# Patient Record
Sex: Male | Born: 1975 | Race: Black or African American | Hispanic: No | Smoking: Current every day smoker
Health system: Southern US, Community
[De-identification: ages and names within clinical notes are randomized; demographics above are authoritative.]

## PROBLEM LIST (undated history)

## (undated) DIAGNOSIS — I161 Hypertensive emergency: Secondary | ICD-10-CM

## (undated) DIAGNOSIS — R569 Unspecified convulsions: Secondary | ICD-10-CM

## (undated) DIAGNOSIS — I10 Essential (primary) hypertension: Secondary | ICD-10-CM

## (undated) DIAGNOSIS — I509 Heart failure, unspecified: Secondary | ICD-10-CM

## (undated) HISTORY — DX: Hypertensive emergency: I16.1

## (undated) HISTORY — DX: Heart failure, unspecified: I50.9

## (undated) NOTE — Telephone Encounter (Signed)
 Summary: Reschedule  Formatting of this note might be different from the original. LVM Req pt call back. Reschedule appt fromt 09/22/23 w/Dr. Gaylon to next avail Electronically signed by Loetta, Kodi at 08/18/2023  9:54 AM CDT

## (undated) NOTE — Result Encounter Note (Signed)
 Formatting of this note might be different from the original. Your TSH is low but T4 is normal we will keep monitoring on it during follow up visit.  Vitamin D is low- Take vitamin D 50,000 units one capsule by mouth every 7 days for 8 weeks.  Hemoglobin and red counts are mild low- keep visit for colonoscopy to rule out GI bleeding.  Blood counts, electrolytes, kidney function, blood sugar and cholesterol levels are within normal range. Continue current regimen of treatment. Electronically signed by Saintclair [place], NP at 04/28/2023  2:02 PM CDT

## (undated) NOTE — Progress Notes (Signed)
 Formatting of this note is different from the original. Images from the original note were not included. Patient ID: Joshua Mayo is a 16 y.o. male.  Assessment and Plan:   Joshua Mayo was seen today for establish care.  Diagnoses and all orders for this visit:  Encounter to establish care  Encounter for preventive health examination -     T4, Free; Future -     Thyroid  Stimulating Hormone; Future -     Vitamin D 25-Hydroxy; Future -     Hemoglobin A1c; Future -     Lipid Panel; Future -     Comprehensive Metabolic Panel; Future -     Complete Blood Count with Differential; Future -     Hepatitis C Antibody -     HIV 1/2 4th Generation; Future  Essential hypertension -     T4, Free; Future -     Thyroid  Stimulating Hormone; Future -     Hemoglobin A1c; Future -     Lipid Panel; Future -     Comprehensive Metabolic Panel; Future -     Complete Blood Count with Differential; Future -     Microalbumin w/Creat - Random; Future  Chronic congestive heart failure, unspecified heart failure type (HC Category) -     T4, Free; Future -     Thyroid  Stimulating Hormone; Future -     Hemoglobin A1c; Future -     Lipid Panel; Future -     Comprehensive Metabolic Panel; Future -     Complete Blood Count with Differential; Future -     Cardiology Referral  Depression with anxiety  Seizure disorder (HC Category)  Alcohol  use disorder  Screen for colon cancer -     Colonoscopy Referral  Wellness Visit New patient requiring medication refills and cardiology referral. Lab work needed for medication management. - Order lab work. - Provide referral to cardiology. - Schedule follow-up visit in 3 months.  Congestive Heart Failure Managed with carvedilol  and losartan . Requires cardiology follow-up for further management. - Provide referral to cardiology.  Hypertension Well-controlled with losartan , carvedilol , and potassium chloride .  Depression and Anxiety Managed with  escitalopram . No current symptoms.  Alcohol  Use Disorder Managed with Campral . Reduced consumption. Agreed to reduce medication dosage. - Reduce medication dosage to once a day. - Schedule follow-up visit in 3 months to reassess alcohol  use and medication needs.  Seizure Disorder Managed with levetiracetam . No recent seizures.  General Health Maintenance Due for colonoscopy. - Provide referral for colonoscopy.   PMSFSH reviewed  -  Handout(s) provided with written plan of care and discussed with emphasis on treatment plan, supportive care and s/sx to seek further medical/emergent care. Understanding verbalized  -  Medication use/effects reviewed. Understanding verbalized   - Call for worsening of symptoms or failure to improve  - Follow up pending labs/imaging.  Subjective:    History of Present Illness    Mr. Joshua Mayo, a new patient with a history of hypertension, congestive heart failure, depression, anxiety, alcohol  use disorder, and seizures, presents for establishing care, medication refills, and a referral to a cardiologist. He has recently moved to the area and is seeking a new primary care provider. He is currently on multiple medications including losartan  50mg  for hypertension, escitalopram  20mg  for depression and anxiety, carvedilol  25mg  twice daily for heart failure, potassium 10mEq daily, Cialis 5mg  daily, Campral  333mg  three times daily for alcohol  use disorder, and levetiracetam  500mg  twice daily for seizures. He has been on these medications for  approximately two years. He reports continued, albeit reduced, alcohol  consumption, with a few drinks on weekends. He has been diagnosed with heart failure for about four to five years and has been seeing a cardiologist for this condition. He has also been diagnosed with a seizure disorder for about one to two years.     No data to display      History reviewed. History reviewed.  No pertinent past medical history.  History  reviewed. No pertinent surgical history.  Social History   Tobacco Use   Smoking status: Every Day    Types: Cigarettes    Start date: 2000   Smokeless tobacco: Never  Substance Use Topics   Alcohol  use: Yes   Drug use: Never   History reviewed. No pertinent family history.  Current Outpatient Medications  Medication Sig   acamprosate  (Campral ) 333 MG enteric-coated tablet Take 666 mg by mouth 3 (three) times daily   carvedilol  (Coreg ) 25 MG tablet Take 25 mg by mouth 2 (two) times daily   escitalopram  (Lexapro ) 20 MG tablet Take 20 mg by mouth daily   levETIRAcetam  (Keppra ) 500 MG tablet Take 500 mg by mouth 2 (two) times daily   losartan  (Cozaar ) 50 MG tablet Take 50 mg by mouth daily   potassium chloride  (K-Dur) 10 MEQ controlled-release tablet Take 10 mEq by mouth daily   rosuvastatin  (Crestor ) 40 MG tablet Take 40 mg by mouth nightly   tadalafil (Cialis) 5 MG tablet Take 5 mg by mouth daily as needed for Erectile Dysfunction   Patient Active Problem List   Diagnosis Date Noted   Essential hypertension 04/27/2023   Depression with anxiety 04/27/2023   Seizure disorder (HC Category) 04/27/2023   Alcohol  use disorder 04/27/2023   Chronic congestive heart failure (HC Category) 04/27/2023   Objective:   BP 124/80 (BP Location: Right arm, Patient Position: Sitting)   Pulse 86   Resp 16   Ht 1.702 m (5' 7)   Wt 78 kg (172 lb)   SpO2 97%   BMI 26.94 kg/m   Physical Exam  Constitutional: He  is alert and is oriented to person, place, and time. He is well-developed and well-nourished.  HENT:  Head: Normocephalic and atraumatic.  Right Ear: External ear normal.  Left Ear: External ear normal.  Nose: Nose normal.  Mouth/Throat: Oropharynx is clear and moist.  Eyes: Pupils are equal, round, and reactive to light. Conjunctivae and EOM are normal. Right eye exhibits no discharge. Left eye exhibits no discharge.    Extraocular Movements: EOM normal.   Neck: Neck supple.   Cardiovascular: Normal rate, regular rhythm, normal heart sounds and intact distal pulses.  No murmur heard. Pulmonary/Chest: Effort normal and breath sounds normal. No respiratory distress.  Abdominal: Soft. Bowel sounds are normal. He exhibits no distension. There is no abdominal tenderness.  Musculoskeletal:        General: No tenderness or edema. Normal range of motion.     Cervical back: Normal range of motion.  Skin: Skin is warm. No rash noted. No erythema.  Psychiatric: He has a normal mood and affect. His behavior is normal. Judgment and thought content normal.  Nursing note and vitals reviewed.   Electronically signed by: Ashby Manas, NP Electronically signed by Manas Ashby, NP at 04/27/2023  8:59 AM CDT

---

## 2006-08-08 ENCOUNTER — Emergency Department: Payer: Self-pay | Admitting: Internal Medicine

## 2007-11-02 ENCOUNTER — Emergency Department: Payer: Self-pay | Admitting: Emergency Medicine

## 2008-07-18 ENCOUNTER — Ambulatory Visit: Payer: Self-pay | Admitting: Family Medicine

## 2015-12-30 ENCOUNTER — Telehealth: Payer: Self-pay

## 2015-12-30 NOTE — Telephone Encounter (Signed)
Patient's wife who is a patient here called and wanted to get patient re establish, his last visit with you was in 2013. Please call wife back with your decision. she knows you will be out of the office this week-aa [phone]-melissa Reder-wife

## 2016-01-02 NOTE — Telephone Encounter (Signed)
Appointment scheduled for 01/12/15@2 /MW

## 2016-01-02 NOTE — Telephone Encounter (Signed)
Please schedule.-aa 

## 2016-01-08 DIAGNOSIS — R0683 Snoring: Secondary | ICD-10-CM | POA: Insufficient documentation

## 2016-01-08 DIAGNOSIS — K219 Gastro-esophageal reflux disease without esophagitis: Secondary | ICD-10-CM | POA: Insufficient documentation

## 2016-01-08 DIAGNOSIS — K648 Other hemorrhoids: Secondary | ICD-10-CM | POA: Insufficient documentation

## 2016-01-12 ENCOUNTER — Ambulatory Visit (INDEPENDENT_AMBULATORY_CARE_PROVIDER_SITE_OTHER): Payer: BLUE CROSS/BLUE SHIELD | Admitting: Family Medicine

## 2016-01-12 ENCOUNTER — Encounter: Payer: Self-pay | Admitting: Family Medicine

## 2016-01-12 VITALS — BP 128/90 | HR 97 | Temp 98.5°F | Resp 16 | Ht 67.0 in | Wt 180.8 lb

## 2016-01-12 DIAGNOSIS — R0683 Snoring: Secondary | ICD-10-CM

## 2016-01-12 DIAGNOSIS — R945 Abnormal results of liver function studies: Secondary | ICD-10-CM | POA: Diagnosis not present

## 2016-01-12 DIAGNOSIS — R5383 Other fatigue: Secondary | ICD-10-CM

## 2016-01-12 DIAGNOSIS — Z0000 Encounter for general adult medical examination without abnormal findings: Secondary | ICD-10-CM | POA: Diagnosis not present

## 2016-01-12 DIAGNOSIS — K219 Gastro-esophageal reflux disease without esophagitis: Secondary | ICD-10-CM | POA: Diagnosis not present

## 2016-01-12 DIAGNOSIS — R5381 Other malaise: Secondary | ICD-10-CM | POA: Diagnosis not present

## 2016-01-12 NOTE — Progress Notes (Signed)
Patient: Joshua Mayo Male    DOB: 1975/10/04   41 y.o.   MRN: [id number] Visit Date: 01/12/2016  Today's Provider: Dortha Kernennis Ayjah Show, PA   Chief Complaint  Patient presents with  . Establish Care   Subjective:    HPI Joshua Mayo is a 41 year old male who presents today to Re-Establish Care as a new patient. Patient's only concerns is difficulty sleeping, night sweats, chills, and body aches. Onset 2 months of intermittent symptoms until last week.   No past medical history on file. Patient Active Problem List   Diagnosis Date Noted  . Esophageal reflux 01/08/2016  . Internal hemorrhoids without complication 01/08/2016  . Snoring 01/08/2016   No past surgical history on file. Family History  Problem Relation Age of Onset  . Heart disease Father   . Heart disease Paternal Grandfather    No Known Allergies   Previous Medications   CHLORHEXIDINE (PERIDEX) 0.12 % SOLUTION    USE TWICE A DAY AS DIRECTED   NAPROXEN SODIUM (ANAPROX) 550 MG TABLET    Take 550 mg by mouth 2 (two) times daily as needed.   TRIAZOLAM (HALCION) 0.25 MG TABLET    TAKE 1 TABLET BY MOUTH 1 HOUR PRIOR TO PROCEDURE-SAVE 2ND TAB FOR NEXT PROCEDURE    Review of Systems  Constitutional: Positive for chills and diaphoresis.  Eyes: Negative.   Respiratory: Negative.   Musculoskeletal: Positive for myalgias.    Social History  Substance Use Topics  . Smoking status: Current Every Day Smoker    Packs/day: 1.00    Years: 20.00    Types: Cigarettes  . Smokeless tobacco: Never Used  . Alcohol use Yes     Comment: weekends   Objective:   BP 128/90 (BP Location: Right Arm, Patient Position: Sitting, Cuff Size: Normal)   Pulse 97   Temp 98.5 F (36.9 C) (Oral)   Resp 16   Ht 5\' 7"  (1.702 m)   Wt 180 lb 12.8 oz (82 kg)   SpO2 99%   BMI 28.32 kg/m   Physical Exam  Constitutional: He is oriented to person, place, and time. He appears well-developed and well-nourished.  HENT:  Head: Normocephalic  and atraumatic.  Right Ear: External ear normal.  Left Ear: External ear normal.  Nose: Nose normal.  Mouth/Throat: Oropharynx is clear and moist.  Recent gum surgery has left him with swelling of the right lower jaw.  Eyes: Conjunctivae and EOM are normal. Pupils are equal, round, and reactive to light. Right eye exhibits no discharge.  Neck: Normal range of motion. Neck supple. No tracheal deviation present. No thyromegaly present.  Cardiovascular: Normal rate, regular rhythm, normal heart sounds and intact distal pulses.   No murmur heard. Pulmonary/Chest: Effort normal and breath sounds normal. No respiratory distress. He has no wheezes. He has no rales. He exhibits no tenderness.  Abdominal: Soft. He exhibits no distension and no mass. There is no tenderness. There is no rebound and no guarding.  Musculoskeletal: Normal range of motion. He exhibits no edema or tenderness.  Lymphadenopathy:    He has no cervical adenopathy.  Neurological: He is alert and oriented to person, place, and time. He has normal reflexes. No cranial nerve deficit. He exhibits normal muscle tone. Coordination normal.  Skin: Skin is warm and dry. No rash noted. No erythema.  Psychiatric: He has a normal mood and affect. His behavior is normal. Judgment and thought content normal.      Assessment & Plan:  1. Annual physical exam Other than recent malaise and fatigue, general health stable. Exam to re-establish care. Had recent oral surgery for gum disease. Declines tetanus booster. Check routine labs. - CBC with Differential/Platelet - Comprehensive metabolic panel - Lipid panel - TSH  2. Malaise and fatigue Onset over the past month with sweats, chills, body aches and fatigue. No fever today. Will get routine labs. Recheck pending reports. - CBC with Differential/Platelet - Comprehensive metabolic panel - Lipid panel - TSH  3. Gastroesophageal reflux disease, esophagitis presence not specified No  hematemesis or hematochezia. Continue to have dyspepsia that is relieved by daily use of Prilosec. May need further evaluation or upper endoscopy. Will check labs and follow up pending reports. Given counseling regarding reflux precautions. - CBC with Differential/Platelet - Comprehensive metabolic panel  4. Snoring States he has had snoring issue for a few years. Will give Epworth Sleepiness Scale questionnaire to take home and fill out with his wife. May need sleep study.

## 2016-01-13 ENCOUNTER — Telehealth: Payer: Self-pay

## 2016-01-13 LAB — LIPID PANEL
Chol/HDL Ratio: 3 ratio units (ref 0.0–5.0)
Cholesterol, Total: 232 mg/dL — ABNORMAL HIGH (ref 100–199)
HDL: 77 mg/dL (ref >39)
LDL Calculated: 144 mg/dL — ABNORMAL HIGH (ref 0–99)
Triglycerides: 56 mg/dL (ref 0–149)
VLDL CHOLESTEROL CAL: 11 mg/dL (ref 5–40)

## 2016-01-13 LAB — COMPREHENSIVE METABOLIC PANEL
A/G RATIO: 1.8 (ref 1.2–2.2)
ALT: 201 IU/L — ABNORMAL HIGH (ref 0–44)
AST: 85 IU/L — ABNORMAL HIGH (ref 0–40)
Albumin: 4.5 g/dL (ref 3.5–5.5)
Alkaline Phosphatase: 65 IU/L (ref 39–117)
BILIRUBIN TOTAL: 0.5 mg/dL (ref 0.0–1.2)
BUN / CREAT RATIO: 16 (ref 9–20)
BUN: 13 mg/dL (ref 6–24)
CALCIUM: 9.3 mg/dL (ref 8.7–10.2)
CO2: 21 mmol/L (ref 18–29)
Chloride: 98 mmol/L (ref 96–106)
Creatinine, Ser: 0.8 mg/dL (ref 0.76–1.27)
GFR calc non Af Amer: 110 mL/min/{1.73_m2} (ref >59)
GFR, EST AFRICAN AMERICAN: 127 mL/min/{1.73_m2} (ref >59)
Globulin, Total: 2.5 g/dL (ref 1.5–4.5)
Glucose: 69 mg/dL (ref 65–99)
POTASSIUM: 4.5 mmol/L (ref 3.5–5.2)
Sodium: 140 mmol/L (ref 134–144)
TOTAL PROTEIN: 7 g/dL (ref 6.0–8.5)

## 2016-01-13 LAB — CBC WITH DIFFERENTIAL/PLATELET
BASOS: 1 %
Basophils Absolute: 0.1 10*3/uL (ref 0.0–0.2)
EOS (ABSOLUTE): 0.1 10*3/uL (ref 0.0–0.4)
Eos: 1 %
HEMOGLOBIN: 13.4 g/dL (ref 13.0–17.7)
Hematocrit: 41.7 % (ref 37.5–51.0)
IMMATURE GRANS (ABS): 0 10*3/uL (ref 0.0–0.1)
IMMATURE GRANULOCYTES: 0 %
LYMPHS: 40 %
Lymphocytes Absolute: 2.3 10*3/uL (ref 0.7–3.1)
MCH: 28.2 pg (ref 26.6–33.0)
MCHC: 32.1 g/dL (ref 31.5–35.7)
MCV: 88 fL (ref 79–97)
MONOCYTES: 11 %
Monocytes Absolute: 0.6 10*3/uL (ref 0.1–0.9)
NEUTROS ABS: 2.7 10*3/uL (ref 1.4–7.0)
NEUTROS PCT: 47 %
Platelets: 266 10*3/uL (ref 150–379)
RBC: 4.75 x10E6/uL (ref 4.14–5.80)
RDW: 13.6 % (ref 12.3–15.4)
WBC: 5.8 10*3/uL (ref 3.4–10.8)

## 2016-01-13 LAB — TSH: TSH: 0.8 u[IU]/mL (ref 0.450–4.500)

## 2016-01-13 NOTE — Telephone Encounter (Signed)
Added on hepatitis panel. LM for Mr. Iona BeardHaith to call back. Allene DillonEmily Drozdowski, CMA

## 2016-01-13 NOTE — Telephone Encounter (Signed)
-----   Message from Tamsen Roersennis E Chrismon, [place] sent at 01/13/2016  8:51 AM EST ----- Cholesterol and liver enzymes elevated. Recommend low fat diet and ask lab to run hepatitis panel.

## 2016-01-13 NOTE — Telephone Encounter (Signed)
Advised pt of lab results. Pt verbally acknowledges understanding. [name], CMA   

## 2016-01-14 LAB — HEPATITIS PANEL, ACUTE
HEP A IGM: NEGATIVE
HEP B C IGM: NEGATIVE
HEP B S AG: NEGATIVE

## 2016-01-14 LAB — SPECIMEN STATUS REPORT

## 2016-07-30 ENCOUNTER — Encounter: Payer: Self-pay | Admitting: Family Medicine

## 2016-07-30 ENCOUNTER — Ambulatory Visit (INDEPENDENT_AMBULATORY_CARE_PROVIDER_SITE_OTHER): Payer: BLUE CROSS/BLUE SHIELD | Admitting: Family Medicine

## 2016-07-30 VITALS — BP 132/94 | HR 100 | Temp 98.6°F | Resp 16 | Wt 188.0 lb

## 2016-07-30 DIAGNOSIS — H6123 Impacted cerumen, bilateral: Secondary | ICD-10-CM

## 2016-07-30 MED ORDER — NEOMYCIN-POLYMYXIN-HC 3.5-10000-1 OT SUSP: 3.0000 [drp] | Freq: Three times a day (TID) | OTIC | 0 refills | Status: DC

## 2016-07-30 NOTE — Progress Notes (Signed)
Patient: Joshua Mayo Male    DOB: 04/06/1975   41 y.o.   MRN: [id number] Visit Date: 07/30/2016  Today's Provider: Dortha Kernennis Chrismon, PA   Chief Complaint  Patient presents with  . Ear Problem   Subjective:    HPI   Ear Problem Pt is c/o bilateral ear itchiness for the last month. His ears are also draining and are painful. He has tried OTC ear drops, without relief. He denies hearing loss, vertigo. He admits he uses pens, paperclips, q-tips to scratch ears.  Patient Active Problem List   Diagnosis Date Noted  . Esophageal reflux 01/08/2016  . Internal hemorrhoids without complication 01/08/2016  . Snoring 01/08/2016   History reviewed. No pertinent surgical history.  Family History  Problem Relation Age of Onset  . Heart disease Father   . Heart disease Paternal Grandfather    No Known Allergies  Current Outpatient Prescriptions:  .  omeprazole (PRILOSEC) 20 MG capsule, Take 20 mg by mouth daily., Disp: , Rfl:   Review of Systems  Constitutional: Negative for activity change, appetite change, chills, diaphoresis, fatigue, fever and unexpected weight change.  HENT: Positive for ear discharge and ear pain. Negative for tinnitus.     Social History  Substance Use Topics  . Smoking status: Current Every Day Smoker    Packs/day: 1.00    Years: 20.00    Types: Cigarettes  . Smokeless tobacco: Never Used  . Alcohol use Yes     Comment: weekends   Objective:   BP (!) 132/94 (BP Location: Right Arm, Patient Position: Sitting, Cuff Size: Normal)   Pulse 100   Temp 98.6 F (37 C) (Oral)   Resp 16   Wt 188 lb (85.3 kg)   BMI 29.44 kg/m  Vitals:   07/30/16 1606  BP: (!) 132/94  Pulse: 100  Resp: 16  Temp: 98.6 F (37 C)  TempSrc: Oral  Weight: 188 lb (85.3 kg)     Physical Exam  Constitutional: He is oriented to person, place, and time. He appears well-developed and well-nourished. No distress.  HENT:  Head: Normocephalic and atraumatic.    Right Ear: Hearing normal.  Left Ear: Hearing normal.  Nose: Nose normal.  Both ear canals have a large amount of cerumen. No drainage at the present and no pain or hearing loss.  Eyes: Conjunctivae and lids are normal. Right eye exhibits no discharge. Left eye exhibits no discharge. No scleral icterus.  Neck: Neck supple.  Cardiovascular: Normal rate and regular rhythm.   Pulmonary/Chest: Effort normal and breath sounds normal. No respiratory distress.  Abdominal: Soft.  Musculoskeletal: Normal range of motion.  Lymphadenopathy:    He has no cervical adenopathy.  Neurological: He is alert and oriented to person, place, and time.  Skin: Skin is intact. No lesion and no rash noted.  Psychiatric: He has a normal mood and affect. His speech is normal and behavior is normal. Thought content normal.      Assessment & Plan:     1. Bilateral impacted cerumen Itching sensation both ears over the past month. Large amount of cerumen in both ear canals. Irrigated clear of wax. Given Cortisporin Otic drops for irritation and itching for 4 days. Moderate amount of cerumen remains in the left ear canal. May have to irrigate again if not cleared by the first of the week.  - neomycin-polymyxin-hydrocortisone (CORTISPORIN) 3.5-10000-1 OTIC suspension; Place 3 drops into both ears 3 (three) times daily.  Dispense:  10 mL; Refill: 0 .        Dortha Kernennis Chrismon, PA  [facility]

## 2016-09-08 DIAGNOSIS — J019 Acute sinusitis, unspecified: Secondary | ICD-10-CM | POA: Diagnosis not present

## 2017-01-07 ENCOUNTER — Encounter: Payer: Self-pay | Admitting: Family Medicine

## 2017-01-07 ENCOUNTER — Ambulatory Visit: Payer: BLUE CROSS/BLUE SHIELD | Admitting: Family Medicine

## 2017-01-07 VITALS — BP 158/96 | HR 97 | Temp 98.4°F | Wt 187.4 lb

## 2017-01-07 DIAGNOSIS — M7021 Olecranon bursitis, right elbow: Secondary | ICD-10-CM

## 2017-01-07 MED ORDER — METHYLPREDNISOLONE ACETATE 40 MG/ML IJ SUSP: 40.0000 mg | Freq: Once | INTRAMUSCULAR | Status: DC

## 2017-01-07 MED ORDER — CEPHALEXIN 500 MG PO CAPS: 500.0000 mg | ORAL_CAPSULE | Freq: Two times a day (BID) | ORAL | 0 refills | Status: DC

## 2017-01-07 NOTE — Addendum Note (Signed)
Addended by: Dortha KernHRISMON, Will Heinkel E on: 01/07/2017 11:24 AM   Modules accepted: Orders

## 2017-01-07 NOTE — Addendum Note (Signed)
Addended by: Dortha KernHRISMON, DENNIS E on: 01/07/2017 05:16 PM   Modules accepted: Orders

## 2017-01-07 NOTE — Progress Notes (Signed)
Patient: Joshua Mayo Male    DOB: 1975/11/06   42 y.o.   MRN: [id number] Visit Date: 01/07/2017  Today's Provider: Dortha Kernennis Jahmeir Geisen, PA   Chief Complaint  Patient presents with  . Bursitis right elbow   Subjective:    HPI  Elbow Pain   The pain is present in the right elbow. Quality: soreness. Associated symptoms comments: swelling. Exacerbated by: fell approximately 2 months ago, and states he uses a computer all day which applies pressure to his elbows. Treatments tried: Patient states he attempted to drain fluid him self at home with a diabetic needle. The treatment provided no relief. He has a PMH of bursitis, but denies having fluid drained previously.   No past medical history on file. Patient Active Problem List   Diagnosis Date Noted  . Esophageal reflux 01/08/2016  . Internal hemorrhoids without complication 01/08/2016  . Snoring 01/08/2016   No past surgical history on file. Family History  Problem Relation Age of Onset  . Heart disease Father   . Heart disease Paternal Grandfather    No Known Allergies  Current Outpatient Medications:  .  omeprazole (PRILOSEC) 20 MG capsule, Take 20 mg by mouth daily., Disp: , Rfl:   Review of Systems  Constitutional: Negative.   Respiratory: Negative.   Cardiovascular: Negative.   Musculoskeletal:       Right elbow pain and swelling    Social History   Tobacco Use  . Smoking status: Current Every Day Smoker    Packs/day: 1.00    Years: 20.00    Pack years: 20.00    Types: Cigarettes  . Smokeless tobacco: Never Used  Substance Use Topics  . Alcohol use: Yes    Comment: weekends   Objective:   BP (!) 158/96 (BP Location: Right Arm, Patient Position: Sitting, Cuff Size: Normal)   Pulse 97   Temp 98.4 F (36.9 C) (Oral)   Wt 187 lb 6.4 oz (85 kg)   SpO2 98%   BMI 29.35 kg/m    Physical Exam  Constitutional: He is oriented to person, place, and time. He appears well-developed and well-nourished. No  distress.  HENT:  Head: Normocephalic and atraumatic.  Right Ear: Hearing normal.  Left Ear: Hearing normal.  Nose: Nose normal.  Eyes: Conjunctivae and lids are normal. Right eye exhibits no discharge. Left eye exhibits no discharge. No scleral icterus.  Pulmonary/Chest: Effort normal. No respiratory distress.  Musculoskeletal: Normal range of motion.  Large fluctuant right olecranon bursa with scab centrally. FROM of joints without significant discomfort.  Neurological: He is alert and oriented to person, place, and time.  Skin: Skin is intact. No lesion and no rash noted.  Psychiatric: He has a normal mood and affect. His speech is normal and behavior is normal. Thought content normal.      Assessment & Plan:     1. Olecranon bursitis of right elbow Larey SeatFell off a bed playing with daughter and landed on the right elbow a month ago. Swelling persisted and tried to use a diabetic insulin syringe to aspirate at home. Has a scab over the olecranon process with large swelling of the bursa. No significant discomfort today. No fevers. Joint aspiration with 18 gauge needle produced yellow fluid with pink tinge. Will send fluid to cytology and given Keflex 500 mg BID since he attempted aspiration at home. Recheck in 1 week. Used Coban compression bandage after aspiration to prevent recurrence of fluid collection. Instilled 40 mg  DepoMedrol to bursa. - Joint aspiration, elbow        Dortha Kern, PA  [facility]

## 2017-01-10 LAB — CYTOLOGY - NON PAP

## 2017-01-11 ENCOUNTER — Telehealth: Payer: Self-pay

## 2017-01-11 NOTE — Telephone Encounter (Signed)
Patient advised.

## 2017-01-11 NOTE — Telephone Encounter (Signed)
-----   Message from Tamsen Roersennis E Chrismon, [place] sent at 01/10/2017  4:45 PM EST ----- Cytology report of bursa fluid was negative for infection or malignant cells. Recheck 01-14-17 as planned.

## 2017-01-14 ENCOUNTER — Ambulatory Visit: Payer: BLUE CROSS/BLUE SHIELD | Admitting: Family Medicine

## 2017-01-14 ENCOUNTER — Encounter: Payer: Self-pay | Admitting: Family Medicine

## 2017-01-14 VITALS — BP 128/86 | HR 88 | Temp 98.4°F | Wt 183.6 lb

## 2017-01-14 DIAGNOSIS — M7021 Olecranon bursitis, right elbow: Secondary | ICD-10-CM

## 2017-01-14 NOTE — Progress Notes (Signed)
    Patient: Joshua Mayo Male    DOB: 1975-04-12   42 y.o.   MRN: [id number] Visit Date: 01/14/2017  Today's Provider: Dortha Kernennis Chrismon, PA   Chief Complaint  Patient presents with  . bursitis follow up   Subjective:    HPI Olecranon bursitis of right elbow: Patient presents for a 1 week follow up. Last OV was on 01/07/17. Right elbow was aspirated. Cytology report was negative for bacteria. Patient was advised to complete Keflex 500 mg BID. He reports good compliance with treatment plan. Symptoms have significantly improved. He states the Keflex is causing diarrhea.     No Known Allergies   Current Outpatient Medications:  .  cephALEXin (KEFLEX) 500 MG capsule, Take 1 capsule (500 mg total) by mouth 2 (two) times daily., Disp: 14 capsule, Rfl: 0 .  omeprazole (PRILOSEC) 20 MG capsule, Take 20 mg by mouth daily., Disp: , Rfl:   Current Facility-Administered Medications:  .  methylPREDNISolone acetate (DEPO-MEDROL) injection 40 mg, 40 mg, Intra-Lesional, Once, Chrismon, Jodell Ciproennis E, PA    Review of Systems  Constitutional: Negative.   Respiratory: Negative.   Cardiovascular: Negative.    Social History   Tobacco Use  . Smoking status: Current Every Day Smoker    Packs/day: 1.00    Years: 20.00    Pack years: 20.00    Types: Cigarettes  . Smokeless tobacco: Never Used  Substance Use Topics  . Alcohol use: Yes    Comment: weekends   Objective:   BP 128/86 (BP Location: Right Arm, Patient Position: Sitting, Cuff Size: Normal)   Pulse 88   Temp 98.4 F (36.9 C) (Oral)   Wt 183 lb 9.6 oz (83.3 kg)   SpO2 99%   BMI 28.76 kg/m    Physical Exam  Constitutional: He appears well-developed and well-nourished.  Musculoskeletal: Normal range of motion.  Minimal soreness remains over the right olecranon process. No swelling or redness. Full ROM and good grip strength. No lymphangitis.      Assessment & Plan:     1. Olecranon bursitis of right elbow Cytology report  negative for infection or malignant cells. Swelling has resolved. May stop the Keflex and recheck as needed.      Dortha Kernennis Chrismon, PA  [facility]

## 2017-03-18 DIAGNOSIS — H60333 Swimmer's ear, bilateral: Secondary | ICD-10-CM | POA: Diagnosis not present

## 2017-03-18 DIAGNOSIS — H6122 Impacted cerumen, left ear: Secondary | ICD-10-CM | POA: Diagnosis not present

## 2017-04-11 DIAGNOSIS — H60339 Swimmer's ear, unspecified ear: Secondary | ICD-10-CM | POA: Diagnosis not present

## 2017-05-13 DIAGNOSIS — H18823 Corneal disorder due to contact lens, bilateral: Secondary | ICD-10-CM | POA: Diagnosis not present

## 2017-05-24 DIAGNOSIS — H18211 Corneal edema secondary to contact lens, right eye: Secondary | ICD-10-CM | POA: Diagnosis not present

## 2017-09-13 DIAGNOSIS — L02811 Cutaneous abscess of head [any part, except face]: Secondary | ICD-10-CM | POA: Diagnosis not present

## 2017-10-12 DIAGNOSIS — J312 Chronic pharyngitis: Secondary | ICD-10-CM | POA: Diagnosis not present

## 2017-10-29 ENCOUNTER — Emergency Department
Admission: EM | Admit: 2017-10-29 | Discharge: 2017-10-30 | Disposition: A | Discharge status: Home / Self Care | Payer: BLUE CROSS/BLUE SHIELD | Attending: Emergency Medicine | Admitting: Emergency Medicine

## 2017-10-29 ENCOUNTER — Encounter: Payer: Self-pay | Admitting: Emergency Medicine

## 2017-10-29 ENCOUNTER — Emergency Department: Payer: BLUE CROSS/BLUE SHIELD

## 2017-10-29 DIAGNOSIS — K760 Fatty (change of) liver, not elsewhere classified: Secondary | ICD-10-CM | POA: Diagnosis not present

## 2017-10-29 DIAGNOSIS — R109 Unspecified abdominal pain: Secondary | ICD-10-CM | POA: Diagnosis not present

## 2017-10-29 DIAGNOSIS — R1031 Right lower quadrant pain: Secondary | ICD-10-CM | POA: Diagnosis not present

## 2017-10-29 DIAGNOSIS — F17210 Nicotine dependence, cigarettes, uncomplicated: Secondary | ICD-10-CM | POA: Diagnosis not present

## 2017-10-29 DIAGNOSIS — K5730 Diverticulosis of large intestine without perforation or abscess without bleeding: Secondary | ICD-10-CM | POA: Diagnosis not present

## 2017-10-29 DIAGNOSIS — Z79899 Other long term (current) drug therapy: Secondary | ICD-10-CM | POA: Diagnosis not present

## 2017-10-29 LAB — CBC
HEMATOCRIT: 42.9 % (ref 39.0–52.0)
HEMOGLOBIN: 14.1 g/dL (ref 13.0–17.0)
MCH: 29.7 pg (ref 26.0–34.0)
MCHC: 32.9 g/dL (ref 30.0–36.0)
MCV: 90.3 fL (ref 80.0–100.0)
NRBC: 0 % (ref 0.0–0.2)
PLATELETS: 210 10*3/uL (ref 150–400)
RBC: 4.75 MIL/uL (ref 4.22–5.81)
RDW: 12.4 % (ref 11.5–15.5)
WBC: 6.7 10*3/uL (ref 4.0–10.5)

## 2017-10-29 LAB — TROPONIN I: Troponin I: <0.03 ng/mL (ref <0.03)

## 2017-10-29 LAB — COMPREHENSIVE METABOLIC PANEL
ALBUMIN: 4.9 g/dL (ref 3.5–5.0)
ALT: 138 U/L — AB (ref 0–44)
AST: 89 U/L — ABNORMAL HIGH (ref 15–41)
Alkaline Phosphatase: 56 U/L (ref 38–126)
Anion gap: 16 — ABNORMAL HIGH (ref 5–15)
BUN: 12 mg/dL (ref 6–20)
CALCIUM: 9.9 mg/dL (ref 8.9–10.3)
CHLORIDE: 104 mmol/L (ref 98–111)
CO2: 22 mmol/L (ref 22–32)
CREATININE: 1.16 mg/dL (ref 0.61–1.24)
GFR calc Af Amer: >60 mL/min (ref >60)
GFR calc non Af Amer: >60 mL/min (ref >60)
GLUCOSE: 109 mg/dL — AB (ref 70–99)
Potassium: 3.8 mmol/L (ref 3.5–5.1)
SODIUM: 142 mmol/L (ref 135–145)
Total Bilirubin: 0.7 mg/dL (ref 0.3–1.2)
Total Protein: 8.4 g/dL — ABNORMAL HIGH (ref 6.5–8.1)

## 2017-10-29 LAB — ETHANOL: Alcohol, Ethyl (B): 25 mg/dL — ABNORMAL HIGH (ref <10)

## 2017-10-29 LAB — LIPASE, BLOOD: LIPASE: 40 U/L (ref 11–51)

## 2017-10-29 MED ORDER — FENTANYL CITRATE (PF) 100 MCG/2ML IJ SOLN
50.0000 ug | INTRAMUSCULAR | Status: DC | PRN
  Administered 2017-10-29: 50 ug via INTRAVENOUS
  Filled 2017-10-29: qty 2

## 2017-10-29 MED ORDER — CYCLOBENZAPRINE HCL 5 MG PO TABS: 5.0000 mg | ORAL_TABLET | Freq: Three times a day (TID) | ORAL | 0 refills | Status: DC | PRN

## 2017-10-29 MED ORDER — IOPAMIDOL (ISOVUE-370) INJECTION 76%
100.0000 mL | Freq: Once | INTRAVENOUS | Status: AC | PRN
  Administered 2017-10-29: 100 mL via INTRAVENOUS

## 2017-10-29 MED ORDER — ONDANSETRON HCL 4 MG/2ML IJ SOLN
4.0000 mg | Freq: Once | INTRAMUSCULAR | Status: AC
  Administered 2017-10-29: 4 mg via INTRAVENOUS
  Filled 2017-10-29: qty 2

## 2017-10-29 MED ORDER — MORPHINE SULFATE (PF) 4 MG/ML IV SOLN
4.0000 mg | Freq: Once | INTRAVENOUS | Status: AC
  Administered 2017-10-29: 4 mg via INTRAVENOUS
  Filled 2017-10-29: qty 1

## 2017-10-29 MED ORDER — SODIUM CHLORIDE 0.9 % IV BOLUS
1000.0000 mL | Freq: Once | INTRAVENOUS | Status: AC
  Administered 2017-10-29: 1000 mL via INTRAVENOUS

## 2017-10-29 NOTE — ED Notes (Signed)
ED Provider at bedside. 

## 2017-10-29 NOTE — ED Notes (Signed)
Patient returned from CT

## 2017-10-29 NOTE — Discharge Instructions (Signed)

## 2017-10-29 NOTE — ED Triage Notes (Addendum)
Pt arrives with complaints of gradual onset of lower right abdominal pain that started roughly 1 hour prior to arrival. Pt states the pain feels like throbbing. Pt very uncomfortable in triage. Pt denies and chest pain or shortness of breath

## 2017-10-29 NOTE — ED Provider Notes (Signed)
[facility] Emergency Department Provider Note  ____________________________________________  Time seen: Approximately 9:46 PM  I have reviewed the triage vital signs and the nursing notes.   HISTORY  Chief Complaint Abdominal Pain   HPI Joshua Mayo is a 42 y.o. male with history of alcohol abuse and GERD who presents for evaluation of abdominal pain.  He reports that the pain started mild this morning located in his right flank and throughout the day the pain got progressively worse and now radiates down his groin and his abdomen as well.  The pain is sharp and constant, associated with nausea but no vomiting, currently 7/10.  Pain is worse with movement of torso, stretching of the R leg.  No fever or chills, no dysuria or hematuria.  No prior history of kidney stones.  Patient drinks 1/5 of liquor a night for several years.  No known history of cirrhosis.  Last drink according to patient was last night.  Denies drinking today.   Patient Active Problem List   Diagnosis Date Noted  . Esophageal reflux 01/08/2016  . Internal hemorrhoids without complication 45/03/8880  . Snoring 01/08/2016    History reviewed. No pertinent surgical history.  Prior to Admission medications   Medication Sig Start Date End Date Taking? Authorizing Provider  cephALEXin (KEFLEX) 500 MG capsule Take 1 capsule (500 mg total) by mouth 2 (two) times daily. 01/07/17   Chrismon, Vickki Muff, PA  cyclobenzaprine (FLEXERIL) 5 MG tablet Take 1 tablet (5 mg total) by mouth 3 (three) times daily as needed for muscle spasms. 10/29/17   Alfred Levins, [place], MD  omeprazole (PRILOSEC) 20 MG capsule Take 20 mg by mouth daily.    [provider]    Allergies Patient has no known allergies.  Family History  Problem Relation Age of Onset  . Heart disease Father   . Heart disease Paternal Grandfather     Social History Social History   Tobacco Use  . Smoking status: Current  Every Day Smoker    Packs/day: 1.00    Years: 20.00    Pack years: 20.00    Types: Cigarettes  . Smokeless tobacco: Never Used  Substance Use Topics  . Alcohol use: Yes    Comment: weekends  . Drug use: No    Types: Cocaine    Comment: previously used cocaine. Pt states he stopped on his own with the help of his current wife     Review of Systems  Constitutional: Negative for fever. Eyes: Negative for visual changes. ENT: Negative for sore throat. Neck: No neck pain  Cardiovascular: Negative for chest pain. Respiratory: Negative for shortness of breath. Gastrointestinal: + R sided abdominal pain and nausea. No vomiting or diarrhea. Genitourinary: Negative for dysuria. + R flank pain Musculoskeletal: Negative for back pain. Skin: Negative for rash. Neurological: Negative for headaches, weakness or numbness. Psych: No SI or HI  ____________________________________________   PHYSICAL EXAM:  VITAL SIGNS: ED Triage Vitals  Enc Vitals Group     BP 10/29/17 1613 (!) 190/116     Pulse Rate 10/29/17 1613 (!) 122     Resp 10/29/17 1613 18     Temp 10/29/17 1613 97.6 F (36.4 C)     Temp Source 10/29/17 1613 Oral     SpO2 10/29/17 1613 100 %     Weight 10/29/17 1610 185 lb (83.9 kg)     Height 10/29/17 1610 '5\' 7"'  (1.702 m)     Head Circumference --  Peak Flow --      Pain Score 10/29/17 1610 10     Pain Loc --      Pain Edu? --      Excl. in [place]? --     Constitutional: Alert and oriented, patient is shaking, looks in significant distress due to pain. HEENT:      Head: Normocephalic and atraumatic.         Eyes: Conjunctivae are normal. Sclera is non-icteric.       Mouth/Throat: Mucous membranes are moist.       Neck: Supple with no signs of meningismus. Cardiovascular: Tachycardic with regular rhythm. No murmurs, gallops, or rubs. 2+ symmetrical distal pulses are present in all extremities. No JVD. Respiratory: Normal respiratory effort. Lungs are clear to  auscultation bilaterally. No wheezes, crackles, or rhonchi.  Gastrointestinal: Soft, non tender, and non distended with positive bowel sounds. No rebound or guarding. Genitourinary: No CVA tenderness. Musculoskeletal: Nontender with normal range of motion in all extremities. No edema, cyanosis, or erythema of extremities. Neurologic: Normal speech and language. Face is symmetric. Moving all extremities. No gross focal neurologic deficits are appreciated. Skin: Skin is warm, dry and intact. No rash noted. Psychiatric: Mood and affect are normal. Speech and behavior are normal.  ____________________________________________   LABS (all labs ordered are listed, but only abnormal results are displayed)  Labs Reviewed  COMPREHENSIVE METABOLIC PANEL - Abnormal; Notable for the following components:      Result Value   Glucose, Bld 109 (*)    Total Protein 8.4 (*)    AST 89 (*)    ALT 138 (*)    Anion gap 16 (*)    All other components within normal limits  ETHANOL - Abnormal; Notable for the following components:   Alcohol, Ethyl (B) 25 (*)    All other components within normal limits  LIPASE, BLOOD  CBC  TROPONIN I  URINALYSIS, COMPLETE (UACMP) WITH MICROSCOPIC   ____________________________________________  EKG  ED ECG REPORT I, Rudene Re, the attending physician, personally viewed and interpreted this ECG.  Sinus tachycardia, rate of 117, LVH, normal intervals, normal axis, T wave inversions in inferior and lateral leads with no ST elevation.  No Significant changes when compared to prior from 2009 ____________________________________________  RADIOLOGY  I have personally reviewed the images performed during this visit and I agree with the Radiologist's read.   Interpretation by Radiologist:  Ct Angio Abd/pel W And/or Wo Contrast  Result Date: 10/29/2017 CLINICAL DATA:  Complaint of gradual onset of lower right abdominal pain starting 1 hour prior to arrival. Pain  felt like throbbing. EXAM: CTA ABDOMEN AND PELVIS wITHOUT AND WITH CONTRAST TECHNIQUE: Multidetector CT imaging of the abdomen and pelvis was performed using the standard protocol during bolus administration of intravenous contrast. Multiplanar reconstructed images and MIPs were obtained and reviewed to evaluate the vascular anatomy. CONTRAST:  120m ISOVUE-370 IOPAMIDOL (ISOVUE-370) INJECTION 76% COMPARISON:  11/02/2007 CT FINDINGS: VASCULAR Aorta: Normal caliber aorta without aneurysm, dissection, vasculitis or significant stenosis. Celiac: Patent without evidence of aneurysm, dissection, vasculitis or significant stenosis. SMA: Patent without evidence of aneurysm, dissection, vasculitis or significant stenosis. Renals: Both renal arteries are patent without evidence of aneurysm, dissection, vasculitis, fibromuscular dysplasia or significant stenosis. IMA: Patent without evidence of aneurysm, dissection, vasculitis or significant stenosis. Inflow: Patent without evidence of aneurysm, dissection, vasculitis or significant stenosis. Proximal Outflow: Bilateral common femoral and visualized portions of the superficial and profunda femoral arteries are patent without evidence of aneurysm,  dissection, vasculitis or significant stenosis. Veins: Patent hepatic, portal and splenic veins. IVC is unremarkable. Review of the MIP images confirms the above findings. NON-VASCULAR Lower chest: Dependent bibasilar atelectasis. Top-normal size heart without pericardial effusion. Small hiatal hernia. Hepatobiliary: Mild steatosis of the liver. No space-occupying mass. No biliary dilatation. The gallbladder is normal. Pancreas: Normal Spleen: Normal Adrenals/Urinary Tract: Adrenal glands are unremarkable. Kidneys are normal, without renal calculi, focal lesion, or hydronephrosis. Bladder is unremarkable. Stomach/Bowel: Stomach is within normal limits. Appendix appears normal. Scattered colonic diverticulosis along the descending  sigmoid colon without acute diverticulitis. No evidence of bowel wall thickening, distention, or inflammatory changes. Lymphatic: No lymphadenopathy by CT size criteria. Reproductive: Mild prostatomegaly. Fat containing right inguinal hernia. Other: No free air or free fluid. Musculoskeletal: No acute or significant osseous findings. IMPRESSION: VASCULAR Nonaneurysmal abdominal aorta with patent branch vessels. No significant stenosis, thrombosis or dissection. NON-VASCULAR 1. Hepatic steatosis. 2. Colonic diverticulosis without acute diverticulitis. 3. Mild prostatomegaly. 4. Fat containing right inguinal hernia. Electronically Signed   By: Ashley Royalty M.D.   On: 10/29/2017 23:05      ____________________________________________   PROCEDURES  Procedure(s) performed: None Procedures Critical Care performed:  None ____________________________________________   INITIAL IMPRESSION / ASSESSMENT AND PLAN / ED COURSE  42 y.o. male with history of alcohol abuse and GERD who presents for evaluation of abdominal pain, flank pain, nausea since this am.  Patient is currently hypertensive, tachycardic, shaking uncontrollably.  Has a history of alcohol abuse.  His abdominal tenderness is not reproducible on palpation but it is brought on by patient sitting up or moving on the bed. Pain out of proportion to exam.   Differential diagnosis including withdrawal, dissection, AAA, kidney stone, or MSK pain. Labs showing transaminitis with LFTs in the low 100s, anion gap of 16, no leukocytosis, normal lipase, T bili and alk phos, and alcohol level of 25. Will give IVF, morphine, and zofran. CTA ordered. If negative will treat with IV ativan for possible withdrawals.    _________________________ 11:32 PM on 10/29/2017 -----------------------------------------  CTA of a/p negative for any vascular or non vascular acute findings. Discussed risks of alcohol, abuse and provided counseling for cessation to patient and  his wife. AT this time pain is most likely MSK in nature. Will provide muscle relaxants, recommended ibuprofen and close f/u with PCP. Dicussed standard return precautions with patient.    As part of my medical decision making, I reviewed the following data within the [facility] notes reviewed and incorporated, Labs reviewed , EKG interpreted , Old EKG reviewed, Old chart reviewed, Radiograph reviewed  Notes from prior ED visits and [place] Controlled Substance Database    Pertinent labs & imaging results that were available during my care of the patient were reviewed by me and considered in my medical decision making (see chart for details).    ____________________________________________   FINAL CLINICAL IMPRESSION(S) / ED DIAGNOSES  Final diagnoses:  Right flank pain      NEW MEDICATIONS STARTED DURING THIS VISIT:  ED Discharge Orders         Ordered    cyclobenzaprine (FLEXERIL) 5 MG tablet  3 times daily PRN     10/29/17 2337           Note:  This document was prepared using Dragon voice recognition software and may include unintentional dictation errors.    Rudene Re, MD 10/29/17 2337

## 2017-10-29 NOTE — ED Notes (Signed)
Patient c/o LRQ abdominal pain, nausea, loose stool, and decreased urination beginning at 1500 today.

## 2017-10-30 NOTE — ED Notes (Signed)
Reviewed discharge instructions, follow-up care, and prescriptions with patient. Patient verbalized understanding of all information reviewed. Patient stable, with no distress noted at this time.    

## 2017-11-01 ENCOUNTER — Other Ambulatory Visit: Payer: Self-pay

## 2017-11-01 ENCOUNTER — Ambulatory Visit: Payer: BLUE CROSS/BLUE SHIELD | Admitting: Family Medicine

## 2017-11-01 ENCOUNTER — Encounter: Payer: Self-pay | Admitting: Family Medicine

## 2017-11-01 VITALS — BP 126/92 | HR 77 | Temp 98.1°F | Ht 67.0 in | Wt 186.8 lb

## 2017-11-01 DIAGNOSIS — R1031 Right lower quadrant pain: Secondary | ICD-10-CM | POA: Diagnosis not present

## 2017-11-01 DIAGNOSIS — K2921 Alcoholic gastritis with bleeding: Secondary | ICD-10-CM

## 2017-11-01 DIAGNOSIS — N400 Enlarged prostate without lower urinary tract symptoms: Secondary | ICD-10-CM | POA: Diagnosis not present

## 2017-11-01 LAB — POCT URINALYSIS DIPSTICK
BILIRUBIN UA: NEGATIVE
GLUCOSE UA: NEGATIVE
KETONES UA: NEGATIVE
Leukocytes, UA: NEGATIVE
Nitrite, UA: NEGATIVE
Protein, UA: NEGATIVE
RBC UA: NEGATIVE
SPEC GRAV UA: 1.02 (ref 1.010–1.025)
UROBILINOGEN UA: 0.2 U/dL
pH, UA: 5 (ref 5.0–8.0)

## 2017-11-01 NOTE — Progress Notes (Signed)
Patient: Joshua Mayo Male    DOB: 1975-12-16   42 y.o.   MRN: [id number] Visit Date: 11/01/2017  Today's Provider: Vernie Murders, PA   Chief Complaint  Patient presents with  . Hospitalization Follow-up    seen at [facility] on 10/29/17 for lower right quadrant pain  . urine frequency   Subjective:    HPI  Pt reports that he is here to be seen for a hospital follow up.  Went to the [facility] on 10/29/17 for stomach pain at right lower quadrant and went into right leg and was hard for pt to walk.  Pt also reports he has heart palpitation, SOB, and isn't sleeping well at night.  This started a few weeks ago.  Pt was told at hospital that he has a large prostate.     Patient Active Problem List   Diagnosis Date Noted  . Esophageal reflux 01/08/2016  . Internal hemorrhoids without complication 81/84/0375  . Snoring 01/08/2016   History reviewed. No pertinent surgical history.   Family History  Problem Relation Age of Onset  . Heart disease Father   . Heart disease Paternal Grandfather    No Known Allergies  Current Outpatient Medications:  .  cyclobenzaprine (FLEXERIL) 5 MG tablet, Take 1 tablet (5 mg total) by mouth 3 (three) times daily as needed for muscle spasms., Disp: 30 tablet, Rfl: 0 .  omeprazole (PRILOSEC) 20 MG capsule, Take 20 mg by mouth daily., Disp: , Rfl:  .  cephALEXin (KEFLEX) 500 MG capsule, Take 1 capsule (500 mg total) by mouth 2 (two) times daily. (Patient not taking: Reported on 11/01/2017), Disp: 14 capsule, Rfl: 0  Current Facility-Administered Medications:  .  methylPREDNISolone acetate (DEPO-MEDROL) injection 40 mg, 40 mg, Intra-Lesional, Once, Nica Friske, Vickki Muff, PA  Review of Systems  Constitutional: Negative.   HENT: Negative.   Eyes: Negative.   Respiratory: Positive for shortness of breath. Negative for apnea, cough, choking, chest tightness, wheezing and stridor.   Cardiovascular: Negative.   Gastrointestinal: Negative.   Endocrine:  Negative.   Genitourinary: Positive for frequency. Negative for decreased urine volume, difficulty urinating, discharge, dysuria, enuresis, flank pain, genital sores, hematuria, penile pain, penile swelling, scrotal swelling, testicular pain and urgency.  Musculoskeletal: Negative.   Skin: Negative.   Allergic/Immunologic: Negative.   Neurological: Negative.   Hematological: Negative.   Psychiatric/Behavioral: Positive for sleep disturbance. Negative for agitation, behavioral problems, confusion, decreased concentration, dysphoric mood, hallucinations, self-injury and suicidal ideas. The patient is nervous/anxious. The patient is not hyperactive.    Social History   Tobacco Use  . Smoking status: Current Every Day Smoker    Packs/day: 1.00    Years: 20.00    Pack years: 20.00    Types: Cigarettes  . Smokeless tobacco: Never Used  Substance Use Topics  . Alcohol use: Yes    Comment: weekends   Objective:   BP (!) 126/92 (BP Location: Right Arm, Patient Position: Sitting, Cuff Size: Normal)   Pulse 77   Temp 98.1 F (36.7 C) (Oral)   Ht '5\' 7"'  (1.702 m)   Wt 186 lb 12.8 oz (84.7 kg)   SpO2 99%   BMI 29.26 kg/m  Vitals:   11/01/17 1105  BP: (!) 126/92  Pulse: 77  Temp: 98.1 F (36.7 C)  TempSrc: Oral  SpO2: 99%  Weight: 186 lb 12.8 oz (84.7 kg)  Height: '5\' 7"'  (1.702 m)   Physical Exam  Constitutional: He is oriented to person,  place, and time. He appears well-developed and well-nourished. No distress.  HENT:  Head: Normocephalic and atraumatic.  Right Ear: Hearing normal.  Left Ear: Hearing normal.  Nose: Nose normal.  Eyes: Conjunctivae and lids are normal. Right eye exhibits no discharge. Left eye exhibits no discharge. No scleral icterus.  Cardiovascular: Normal rate and regular rhythm.  Pulmonary/Chest: Effort normal and breath sounds normal. No respiratory distress.  Abdominal: Soft. Bowel sounds are normal.  Minimal tenderness in right groin with questionable  small inguinal hernia. No CVA tenderness to percussion.  Genitourinary:  Genitourinary Comments: Enlargement of prostate with questionable nodule. Stool positive hemoccult slide.  Musculoskeletal: Normal range of motion.  Neurological: He is alert and oriented to person, place, and time.  Skin: Skin is intact. No lesion and no rash noted.  Psychiatric: He has a normal mood and affect. His speech is normal and behavior is normal. Thought content normal.      Assessment & Plan:     1. RLQ abdominal pain Developed right lower back and abdomen pain on 10-27-17 and went to the ER. Labs showed some elevation of AST and ALT. Denies hematemesis or hematochezia. CT scan negative for renal stone. Was treated for musculoskeletal strain with Cyclobenzaprine and having less discomfort in the back today. Some prominence of right inguinal canal without demonstrable hernia. Urinalysis negative for blood or crystals today. Increase fluid intake and may need to schedule referral to a surgeon if inguinal discomfort occurs. - POCT Urinalysis Dipstick  2. Alcoholic gastritis with hemorrhage, unspecified chronicity Has a history of alcohol abuse and GERD. Remembers having a 5th of alcohol with his wife 2 hours prior going to the ER. Has a habit of drinking this much alcohol 6 days a week. Has had heartburn, indigestion, reflux and diarrhea with some "tarry stools" the past 6 months. Omeprazole taken intermittently has helped in the past. Recommend he taper off the alcohol and continue the Omeprazole 20-40 mg qd. Hemoccult test questionably positive and will give OC-Light kit to test at home. If positive, will need referral to gastroenterologist. CT scan normal except hepatic steatosis and mild prostate enlargement with right inguinal hernia (asymptomatic).  3. Prostate enlargement Enlargement of prostate without specific nodule noted on DRE. Will check PSA. - PSA       Vernie Murders, PA  [facility]

## 2017-11-01 NOTE — Patient Instructions (Signed)
Gastritis, Adult Gastritis is inflammation of the stomach. There are two kinds of gastritis:  Acute gastritis. This kind develops suddenly.  Chronic gastritis. This kind lasts for a long time.  Gastritis happens when the lining of the stomach becomes weak or gets damaged. Without treatment, gastritis can lead to stomach bleeding and ulcers. What are the causes? This condition may be caused by:  An infection.  Drinking too much alcohol.  Certain medicines.  Having too much acid in the stomach.  A disease of the intestines or stomach.  Stress.  What are the signs or symptoms? Symptoms of this condition include:  Pain or a burning in the upper abdomen.  Nausea.  Vomiting.  An uncomfortable feeling of fullness after eating.  In some cases, there are no symptoms. How is this diagnosed? This condition may be diagnosed with:  A description of your symptoms.  A physical exam.  Tests. These can include: ? Blood tests. ? Stool tests. ? A test in which a thin, flexible instrument with a light and camera on the end is passed down the esophagus and into the stomach (upper endoscopy). ? A test in which a sample of tissue is taken for testing (biopsy).  How is this treated? This condition may be treated with medicines. If the condition is caused by a bacterial infection, you may be given antibiotic medicines. If it is caused by too much acid in the stomach, you may get medicines called H2 blockers, proton pump inhibitors, or antacids. Treatment may also involve stopping the use of certain medicines, such as aspirin, ibuprofen, or other nonsteroidal anti-inflammatory drugs (NSAIDs). Follow these instructions at home:  Take over-the-counter and prescription medicines only as told by your health care provider.  If you were prescribed an antibiotic, take it as told by your health care provider. Do not stop taking the antibiotic even if you start to feel better.  Drink enough  fluid to keep your urine clear or pale yellow.  Eat small, frequent meals instead of large meals. Contact a health care provider if:  Your symptoms get worse.  Your symptoms return after treatment. Get help right away if:  You vomit blood or material that looks like coffee grounds.  You have black or dark red stools.  You are unable to keep fluids down.  Your abdominal pain gets worse.  You have a fever.  You do not feel better after 1 week. This information is not intended to replace advice given to you by your health care provider. Make sure you discuss any questions you have with your health care provider. Document Released: 12/15/2000 Document Revised: 08/20/2015 Document Reviewed: 09/14/2014 Elsevier Interactive Patient Education  2018 [employer]  

## 2017-11-02 LAB — PSA: Prostate Specific Ag, Serum: 1 ng/mL (ref 0.0–4.0)

## 2017-11-24 DIAGNOSIS — H43813 Vitreous degeneration, bilateral: Secondary | ICD-10-CM | POA: Diagnosis not present

## 2017-11-30 DIAGNOSIS — H2000 Unspecified acute and subacute iridocyclitis: Secondary | ICD-10-CM | POA: Diagnosis not present

## 2018-05-05 ENCOUNTER — Ambulatory Visit (INDEPENDENT_AMBULATORY_CARE_PROVIDER_SITE_OTHER): Payer: BLUE CROSS/BLUE SHIELD | Admitting: Family Medicine

## 2018-05-05 ENCOUNTER — Other Ambulatory Visit: Payer: Self-pay

## 2018-05-05 ENCOUNTER — Encounter: Payer: Self-pay | Admitting: Family Medicine

## 2018-05-05 DIAGNOSIS — R059 Cough, unspecified: Secondary | ICD-10-CM

## 2018-05-05 DIAGNOSIS — R5381 Other malaise: Secondary | ICD-10-CM | POA: Diagnosis not present

## 2018-05-05 DIAGNOSIS — R0602 Shortness of breath: Secondary | ICD-10-CM | POA: Diagnosis not present

## 2018-05-05 DIAGNOSIS — R05 Cough: Secondary | ICD-10-CM

## 2018-05-05 MED ORDER — AZITHROMYCIN 250 MG PO TABS: ORAL_TABLET | ORAL | 0 refills | Status: DC

## 2018-05-05 MED ORDER — PREDNISONE 5 MG PO TABS: ORAL_TABLET | ORAL | 0 refills | Status: DC

## 2018-05-05 NOTE — Progress Notes (Signed)
Patient: Joshua Mayo Levit Male    DOB: 01-May-1975   43 y.Mayo.   MRN: [id number] Visit Date: 05/05/2018  Today's Provider: Dortha Kernennis Chrismon, PA    Subjective:    Virtual Visit via Video Note  I connected with Joshua Mayo Markell on 05/05/18 at 11:00 AM EDT by a video enabled telemedicine application and verified that I am speaking with the correct person using two identifiers.  I discussed the limitations of evaluation and management by telemedicine and the availability of in person appointments. The patient expressed understanding and agreed to proceed.  History of Present Illness: This 43 year old male develop cough and congestion over the past 2 weeks. Has not had any fever but complains of sweats, clammy hands, weakness, dizziness and shortness of breath. Some pressure ache in chest occasionally. Some relief from use of Mucinex and Sudafed. Not smoking the past few days due to symptoms. 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  No past medical history on file.   Patient Active Problem List   Diagnosis Date Noted  . Esophageal reflux 01/08/2016  . Internal hemorrhoids without complication 01/08/2016  . Snoring 01/08/2016   No past surgical history on file.   Family History  Problem Relation Age of Onset  . Heart disease Father   . Heart disease Paternal Grandfather    No Known Allergies  Current Outpatient Medications:  .  cyclobenzaprine (FLEXERIL) 5 MG tablet, Take 1 tablet (5 mg total) by mouth 3 (three) times daily as needed for muscle spasms., Disp: 30 tablet, Rfl: 0 .  omeprazole (PRILOSEC) 20 MG capsule, Take 20 mg by mouth daily., Disp: , Rfl:   Review of Systems  Constitutional: Negative for fever.  HENT: Positive for congestion. Negative for sinus pain, sneezing and sore throat.   Eyes: Negative.   Respiratory: Positive for cough, chest tightness and shortness of breath.   Cardiovascular: Negative.    Gastrointestinal: Negative.   Genitourinary: Negative.   Musculoskeletal: Negative.   Neurological: Positive for dizziness.   Social History   Tobacco Use  . Smoking status: Current Every Day Smoker    Packs/day: 1.00    Years: 20.00    Pack years: 20.00    Types: Cigarettes  . Smokeless tobacco: Never Used  Substance Use Topics  . Alcohol use: Yes    Comment: weekends     Objective:   There were no vitals taken for this visit. There were no vitals filed for this visit.  WDWN male in no apparent distress.  Head: Normocephalic, atraumatic. Neck: Supple, NROM Respiratory: No apparent distress Psych: Normal mood and affect    Assessment & Plan     1. Cough Onset with shortness of breath, occasional mild sputum production but no fever. Some sweats and weakness. Does not appear significantly ill during video interview. No exposure to anyone having a COVID-19 positive test. Will treat with Z-pak and prednisone taper. May continue Mucinex-DM with Sudafed and increase fluid intake. Recommend stay home in isolation the next 3 days and recheck by virtual visit Monday 05-08-18. Should go to ER for evaluation and testing if fever goes up above 100.4 consistently or dyspnea worsens. - azithromycin (ZITHROMAX) 250 MG tablet; Take 2 tablets by mouth today then one daily for 4 days.  Dispense: 6 tablet; Refill: 0 - predniSONE (DELTASONE) 5 MG tablet; Taper down by 1 tablet daily starting with 6 by mouth the  first day (6,5,4,3,2,1)  Dispense: 21 tablet; Refill: 0  2. Malaise Generalized with some lightheaded/dizzy sensation intermittently. States he has been eating and drinking well. Given counseling regarding COVID-19 restrictions and need for isolation.  3. Shortness of breath Suspected due to bronchitis with wheezing versus early pneumonia. Will treat with antibiotic and prednisone for inflammation. Denies wheezing at the present. Advised he schedule another virtual visit in 3 days to assess  progress. If fever starts and goes beyond 100.4 with worsening dyspnea and cough, he should go to the ER for COVID testing. - predniSONE (DELTASONE) 5 MG tablet; Taper down by 1 tablet daily starting with 6 by mouth the first day (6,5,4,3,2,1)  Dispense: 21 tablet; Refill: 0  Follow Up Instructions:    I discussed the assessment and treatment plan with the patient. The patient was provided an opportunity to ask questions and all were answered. The patient agreed with the plan and demonstrated an understanding of the instructions.   The patient was advised to call back or seek an in-person evaluation if the symptoms worsen or if the condition fails to improve as anticipated.  I provided 14 minutes of non-face-to-face time during this encounter.       Dortha Kern, PA  [facility]

## 2018-05-09 ENCOUNTER — Encounter: Payer: Self-pay | Admitting: Family Medicine

## 2018-05-09 ENCOUNTER — Ambulatory Visit (INDEPENDENT_AMBULATORY_CARE_PROVIDER_SITE_OTHER): Payer: BLUE CROSS/BLUE SHIELD | Admitting: Family Medicine

## 2018-05-09 ENCOUNTER — Other Ambulatory Visit: Payer: Self-pay

## 2018-05-09 VITALS — Temp 97.0°F

## 2018-05-09 DIAGNOSIS — R6889 Other general symptoms and signs: Secondary | ICD-10-CM | POA: Diagnosis not present

## 2018-05-09 DIAGNOSIS — Z20822 Contact with and (suspected) exposure to covid-19: Secondary | ICD-10-CM

## 2018-05-09 NOTE — Progress Notes (Signed)
Joshua Mayo  MRN: [id number] DOB: November 09, 1975  Subjective:  HPI   Virtual Visit via Video Note  I connected with Joshua Roachhaddeus O Bermingham on 05/09/18 at  1:20 PM EDT by a video enabled telemedicine application and verified that I am speaking with the correct person using two identifiers.  Location: Patient: home Provider: Office   I discussed the limitations of evaluation and management by telemedicine and the availability of in person appointments. The patient expressed understanding and agreed to proceed.  HPI Follow up of cough, shortness of breath and malaise slowly improving. Temperature 97-98 now. Some white sputum occasionally with a little congested sensation and slight sore throat, but no wheeze. Still using Sudafed and Mucinex prn cough and congestion. Has finished the Z-pak yesterday and will finish the Prednisone taper today.    Patient Active Problem List   Diagnosis Date Noted  . Esophageal reflux 01/08/2016  . Internal hemorrhoids without complication 01/08/2016  . Snoring 01/08/2016    No past medical history on file.  Social History   Socioeconomic History  . Marital status: Married    Spouse name: Not on file  . Number of children: Not on file  . Years of education: Not on file  . Highest education level: Not on file  Occupational History  . Not on file  Social Needs  . Financial resource strain: Not on file  . Food insecurity:    Worry: Not on file    Inability: Not on file  . Transportation needs:    Medical: Not on file    Non-medical: Not on file  Tobacco Use  . Smoking status: Current Every Day Smoker    Packs/day: 1.00    Years: 20.00    Pack years: 20.00    Types: Cigarettes  . Smokeless tobacco: Never Used  Substance and Sexual Activity  . Alcohol use: Yes    Comment: weekends  . Drug use: No    Types: Cocaine    Comment: previously used cocaine. Pt states he stopped on his own with the help of his current wife   . Sexual activity:  Yes  Lifestyle  . Physical activity:    Days per week: Not on file    Minutes per session: Not on file  . Stress: Not on file  Relationships  . Social connections:    Talks on phone: Not on file    Gets together: Not on file    Attends religious service: Not on file    Active member of club or organization: Not on file    Attends meetings of clubs or organizations: Not on file    Relationship status: Not on file  . Intimate partner violence:    Fear of current or ex partner: Not on file    Emotionally abused: Not on file    Physically abused: Not on file    Forced sexual activity: Not on file  Other Topics Concern  . Not on file  Social History Narrative  . Not on file    Outpatient Encounter Medications as of 05/09/2018  Medication Sig  . azithromycin (ZITHROMAX) 250 MG tablet Take 2 tablets by mouth today then one daily for 4 days.  . cyclobenzaprine (FLEXERIL) 5 MG tablet Take 1 tablet (5 mg total) by mouth 3 (three) times daily as needed for muscle spasms.  Marland Kitchen. omeprazole (PRILOSEC) 20 MG capsule Take 20 mg by mouth daily.  . predniSONE (DELTASONE) 5 MG tablet Taper down by 1 tablet  daily starting with 6 by mouth the first day (6,5,4,3,2,1)   No facility-administered encounter medications on file as of 05/09/2018.     No Known Allergies  Review of Systems  Constitutional: Positive for malaise/fatigue. Negative for chills and fever.  HENT: Positive for congestion.   Respiratory: Positive for cough and sputum production. Negative for wheezing.        Some cough with a little white sputum production occasionally.  Cardiovascular:       Some pressure sensation in chest intermittently but less shortness of breath.    Objective:  Temp (!) 97 F (36.1 C) (Oral)   Physical Exam  WDWN male in no apparent distress.  Head: Normocephalic, atraumatic. Neck: Supple, NROM Respiratory: No apparent distress Psych: Normal mood and affect   Assessment and Plan :   1. Flu-like  symptoms Slight improvement in cough but still having chest pressure and occasional white sputum production. Still not smoking and using Sudafed with Mucinex prn. Continue to get extra fluids and follow COVID isolation.  2. Suspected Covid-19 Virus Infection Temp normal now. Continues to have some malaise, cough and chest discomfort with congestion. Finished the Z-pak yesterday and will finish the Prednisone taper today. Still isolating and washing hands frequently. Will set up COVID Companion Care monitoring program to notify us if he has any worsening. Advised to go to ER for testing if high fever (over 100.5) or shortness of breath worsening. - MyChart COVID-19 home monitoring program; Future - Temperature monitoring; Future    I discussed the assessment and treatment plan with the patient. The patient was provided an opportunity to ask questions and all were answered. The patient agreed with the plan and demonstrated an understanding of the instructions.   The patient was advised to call back or seek an in-person evaluation if the symptoms worsen or if the condition fails to improve as anticipated.  I provided 15 minutes of non-face-to-face time during this encounter.

## 2018-05-10 ENCOUNTER — Encounter (INDEPENDENT_AMBULATORY_CARE_PROVIDER_SITE_OTHER): Payer: Self-pay

## 2018-05-11 ENCOUNTER — Encounter (INDEPENDENT_AMBULATORY_CARE_PROVIDER_SITE_OTHER): Payer: Self-pay

## 2018-05-12 ENCOUNTER — Encounter: Payer: Self-pay | Admitting: Family Medicine

## 2018-05-12 ENCOUNTER — Telehealth: Payer: Self-pay | Admitting: *Deleted

## 2018-05-12 ENCOUNTER — Encounter (INDEPENDENT_AMBULATORY_CARE_PROVIDER_SITE_OTHER): Payer: Self-pay

## 2018-05-12 NOTE — Telephone Encounter (Signed)
Attempted to contact pt due to his my chart companion response of worsening shortness of breath and cough; left message on voicemail [phone].

## 2018-05-12 NOTE — Telephone Encounter (Signed)
Contacted pt regarding his symptoms; he said that his cough has worsened, and is worse at night; he says that he has taken mucinex and sudafed; he says that he coughed constantly; he also says that he feels like he can not breath because of chest pressure; the pt says that intermittently he feels like he can not get a full deep breath; the pt also says for a while he felt better, and his cough kept him up last night; recommendations made: 1: CALL PCP NOW:  * You need to discuss this with your doctor (or NP/PA).              5: HOW TO PROTECT OTHERS - WHEN YOU ARE SICK WITH COVID-19: * STAY HOME: Stay home from school or work if you are sick. Do NOT go to religious services, child care centers, shopping, or other public places. Do NOT use public transportation (e.g., bus, taxis, ride-sharing). Do NOT allow any visitors to your home. Leave the house only if you need to seek urgent medical care. * COVER THE COUGH: Cough and sneeze into your shirt sleeve or inner elbow. Don't cough into your hand or the air. If available, cough into a tissue and throw it into a trash can. * WASH HANDS OFTEN: Wash hands often with soap and water. After coughing or sneezing are important times. * WEAR A MASK: Wear a facemask when around others. Always wear a facemask (if available) if you have to leave your home (such as going to a medical facility). * CALL FIRST IF MEDICAL CARE NEEDED: Call ahead to get approval and careful directions.   6: GENERAL CARE ADVICE FOR COVID-19 SYMPTOMS: * Cough: Use cough drops. * Feeling dehydrated: Drink extra liquids. If the air in your home is dry, use a humidifier. * Fever: For fever over 101 F (38.3 C), take acetaminophen every 4-6 hours (Adults 650 mg) OR ibuprofen every 6-8 hours (Adults 400 mg). * Muscle aches, headache, and other pains: Often this comes and goes with the fever. Take acetaminophen every 4-6 hours (Adults 650 mg) OR ibuprofen every 6-8 hours (Adults 400 mg). * Sore  throat: Try throat lozenges, hard candy or warm chicken broth.   7: COUGH MEDICINES:  * OTC COUGH SYRUPS: The most common cough suppressant in OTC cough medications is dextromethorphan. Often the letters 'DM' appear in the name. * OTC COUGH DROPS: Cough drops can help a lot, especially for mild coughs. They reduce coughing by soothing your irritated throat and removing that tickle sensation in the back of the throat. Cough drops also have the advantage of portability - you can carry them with you. * HOME REMEDY - HARD CANDY: Hard candy works just as well as medicine-flavored OTC cough drops. People who have diabetes should use sugar-free candy. * HOME REMEDY - HONEY: This old home remedy has been shown to help decrease coughing at night. The adult dosage is 2 teaspoons (10 ml) at bedtime. Honey should not be given to infants under one year of age.   Marland Kitchen   9: FEVER MEDICINES:  * For fever relief, take acetaminophen or ibuprofen. * Treat fevers above 101 F (38.3 C). * The goal of fever therapy is to bring the fever down to a comfortable level. Remember that fever medicine usually lowers fever 2-3 F (1-1.5 C). ACETAMINOPHEN (E.G., TYLENOL): * Take 650 mg (two 325 mg pills) by mouth every 4-6 hours as needed. Each Regular Strength Tylenol pill has 325 mg of acetaminophen.  The most you should take each day is 3,250 mg (10 Regular Strength pills a day). * Another choice is to take 1,000 mg (two 500 mg pills) every 8 hours as needed. Each Extra Strength Tylenol pill has 500 mg of acetaminophen. The most you should take each day is 3,000 mg (6 Extra Strength pills a day). IBUPROFEN (E.G., MOTRIN, ADVIL): * Take 400 mg (two 200 mg pills) by mouth every 6 hours as needed. * The most you should take each day is 1,200 mg (six 200 mg pills a day), unless your doctor has told you to take more. EXTRA NOTES: * Acetaminophen is thought to be safer than ibuprofen or naproxen for people over 45 years old. Acetaminophen  is in many OTC and prescription medicines. It might be in more than one medicine that you are taking. You need to be careful and not take an overdose. An acetaminophen overdose can hurt the liver. [employer], the company that makes Tylenol, has different dosage instructions for Tylenol in [place] and [place]. In [place], the maximum recommended dose per day is 4,000 mg or twelve (12) Regular-Strength (325 mg) pills. In the [place], [employer] recommends a maximum dose of ten (10) Regular-Strength (325 mg) pills. * Before taking any medicine, read all the instructions on the package.   10: CAUTION - NSAIDS (E.G., IBUPROFEN, NAPROXEN):  * Do not take nonsteroidal anti-inflammatory drugs (NSAIDs) if you have stomach problems, kidney disease, heart failure, or other contraindications to using this type of medicine.  * Do not take NSAID medicines for over 7 days without consulting your PCP.  * Do not take NSAID medicines if you are pregnant. * Do not take NSAID medicines if you are also taking blood thinners.  * You may take this medicine with or without food. Taking it with food or milk may lessen the chance the drug will upset your stomach.  * GASTROINTESTINAL RISK: There is an increased risk of stomach ulcers, GI bleeding, perforation.  * CARDIOVASCULAR RISK: There may be an increased risk of heart attack and stroke.   11: CALL BACK IF:  * You become worse.   12: CARE ADVICE given per CORONAVIRUS (COVID-19) - DIAGNOSED OR SUSPECTED (Adult) guideline.   No      When pt advised not to go to work, he states "I have to go"; further advised pt to please stay at home due to the possible spread to others; pt also instructed to contact his PCP for these issues; he verbalizes understanding; the pt [facility] sees [facility]ern[facility]; will route to provider for notification of this encounter.

## 2018-05-14 ENCOUNTER — Telehealth: Payer: Self-pay

## 2018-05-14 NOTE — Telephone Encounter (Signed)
Sent patient a Clinical cytogeneticist message in regards to his mychart companion. Called patient and left voicemail. Called the on call provider as well, mychart message below.    If your shortness of breath is stable then continue to monitor at home and if symptoms become severe: ex SOB at rest, gasping for air or wheezing call 911 and seek medical treatment.  If cough remains the same or better: continue to treat with over the counter medications. Hard candy or cough drops and drinking warm fluids. Adults can also use honey 2 tsp (10 ML) at bedtime.   HONEY IS NOT RECOMMENDED FOR INFANTS UNDER ONE.  If cough is becoming worse even with the use of over the counter medications and patient is not able to sleep at night, cough becomes productive with sputum that maybe yellow or green in color, contact PCP.  I did call and leave a message please feel free to reach out to myself or to contact the primary care provider in regards to this. I will also notify the on call provider for the mychart companion.

## 2018-05-14 NOTE — Telephone Encounter (Signed)
Called patient a second time and he stated that he agrees to go to emergency department but mentions wanted to wait another hour. He has a person that will drive him. He states he feels like he has "popcorn lungs" and it is just really hard for him to breathe. He is also experiencing chest pain. Agreed to go to the emergency department now after further discussion. This is  agreeable with what the on call provider, Hal Morales, suggested.

## 2018-05-15 ENCOUNTER — Encounter: Payer: Self-pay | Admitting: Emergency Medicine

## 2018-05-15 ENCOUNTER — Other Ambulatory Visit: Payer: Self-pay

## 2018-05-15 ENCOUNTER — Emergency Department: Payer: BLUE CROSS/BLUE SHIELD

## 2018-05-15 ENCOUNTER — Encounter: Payer: Self-pay | Admitting: Family Medicine

## 2018-05-15 ENCOUNTER — Encounter (INDEPENDENT_AMBULATORY_CARE_PROVIDER_SITE_OTHER): Payer: Self-pay

## 2018-05-15 ENCOUNTER — Inpatient Hospital Stay
Admission: EM | Admit: 2018-05-15 | Discharge: 2018-05-19 | DRG: 291 | Disposition: A | Discharge status: Home / Self Care | Payer: BLUE CROSS/BLUE SHIELD | Attending: Internal Medicine | Admitting: Internal Medicine

## 2018-05-15 DIAGNOSIS — I248 Other forms of acute ischemic heart disease: Secondary | ICD-10-CM | POA: Diagnosis present

## 2018-05-15 DIAGNOSIS — K709 Alcoholic liver disease, unspecified: Secondary | ICD-10-CM | POA: Diagnosis not present

## 2018-05-15 DIAGNOSIS — F1290 Cannabis use, unspecified, uncomplicated: Secondary | ICD-10-CM | POA: Diagnosis present

## 2018-05-15 DIAGNOSIS — R6520 Severe sepsis without septic shock: Secondary | ICD-10-CM | POA: Diagnosis not present

## 2018-05-15 DIAGNOSIS — Z79899 Other long term (current) drug therapy: Secondary | ICD-10-CM | POA: Diagnosis not present

## 2018-05-15 DIAGNOSIS — Z9114 Patient's other noncompliance with medication regimen: Secondary | ICD-10-CM

## 2018-05-15 DIAGNOSIS — I340 Nonrheumatic mitral (valve) insufficiency: Secondary | ICD-10-CM | POA: Diagnosis not present

## 2018-05-15 DIAGNOSIS — F1010 Alcohol abuse, uncomplicated: Secondary | ICD-10-CM | POA: Diagnosis not present

## 2018-05-15 DIAGNOSIS — I429 Cardiomyopathy, unspecified: Secondary | ICD-10-CM | POA: Diagnosis not present

## 2018-05-15 DIAGNOSIS — I420 Dilated cardiomyopathy: Secondary | ICD-10-CM | POA: Diagnosis present

## 2018-05-15 DIAGNOSIS — Z791 Long term (current) use of non-steroidal anti-inflammatories (NSAID): Secondary | ICD-10-CM | POA: Diagnosis not present

## 2018-05-15 DIAGNOSIS — I472 Ventricular tachycardia: Secondary | ICD-10-CM | POA: Diagnosis not present

## 2018-05-15 DIAGNOSIS — Z9119 Patient's noncompliance with other medical treatment and regimen: Secondary | ICD-10-CM | POA: Diagnosis not present

## 2018-05-15 DIAGNOSIS — I110 Hypertensive heart disease with heart failure: Principal | ICD-10-CM | POA: Diagnosis present

## 2018-05-15 DIAGNOSIS — R748 Abnormal levels of other serum enzymes: Secondary | ICD-10-CM

## 2018-05-15 DIAGNOSIS — F17210 Nicotine dependence, cigarettes, uncomplicated: Secondary | ICD-10-CM | POA: Diagnosis present

## 2018-05-15 DIAGNOSIS — R197 Diarrhea, unspecified: Secondary | ICD-10-CM | POA: Diagnosis present

## 2018-05-15 DIAGNOSIS — J9600 Acute respiratory failure, unspecified whether with hypoxia or hypercapnia: Secondary | ICD-10-CM

## 2018-05-15 DIAGNOSIS — Z8249 Family history of ischemic heart disease and other diseases of the circulatory system: Secondary | ICD-10-CM | POA: Diagnosis not present

## 2018-05-15 DIAGNOSIS — Z716 Tobacco abuse counseling: Secondary | ICD-10-CM | POA: Diagnosis not present

## 2018-05-15 DIAGNOSIS — J810 Acute pulmonary edema: Secondary | ICD-10-CM | POA: Diagnosis not present

## 2018-05-15 DIAGNOSIS — I21A1 Myocardial infarction type 2: Secondary | ICD-10-CM | POA: Diagnosis not present

## 2018-05-15 DIAGNOSIS — R749 Abnormal serum enzyme level, unspecified: Secondary | ICD-10-CM | POA: Diagnosis not present

## 2018-05-15 DIAGNOSIS — J129 Viral pneumonia, unspecified: Secondary | ICD-10-CM | POA: Diagnosis present

## 2018-05-15 DIAGNOSIS — I509 Heart failure, unspecified: Secondary | ICD-10-CM

## 2018-05-15 DIAGNOSIS — A419 Sepsis, unspecified organism: Secondary | ICD-10-CM

## 2018-05-15 DIAGNOSIS — K219 Gastro-esophageal reflux disease without esophagitis: Secondary | ICD-10-CM | POA: Diagnosis present

## 2018-05-15 DIAGNOSIS — I5021 Acute systolic (congestive) heart failure: Secondary | ICD-10-CM | POA: Diagnosis not present

## 2018-05-15 DIAGNOSIS — E876 Hypokalemia: Secondary | ICD-10-CM | POA: Diagnosis present

## 2018-05-15 DIAGNOSIS — R079 Chest pain, unspecified: Secondary | ICD-10-CM | POA: Diagnosis not present

## 2018-05-15 DIAGNOSIS — Z91018 Allergy to other foods: Secondary | ICD-10-CM | POA: Diagnosis not present

## 2018-05-15 DIAGNOSIS — J9601 Acute respiratory failure with hypoxia: Secondary | ICD-10-CM | POA: Diagnosis present

## 2018-05-15 DIAGNOSIS — Z20828 Contact with and (suspected) exposure to other viral communicable diseases: Secondary | ICD-10-CM | POA: Diagnosis not present

## 2018-05-15 DIAGNOSIS — R918 Other nonspecific abnormal finding of lung field: Secondary | ICD-10-CM | POA: Diagnosis not present

## 2018-05-15 DIAGNOSIS — R7989 Other specified abnormal findings of blood chemistry: Secondary | ICD-10-CM | POA: Diagnosis not present

## 2018-05-15 DIAGNOSIS — F17200 Nicotine dependence, unspecified, uncomplicated: Secondary | ICD-10-CM | POA: Diagnosis not present

## 2018-05-15 DIAGNOSIS — K760 Fatty (change of) liver, not elsewhere classified: Secondary | ICD-10-CM | POA: Diagnosis not present

## 2018-05-15 DIAGNOSIS — I10 Essential (primary) hypertension: Secondary | ICD-10-CM | POA: Diagnosis not present

## 2018-05-15 HISTORY — DX: Essential (primary) hypertension: I10

## 2018-05-15 LAB — CBC WITH DIFFERENTIAL/PLATELET
Abs Immature Granulocytes: 0.01 10*3/uL (ref 0.00–0.07)
Basophils Absolute: 0.1 10*3/uL (ref 0.0–0.1)
Basophils Relative: 1 %
Eosinophils Absolute: 0 10*3/uL (ref 0.0–0.5)
Eosinophils Relative: 0 %
HCT: 41.4 % (ref 39.0–52.0)
Hemoglobin: 13.6 g/dL (ref 13.0–17.0)
Immature Granulocytes: 0 %
Lymphocytes Relative: 31 %
Lymphs Abs: 2.4 10*3/uL (ref 0.7–4.0)
MCH: 29.5 pg (ref 26.0–34.0)
MCHC: 32.9 g/dL (ref 30.0–36.0)
MCV: 89.8 fL (ref 80.0–100.0)
Monocytes Absolute: 0.4 10*3/uL (ref 0.1–1.0)
Monocytes Relative: 6 %
Neutro Abs: 4.7 10*3/uL (ref 1.7–7.7)
Neutrophils Relative %: 62 %
Platelets: 229 10*3/uL (ref 150–400)
RBC: 4.61 MIL/uL (ref 4.22–5.81)
RDW: 12.8 % (ref 11.5–15.5)
WBC: 7.6 10*3/uL (ref 4.0–10.5)
nRBC: 0 % (ref 0.0–0.2)

## 2018-05-15 LAB — SARS CORONAVIRUS 2 BY RT PCR (HOSPITAL ORDER, PERFORMED IN ~~LOC~~ HOSPITAL LAB): SARS Coronavirus 2: NEGATIVE

## 2018-05-15 LAB — LACTIC ACID, PLASMA
Lactic Acid, Venous: 2.5 mmol/L (ref 0.5–1.9)
Lactic Acid, Venous: 4.5 mmol/L (ref 0.5–1.9)

## 2018-05-15 LAB — COMPREHENSIVE METABOLIC PANEL
ALT: 142 U/L — ABNORMAL HIGH (ref 0–44)
AST: 75 U/L — ABNORMAL HIGH (ref 15–41)
Albumin: 4.5 g/dL (ref 3.5–5.0)
Alkaline Phosphatase: 71 U/L (ref 38–126)
Anion gap: 14 (ref 5–15)
BUN: 16 mg/dL (ref 6–20)
CO2: 22 mmol/L (ref 22–32)
Calcium: 9.1 mg/dL (ref 8.9–10.3)
Chloride: 107 mmol/L (ref 98–111)
Creatinine, Ser: 0.85 mg/dL (ref 0.61–1.24)
GFR calc Af Amer: >60 mL/min (ref >60)
GFR calc non Af Amer: >60 mL/min (ref >60)
Glucose, Bld: 104 mg/dL — ABNORMAL HIGH (ref 70–99)
Potassium: 4.1 mmol/L (ref 3.5–5.1)
Sodium: 143 mmol/L (ref 135–145)
Total Bilirubin: 1.4 mg/dL — ABNORMAL HIGH (ref 0.3–1.2)
Total Protein: 8.2 g/dL — ABNORMAL HIGH (ref 6.5–8.1)

## 2018-05-15 LAB — INFLUENZA PANEL BY PCR (TYPE A & B)
Influenza A By PCR: NEGATIVE
Influenza B By PCR: NEGATIVE

## 2018-05-15 LAB — GLUCOSE, CAPILLARY: Glucose-Capillary: 152 mg/dL — ABNORMAL HIGH (ref 70–99)

## 2018-05-15 LAB — MRSA PCR SCREENING: MRSA by PCR: NEGATIVE

## 2018-05-15 LAB — FIBRIN DERIVATIVES D-DIMER (ARMC ONLY): Fibrin derivatives D-dimer (ARMC): 995.76 ng/mL (FEU) — ABNORMAL HIGH (ref 0.00–499.00)

## 2018-05-15 LAB — FERRITIN: Ferritin: 483 ng/mL — ABNORMAL HIGH (ref 24–336)

## 2018-05-15 LAB — BRAIN NATRIURETIC PEPTIDE: B Natriuretic Peptide: 519 pg/mL — ABNORMAL HIGH (ref 0.0–100.0)

## 2018-05-15 LAB — TROPONIN I
Troponin I: <0.03 ng/mL (ref <0.03)
Troponin I: <0.03 ng/mL (ref <0.03)

## 2018-05-15 LAB — C-REACTIVE PROTEIN: CRP: 1.4 mg/dL — ABNORMAL HIGH (ref <1.0)

## 2018-05-15 MED ORDER — DOCUSATE SODIUM 100 MG PO CAPS: 100.0000 mg | ORAL_CAPSULE | Freq: Two times a day (BID) | ORAL | Status: DC | PRN

## 2018-05-15 MED ORDER — ONDANSETRON HCL 4 MG/2ML IJ SOLN
4.0000 mg | Freq: Once | INTRAMUSCULAR | Status: AC
  Administered 2018-05-15: 19:00:00 4 mg via INTRAVENOUS

## 2018-05-15 MED ORDER — SODIUM CHLORIDE 0.9 % IV BOLUS
500.0000 mL | Freq: Once | INTRAVENOUS | Status: AC
  Administered 2018-05-15: 500 mL via INTRAVENOUS

## 2018-05-15 MED ORDER — METOPROLOL TARTRATE 5 MG/5ML IV SOLN
INTRAVENOUS | Status: AC
  Administered 2018-05-15: 5 mg via INTRAVENOUS
  Filled 2018-05-15: qty 5

## 2018-05-15 MED ORDER — LABETALOL HCL 5 MG/ML IV SOLN
5.0000 mg | Freq: Once | INTRAVENOUS | Status: AC
  Administered 2018-05-15: 5 mg via INTRAVENOUS
  Filled 2018-05-15 (×2): qty 4

## 2018-05-15 MED ORDER — FUROSEMIDE 10 MG/ML IJ SOLN
20.0000 mg | Freq: Three times a day (TID) | INTRAMUSCULAR | Status: DC
  Administered 2018-05-15 – 2018-05-16 (×2): 20 mg via INTRAVENOUS
  Filled 2018-05-15 (×2): qty 2

## 2018-05-15 MED ORDER — ONDANSETRON HCL 4 MG/2ML IJ SOLN
INTRAMUSCULAR | Status: AC
  Administered 2018-05-15: 19:00:00 4 mg via INTRAVENOUS
  Filled 2018-05-15: qty 2

## 2018-05-15 MED ORDER — ONDANSETRON HCL 4 MG/2ML IJ SOLN
4.0000 mg | Freq: Once | INTRAMUSCULAR | Status: AC
  Administered 2018-05-15: 4 mg via INTRAVENOUS

## 2018-05-15 MED ORDER — HYDRALAZINE HCL 20 MG/ML IJ SOLN
10.0000 mg | INTRAMUSCULAR | Status: DC | PRN
  Administered 2018-05-15: 20 mg via INTRAVENOUS
  Administered 2018-05-16: 10 mg via INTRAVENOUS
  Filled 2018-05-15 (×2): qty 1

## 2018-05-15 MED ORDER — METOPROLOL TARTRATE 5 MG/5ML IV SOLN: 5.0000 mg | Freq: Four times a day (QID) | INTRAVENOUS | Status: DC | PRN

## 2018-05-15 MED ORDER — FUROSEMIDE 10 MG/ML IJ SOLN
INTRAMUSCULAR | Status: AC
  Administered 2018-05-15: 18:00:00 20 mg via INTRAVENOUS
  Filled 2018-05-15: qty 4

## 2018-05-15 MED ORDER — ACETAMINOPHEN 500 MG PO TABS
1000.0000 mg | ORAL_TABLET | Freq: Once | ORAL | Status: AC
  Administered 2018-05-15: 17:00:00 1000 mg via ORAL
  Filled 2018-05-15: qty 2

## 2018-05-15 MED ORDER — COLCHICINE 0.6 MG PO TABS
0.6000 mg | ORAL_TABLET | Freq: Two times a day (BID) | ORAL | Status: DC
  Administered 2018-05-15 – 2018-05-17 (×4): 0.6 mg via ORAL
  Filled 2018-05-15 (×5): qty 1

## 2018-05-15 MED ORDER — HEPARIN SODIUM (PORCINE) 5000 UNIT/ML IJ SOLN
5000.0000 [IU] | Freq: Three times a day (TID) | INTRAMUSCULAR | Status: DC
  Administered 2018-05-15: 5000 [IU] via SUBCUTANEOUS
  Filled 2018-05-15: qty 1

## 2018-05-15 MED ORDER — SODIUM CHLORIDE 0.9 % IV SOLN
500.0000 mg | INTRAVENOUS | Status: DC
  Administered 2018-05-15 – 2018-05-16 (×2): 500 mg via INTRAVENOUS
  Filled 2018-05-15 (×3): qty 500

## 2018-05-15 MED ORDER — ONDANSETRON HCL 4 MG/2ML IJ SOLN
INTRAMUSCULAR | Status: AC
  Administered 2018-05-15: 4 mg via INTRAVENOUS
  Filled 2018-05-15: qty 2

## 2018-05-15 MED ORDER — FUROSEMIDE 10 MG/ML IJ SOLN
20.0000 mg | Freq: Once | INTRAMUSCULAR | Status: AC
  Administered 2018-05-15: 18:00:00 20 mg via INTRAVENOUS
  Filled 2018-05-15: qty 2

## 2018-05-15 MED ORDER — SODIUM CHLORIDE 0.9 % IV SOLN
2.0000 g | INTRAVENOUS | Status: DC
  Administered 2018-05-15 – 2018-05-16 (×2): 2 g via INTRAVENOUS
  Filled 2018-05-15 (×3): qty 20
  Filled 2018-05-15: qty 2

## 2018-05-15 MED ORDER — HEPARIN (PORCINE) 25000 UT/250ML-% IV SOLN
1300.0000 [IU]/h | INTRAVENOUS | Status: DC
  Administered 2018-05-15: 1000 [IU]/h via INTRAVENOUS
  Administered 2018-05-16: 1150 [IU]/h via INTRAVENOUS
  Administered 2018-05-17: 1300 [IU]/h via INTRAVENOUS
  Filled 2018-05-15 (×3): qty 250

## 2018-05-15 MED ORDER — METOPROLOL TARTRATE 5 MG/5ML IV SOLN
5.0000 mg | Freq: Once | INTRAVENOUS | Status: AC
  Administered 2018-05-15: 19:00:00 5 mg via INTRAVENOUS

## 2018-05-15 NOTE — ED Notes (Signed)
RT at bedside to set up Bipap at this time.

## 2018-05-15 NOTE — Progress Notes (Signed)
Family Meeting Note  Advance Directive:yes  Today a meeting took place with the patient and spouse.   The following clinical team members were present during this meeting:MD  The following were discussed:Patient's diagnosis: Acute respiratory failure, congestive heart failure, hypertension, smoking, strong history of cardiomyopathy and family, Patient's progosis: Unable to determine and Goals for treatment: Full Code  Additional follow-up to be provided: Cardiologist  Time spent during discussion:20 minutes  Altamese Dilling, MD

## 2018-05-15 NOTE — Consult Note (Signed)
Name: Joshua Mayo MRN: [id number] DOB: 1975-08-07    ADMISSION DATE:  05/15/2018 CONSULTATION DATE: 05/15/2018  REFERRING MD : Dr. Elisabeth PigeonVachhani   CHIEF COMPLAINT: Shortness of Breath   BRIEF PATIENT DESCRIPTION:  43 yo male admitted with acute hypoxic respiratory failure possibly secondary to pneumonia requiring Bipap. COVID-19 negative although lung ultrasound findings and s/sx highly suspicious for false negative   SIGNIFICANT EVENTS/STUDIES:  05/11-Pt admitted to the stepdown unit on Bipap   HISTORY OF PRESENT ILLNESS:   This is a 43 yo male with a PMH of HTN and Current Everyday Smoker (smokes 1PPD).  [facility] on 05/11 with worsening shortness of breath, headaches, chills, weakness, bodyaches, and fatigue onset of symptoms 3 weeks prior to presentation.  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  He states the dealership placed a plexiglass shield at his desk 2 weeks ago, however prior to this on average he had contact with 5 customers daily without wearing a mask.  He denies known contact with anyone COVID-19 positive, although his son does work at [employer] and a Radio broadcast assistantcoworker tested positive for COVID-19.  He lives with his wife and son, they have not shown any signs of being sick. His daughter works as a LawyerCNA at a nursing facility, however he has not had contact with her in several weeks.  Upon arrival to the ER pt febrile temp 99.1 F, tachycardia, and tachypneic pt placed on Bipap.  Lab results revealed BNP 519, troponin <0.03, ferritin 483, lactic acid 2.5, and d-dimer 995.76.  Influenza PCR negative and CXR concerning for cardiomegaly, multifocal pneumonia (viral or bacterial) although pulmonary edema could not be excluded.  COVID-19 negative, although US of lungs and pt s/sx concerning for possible false negative.  Pt also hypertensive bp 178/115.  Pt received acetaminophen, iv metoprolol, zofran x2 doses, 500 ml NS bolus, iv  furosemide, azithromycin, and ceftriaxone.  He was subsequently admitted to the stepdown unit by hospitalist team for additional workup and treatment   PAST MEDICAL HISTORY :   has a past medical history of Hypertension.  has no past surgical history on file. Prior to Admission medications   Medication Sig Start Date End Date Taking? Authorizing Provider  acetaminophen (TYLENOL) 500 MG tablet Take 500-1,000 mg by mouth every 6 (six) hours as needed for mild pain or fever.   Yes [provider]  guaiFENesin (MUCINEX) 600 MG 12 hr tablet Take 600 mg by mouth 2 (two) times daily as needed for cough or to loosen phlegm.   Yes [provider]  Omeprazole 20 MG TBEC Take 40 mg by mouth 2 (two) times daily.    Yes [provider]  pseudoephedrine (SUDAFED) 30 MG tablet Take 30-60 mg by mouth every 6 (six) hours as needed for congestion.   Yes [provider]  azithromycin (ZITHROMAX) 250 MG tablet Take 2 tablets by mouth today then one daily for 4 days. Patient not taking: Reported on 05/15/2018 05/05/18   Chrismon, Jodell Ciproennis E, PA  cyclobenzaprine (FLEXERIL) 5 MG tablet Take 1 tablet (5 mg total) by mouth 3 (three) times daily as needed for muscle spasms. Patient not taking: Reported on 05/15/2018 10/29/17   Nita SickleVeronese, Hendricks, MD  predniSONE (DELTASONE) 5 MG tablet Taper down by 1 tablet daily starting with 6 by mouth the first day (6,5,4,3,2,1) Patient not taking: Reported on 05/15/2018 05/05/18   Chrismon, Jodell Ciproennis E, PA   No  Known Allergies  FAMILY HISTORY:  family history includes Heart disease in his father and paternal grandfather. SOCIAL HISTORY:  reports that he has been smoking cigarettes. He has a 20.00 pack-year smoking history. He has never used smokeless tobacco. He reports current alcohol use. He reports that he does not use drugs.  REVIEW OF SYSTEMS: Positives in BOLD  Constitutional: fever, chills, weight loss, malaise/fatigue and diaphoresis.  HENT:  Negative for hearing loss, ear pain, nosebleeds, congestion, sore throat, neck pain, tinnitus and ear discharge.   Eyes: Negative for blurred vision, double vision, photophobia, pain, discharge and redness.  Respiratory: cough, hemoptysis, sputum production, shortness of breath, wheezing and stridor.   Cardiovascular: Negative for chest pain, palpitations, orthopnea, claudication, leg swelling and PND.  Gastrointestinal: heartburn, nausea, vomiting, abdominal pain, diarrhea, constipation, blood in stool and melena.  Genitourinary: Negative for dysuria, urgency, frequency, hematuria and flank pain.  Musculoskeletal: generalized bodyaches,myalgias, back pain, joint pain and falls.  Skin: Negative for itching and rash.  Neurological: Negative for dizziness, tingling, tremors, sensory change, speech change, focal weakness, seizures, loss of consciousness, weakness and headaches.  Endo/Heme/Allergies: Negative for environmental allergies and polydipsia. Does not bruise/bleed easily.  SUBJECTIVE:  Pt states breathing has improved since he was placed on Bipap   VITAL SIGNS: Temp:  [99.1 F (37.3 C)-100.3 F (37.9 C)] 100.3 F (37.9 C) (05/11 1624) Pulse Rate:  [107-143] 107 (05/11 1850) Resp:  [24-31] 25 (05/11 1850) BP: (126-180)/(91-138) 126/102 (05/11 1850) SpO2:  [91 %-98 %] 91 % (05/11 1850) FiO2 (%):  [50 %] 50 % (05/11 1850) Weight:  [81.6 kg] 81.6 kg (05/11 1554)  PHYSICAL EXAMINATION: General: acutely ill appearing male, NAD on Bipap  Neuro: alert and oriented, follows commands  HEENT: supple, no JVD Cardiovascular: sinus tach, no R/G  Lungs: faint rhonchi throughout, even, non labored  Abdomen: +BS x4, obese, soft, non distended, tender  Musculoskeletal: normal bulk and tone, no edema  Skin: intact no rashes or lesions present   Recent Labs  Lab 05/15/18 1648  NA 143  K 4.1  CL 107  CO2 22  BUN 16  CREATININE 0.85  GLUCOSE 104*   Recent Labs  Lab 05/15/18 1648   HGB 13.6  HCT 41.4  WBC 7.6  PLT 229   Dg Chest Portable 1 View  Result Date: 05/15/2018 CLINICAL DATA:  Shortness of breath.  Chest pain. EXAM: PORTABLE CHEST 1 VIEW COMPARISON:  Patient's prior chest x-ray from 2001 could not be retrieved for comparison. FINDINGS: The cardiac silhouette is enlarged. There is no large pleural effusion. There are diffuse bilateral airspace opacities. No pneumothorax. No large pleural effusion. There is no acute osseous abnormality. IMPRESSION: 1. Diffuse bilateral hazy airspace opacities concerning for multifocal pneumonia (viral or bacterial). Pulmonary edema can have a similar appearance. 2. Cardiomegaly. Electronically Signed   By: Katherine Mantle M.D.   On: 05/15/2018 17:21    ASSESSMENT / PLAN:  Acute hypoxic respiratory failure likely secondary to pneumonia and pulmonary edema COVID-19 negative, although lung US findings and s/sx concerning for possible false negative Hx: Current everyday smoker  Supplemental O2 for hypoxia and dyspnea  Prn bronchodilator therapy for dyspnea Maintain airborne and contact precautions  Aggressive pulmonary hygiene  Self-proning per COVID-19 protocol  Continue schedule iv lasix  Trend WBC and monitor fever curve  Trend PCT and lactic acid  Trend ferritin  Follow cultures  Will check strep pneumoniae urinary antigen and legionella  Continue azithromycin and ceftriaxone Will start colchicine 0.6  mg bid for 2 days then daily  ID consulted appreciate input-will likely need antibody testing Smoking cessation counseling provided   Hypertension  Continuous telemetry monitoring  Echo pending  Will start heparin gtt due to elevated d-dimer and risk of blood clots  Prn hydralazine and metoprolol for bp management  Trend CBC, d-dimer, and troponin    Transaminitis  Trend hepatic panel   Sonda Rumble, AGNP  Pulmonary/Critical Care Pager [phone] (please enter 7 digits) PCCM Consult Pager [phone]  (please enter 7 digits)

## 2018-05-15 NOTE — Progress Notes (Signed)
eLink Physician-Brief Progress Note Patient Name: [name] DOB: 03-05-1975 MRN: [id number]   Date of Service  05/15/2018  HPI/Events of Note  43 y.o. male with a known history of hypertension but not compliant with doctor's visits and does not take any medications.  He is a chronic smoker almost 1 pack cigarettes every day.  He was having shortness of breath with some cough for last 2 to 3 weeks which is progressively getting worse.  He denies any associated fever or chills with that.  COVID-19 rapid test negative. Now on BiPAP. PCCM asked to assume care. VSS.  eICU Interventions  No new orders.      Intervention Category Evaluation Type: New Patient Evaluation  [name] 05/15/2018, 8:19 PM

## 2018-05-15 NOTE — ED Notes (Signed)
Dr. Roxan Hockey given wife's phone number to call and update per pt's wishes.

## 2018-05-15 NOTE — ED Provider Notes (Addendum)
[facility] Emergency Department Provider Note    First MD Initiated Contact with Patient 05/15/18 1619     (approximate)  I have reviewed the triage vital signs and the nursing notes.   HISTORY  Chief Complaint Shortness of Breath; Generalized Body Aches; and Diarrhea    HPI Joshua Mayo is a 43 y.o. male presents the ER for evaluation of flulike illness for the past several days.  Has been having shaking chills muscle aches headaches nausea vomiting shortness of breath and chest discomfort.  Feels like there is an elephant sitting on his chest and has significant exertional dyspnea.  Denies any history of asthma bronchitis or COPD.  No history of CHF.  He does smoke.  No recent travel.  Was recently treated with steroid and azithromycin without any improvement in symptoms.    History reviewed. No pertinent past medical history. Family History  Problem Relation Age of Onset   Heart disease Father    Heart disease Paternal Grandfather    History reviewed. No pertinent surgical history. Patient Active Problem List   Diagnosis Date Noted   Acute respiratory failure with hypoxia (HCC) 05/15/2018   Esophageal reflux 01/08/2016   Internal hemorrhoids without complication 01/08/2016   Snoring 01/08/2016      Prior to Admission medications   Medication Sig Start Date End Date Taking? Authorizing Provider  acetaminophen (TYLENOL) 500 MG tablet Take 500-1,000 mg by mouth every 6 (six) hours as needed for mild pain or fever.   Yes [provider]  guaiFENesin (MUCINEX) 600 MG 12 hr tablet Take 600 mg by mouth 2 (two) times daily as needed for cough or to loosen phlegm.   Yes [provider]  Omeprazole 20 MG TBEC Take 40 mg by mouth 2 (two) times daily.    Yes [provider]  pseudoephedrine (SUDAFED) 30 MG tablet Take 30-60 mg by mouth every 6 (six) hours as needed for congestion.   Yes [provider]    azithromycin (ZITHROMAX) 250 MG tablet Take 2 tablets by mouth today then one daily for 4 days. Patient not taking: Reported on 05/15/2018 05/05/18   Chrismon, Jodell Ciproennis E, PA  cyclobenzaprine (FLEXERIL) 5 MG tablet Take 1 tablet (5 mg total) by mouth 3 (three) times daily as needed for muscle spasms. Patient not taking: Reported on 05/15/2018 10/29/17   Nita SickleVeronese, [place], MD  predniSONE (DELTASONE) 5 MG tablet Taper down by 1 tablet daily starting with 6 by mouth the first day (6,5,4,3,2,1) Patient not taking: Reported on 05/15/2018 05/05/18   Chrismon, Jodell Ciproennis E, PA    Allergies Patient has no known allergies.    Social History Social History   Tobacco Use   Smoking status: Current Every Day Smoker    Packs/day: 1.00    Years: 20.00    Pack years: 20.00    Types: Cigarettes   Smokeless tobacco: Never Used  Substance Use Topics   Alcohol use: Yes    Comment: weekends   Drug use: No    Types: Cocaine    Comment: previously used cocaine. Pt states he stopped on his own with the help of his current wife     Review of Systems Patient denies headaches, rhinorrhea, blurry vision, numbness, shortness of breath, chest pain, edema, cough, abdominal pain, nausea, vomiting, diarrhea, dysuria, fevers, rashes or hallucinations unless otherwise stated above in HPI. ____________________________________________   PHYSICAL EXAM:  VITAL SIGNS: Vitals:   05/15/18 1833 05/15/18 1850  BP: (!) 158/91 Marland Kitchen(!)  126/102  Pulse: (!) 130 (!) 107  Resp: (!) 31 (!) 25  Temp:    SpO2: 91% 91%    Constitutional: Alert and oriented. Ill appearing, diaphoretic, tachypneic Eyes: Conjunctivae are normal.  Head: Atraumatic. Nose: No congestion/rhinnorhea. Mouth/Throat: Mucous membranes are moist.   Neck: No stridor. Painless ROM.  Cardiovascular: tachycardic rate, regular rhythm. Grossly normal heart sounds.  Good peripheral circulation. Respiratory: tachypnea with coarse bibasilar  breathsounds Gastrointestinal: Soft and nontender. No distention. No abdominal bruits. No CVA tenderness. Genitourinary: deferred Musculoskeletal: No lower extremity tenderness nor edema.  No joint effusions. Neurologic:  Normal speech and language. No gross focal neurologic deficits are appreciated. No facial droop Skin:  Skin is warm, diaphoretic and intact. No rash noted. Psychiatric: Mood and affect are normal. Speech and behavior are normal.  ____________________________________________   LABS (all labs ordered are listed, but only abnormal results are displayed)  Results for orders placed or performed during the hospital encounter of 05/15/18 (from the past 24 hour(s))  CBC with Differential/Platelet     Status: None   Collection Time: 05/15/18  4:48 PM  Result Value Ref Range   WBC 7.6 4.0 - 10.5 K/uL   RBC 4.61 4.22 - 5.81 MIL/uL   Hemoglobin 13.6 13.0 - 17.0 g/dL   HCT 40.9 81.1 - 91.4 %   MCV 89.8 80.0 - 100.0 fL   MCH 29.5 26.0 - 34.0 pg   MCHC 32.9 30.0 - 36.0 g/dL   RDW 78.2 95.6 - 21.3 %   Platelets 229 150 - 400 K/uL   nRBC 0.0 0.0 - 0.2 %   Neutrophils Relative % 62 %   Neutro Abs 4.7 1.7 - 7.7 K/uL   Lymphocytes Relative 31 %   Lymphs Abs 2.4 0.7 - 4.0 K/uL   Monocytes Relative 6 %   Monocytes Absolute 0.4 0.1 - 1.0 K/uL   Eosinophils Relative 0 %   Eosinophils Absolute 0.0 0.0 - 0.5 K/uL   Basophils Relative 1 %   Basophils Absolute 0.1 0.0 - 0.1 K/uL   Immature Granulocytes 0 %   Abs Immature Granulocytes 0.01 0.00 - 0.07 K/uL  Comprehensive metabolic panel     Status: Abnormal   Collection Time: 05/15/18  4:48 PM  Result Value Ref Range   Sodium 143 135 - 145 mmol/L   Potassium 4.1 3.5 - 5.1 mmol/L   Chloride 107 98 - 111 mmol/L   CO2 22 22 - 32 mmol/L   Glucose, Bld 104 (H) 70 - 99 mg/dL   BUN 16 6 - 20 mg/dL   Creatinine, Ser 0.86 0.61 - 1.24 mg/dL   Calcium 9.1 8.9 - 57.8 mg/dL   Total Protein 8.2 (H) 6.5 - 8.1 g/dL   Albumin 4.5 3.5 - 5.0  g/dL   AST 75 (H) 15 - 41 U/L   ALT 142 (H) 0 - 44 U/L   Alkaline Phosphatase 71 38 - 126 U/L   Total Bilirubin 1.4 (H) 0.3 - 1.2 mg/dL   GFR calc non Af Amer >60 >60 mL/min   GFR calc Af Amer >60 >60 mL/min   Anion gap 14 5 - 15  Troponin I - ONCE - STAT     Status: None   Collection Time: 05/15/18  4:48 PM  Result Value Ref Range   Troponin I <0.03 <0.03 ng/mL  SARS Coronavirus 2 (CEPHEID- Performed in [facility] hospital lab), Hosp Order     Status: None   Collection Time: 05/15/18  4:48 PM  Result Value Ref Range   SARS Coronavirus 2 NEGATIVE NEGATIVE  Lactic acid, plasma     Status: Abnormal   Collection Time: 05/15/18  4:48 PM  Result Value Ref Range   Lactic Acid, Venous 2.5 (HH) 0.5 - 1.9 mmol/L  Influenza panel by PCR (type A & B)     Status: None   Collection Time: 05/15/18  4:48 PM  Result Value Ref Range   Influenza A By PCR NEGATIVE NEGATIVE   Influenza B By PCR NEGATIVE NEGATIVE  Brain natriuretic peptide     Status: Abnormal   Collection Time: 05/15/18  4:48 PM  Result Value Ref Range   B Natriuretic Peptide 519.0 (H) 0.0 - 100.0 pg/mL   ____________________________________________  EKG My review and personal interpretation at Time: 16:09   Indication: chest pain  Rate: 120  Rhythm: sinus Axis: normal Other: no stemi, lvh criteria, nonspecific st abn ____________________________________________  RADIOLOGY  I personally reviewed all radiographic images ordered to evaluate for the above acute complaints and reviewed radiology reports and findings.  These findings were personally discussed with the patient.  Please see medical record for radiology report.  ____________________________________________   PROCEDURES  Procedure(s) performed:  .Critical Care Performed by: Willy Eddy, MD Authorized by: Willy Eddy, MD   Critical care provider statement:    Critical care time (minutes):  40   Critical care time was exclusive of:  Separately  billable procedures and treating other patients   Critical care was necessary to treat or prevent imminent or life-threatening deterioration of the following conditions:  Respiratory failure and sepsis   Critical care was time spent personally by me on the following activities:  Development of treatment plan with patient or surrogate, discussions with consultants, evaluation of patient's response to treatment, examination of patient, obtaining history from patient or surrogate, ordering and performing treatments and interventions, ordering and review of laboratory studies, ordering and review of radiographic studies, pulse oximetry, re-evaluation of patient's condition and review of old charts      Critical Care performed: yes ____________________________________________   INITIAL IMPRESSION / ASSESSMENT AND PLAN / ED COURSE  Pertinent labs & imaging results that were available during my care of the patient were reviewed by me and considered in my medical decision making (see chart for details).   DDX: sepsis, pna, chf, copd, asthma, bronchitis, ili, covid  KONRAD DESAI is a 43 y.o. who presents to the ED with symptoms as described above.  Patient febrile tachycardic mild tachypnea but no hypoxia initially.  Does have crackles and diminished breath sounds.  Diaphoretic concerning for cardiac illness possible sepsis.  The patient will be placed on continuous pulse oximetry and telemetry for monitoring.  Laboratory evaluation will be sent to evaluate for the above complaints.     Clinical Course as of May 15 1851  Mon May 15, 2018  1820 COVID is negative.  Given possible component of heart failure will give low-dose Lasix as well as antihypertensive medication as he remains hypertensive and that may be precipitating some of his respiratory issues.  He is now requiring supplemental oxygen.  Will discuss with hospitalist for admission.  Have discussed with the patient and available family all  diagnostics and treatments performed thus far and all questions were answered to the best of my ability. The patient demonstrates understanding and agreement with plan.    [PR]  1836 Patient with worsening tachypnea tachycardia concern for CHF versus pneumonia.  Will place on BiPAP.  Admitting  physician at bedside agrees with plan.   [PR]  1850 Patient much more comfortable on BiPAP.   [PR]    Clinical Course User Index [PR] Willy Eddy, MD    The patient was evaluated in Emergency Department today for the symptoms described in the history of present illness. He/she was evaluated in the context of the global COVID-19 pandemic, which necessitated consideration that the patient might be at risk for infection with the SARS-CoV-2 virus that causes COVID-19. Institutional protocols and algorithms that pertain to the evaluation of patients at risk for COVID-19 are in a state of rapid change based on information released by regulatory bodies including the CDC and federal and state organizations. These policies and algorithms were followed during the patient's care in the ED.  As part of my medical decision making, I reviewed the following data within the electronic MEDICAL RECORD NUMBER Nursing notes reviewed and incorporated, Labs reviewed, notes from prior ED visits and  Controlled Substance Database   ____________________________________________   FINAL CLINICAL IMPRESSION(S) / ED DIAGNOSES  Final diagnoses:  Acute respiratory failure with hypoxia (HCC)  Sepsis with acute hypoxic respiratory failure without septic shock, due to unspecified organism [facility])      NEW MEDICATIONS STARTED DURING THIS VISIT:  New Prescriptions   No medications on file     Note:  This document was prepared using Dragon voice recognition software and may include unintentional dictation errors.    Willy Eddy, MD 05/15/18 Carlis Stable    Willy Eddy, MD 05/15/18 [phone]

## 2018-05-15 NOTE — Telephone Encounter (Signed)
Agree with need for ER evaluation for pneumonia and possible COVID-19 testing.

## 2018-05-15 NOTE — H&P (Signed)
[employer] [facility] [place] [facility]   PATIENT NAME: Joshua Mayo    MR#:  [id number]  DATE OF BIRTH:  05-17-1975  DATE OF ADMISSION:  05/15/2018  PRIMARY CARE PHYSICIAN: Chrismon, Jodell Cipro, PA   REQUESTING/REFERRING PHYSICIAN: Roxan Hockey  CHIEF COMPLAINT:   Chief Complaint  Patient presents with  . Shortness of Breath  . Generalized Body Aches  . Diarrhea    HISTORY OF PRESENT ILLNESS: Joshua Mayo  is a 43 y.o. male with a known history of hypertension but not compliant with doctor's visits and does not take any medications.  He is a chronic smoker almost 1 pack cigarettes every day.  He was having shortness of breath with some cough for last 2 to 3 weeks which is progressively getting worse.  He denies any associated fever or chills with that.  He denies any palpitation or chest pain with that.  He had symptoms of orthopnea and had been tossing and turning every night since then. He went to his primary care doctor last week who gave prescription of prednisone and azithromycin it helped just for 1 to 2 days but his symptoms started coming back and getting worse now so came to emergency room. Chest x-ray reported bilateral atypical pneumonia versus edema.  He had low-grade fever, coughing and feeling shortness of breath with tachypnea.  He was noted to be slightly hypoxic and started on oxygen supplementation. His COVID 19 test came negative.  ER physician suspected this could be due to CHF or atypical pneumonia and after giving Lasix injection advised to admit to hospitalist team. During my visit in his room he was severely diaphoretic and distressed with respiratory rate of 35 and oxygen saturation 90% on 3 L nasal cannula oxygen with heart rate of 140.  We decided to start on BiPAP.  PAST MEDICAL HISTORY:   Past Medical History:  Diagnosis Date  . Hypertension     PAST SURGICAL HISTORY: History reviewed. No pertinent surgical history.  SOCIAL HISTORY:  Social  History   Tobacco Use  . Smoking status: Current Every Day Smoker    Packs/day: 1.00    Years: 20.00    Pack years: 20.00    Types: Cigarettes  . Smokeless tobacco: Never Used  Substance Use Topics  . Alcohol use: Yes    Comment: weekends    FAMILY HISTORY:  Family History  Problem Relation Age of Onset  . Heart disease Father   . Heart disease Paternal Grandfather     DRUG ALLERGIES: No Known Allergies  REVIEW OF SYSTEMS:   CONSTITUTIONAL: No fever, fatigue or weakness.  EYES: No blurred or double vision.  EARS, NOSE, AND THROAT: No tinnitus or ear pain.  RESPIRATORY: Have cough, shortness of breath, no wheezing or hemoptysis.  CARDIOVASCULAR: No chest pain, have orthopnea, no edema.  GASTROINTESTINAL: No nausea, vomiting, diarrhea or abdominal pain.  GENITOURINARY: No dysuria, hematuria.  ENDOCRINE: No polyuria, nocturia,  HEMATOLOGY: No anemia, easy bruising or bleeding SKIN: No rash or lesion. MUSCULOSKELETAL: No joint pain or arthritis.   NEUROLOGIC: No tingling, numbness, weakness.   PSYCHIATRY: No anxiety or depression.   MEDICATIONS AT HOME:  Prior to Admission medications   Medication Sig Start Date End Date Taking? Authorizing Provider  acetaminophen (TYLENOL) 500 MG tablet Take 500-1,000 mg by mouth every 6 (six) hours as needed for mild pain or fever.   Yes [provider]  guaiFENesin (MUCINEX) 600 MG 12 hr tablet Take 600 mg by mouth 2 (  two) times daily as needed for cough or to loosen phlegm.   Yes [provider]  Omeprazole 20 MG TBEC Take 40 mg by mouth 2 (two) times daily.    Yes [provider]  pseudoephedrine (SUDAFED) 30 MG tablet Take 30-60 mg by mouth every 6 (six) hours as needed for congestion.   Yes [provider]  azithromycin (ZITHROMAX) 250 MG tablet Take 2 tablets by mouth today then one daily for 4 days. Patient not taking: Reported on 05/15/2018 05/05/18   Chrismon, Jodell Ciproennis E, PA  cyclobenzaprine  (FLEXERIL) 5 MG tablet Take 1 tablet (5 mg total) by mouth 3 (three) times daily as needed for muscle spasms. Patient not taking: Reported on 05/15/2018 10/29/17   Nita SickleVeronese, [place], MD  predniSONE (DELTASONE) 5 MG tablet Taper down by 1 tablet daily starting with 6 by mouth the first day (6,5,4,3,2,1) Patient not taking: Reported on 05/15/2018 05/05/18   Chrismon, Jodell Ciproennis E, PA      PHYSICAL EXAMINATION:   VITAL SIGNS: Blood pressure (!) 126/102, pulse (!) 107, temperature 100.3 F (37.9 C), temperature source Rectal, resp. rate (!) 25, height 5\' 7"  (1.702 m), weight 81.6 kg, SpO2 91 %.  GENERAL:  43 y.o.-year-old patient lying in the bed with acute distress.  EYES: Pupils equal, round, reactive to light and accommodation. No scleral icterus. Extraocular muscles intact.  HEENT: Head atraumatic, normocephalic. Oropharynx and nasopharynx clear.  NECK:  Supple, no jugular venous distention. No thyroid enlargement, no tenderness.  LUNGS: Equal breath sounds bilaterally, no wheezing, bilateral crepitation.  Positive use of accessory muscles of respiration.  Patient was sitting up in the bed with respiration rate up to 35 and excessive diaphoresis. CARDIOVASCULAR: S1, S2 fast and regular. No murmurs, rubs, or gallops.  ABDOMEN: Soft, nontender, nondistended. Bowel sounds present. No organomegaly or mass.  EXTREMITIES: No pedal edema, cyanosis, or clubbing.  NEUROLOGIC: Cranial nerves II through XII are intact. Muscle strength 5/5 in all extremities. Sensation intact. Gait not checked.  PSYCHIATRIC: The patient is alert and oriented x 3.  SKIN: No obvious rash, lesion, or ulcer.   LABORATORY PANEL:   CBC Recent Labs  Lab 05/15/18 1648  WBC 7.6  HGB 13.6  HCT 41.4  PLT 229  MCV 89.8  MCH 29.5  MCHC 32.9  RDW 12.8  LYMPHSABS 2.4  MONOABS 0.4  EOSABS 0.0  BASOSABS 0.1    ------------------------------------------------------------------------------------------------------------------  Chemistries  Recent Labs  Lab 05/15/18 1648  NA 143  K 4.1  CL 107  CO2 22  GLUCOSE 104*  BUN 16  CREATININE 0.85  CALCIUM 9.1  AST 75*  ALT 142*  ALKPHOS 71  BILITOT 1.4*   ------------------------------------------------------------------------------------------------------------------ estimated creatinine clearance is 113.4 mL/min (by C-G formula based on SCr of 0.85 mg/dL). ------------------------------------------------------------------------------------------------------------------ No results for input(s): TSH, T4TOTAL, T3FREE, THYROIDAB in the last 72 hours.  Invalid input(s): FREET3   Coagulation profile No results for input(s): INR, PROTIME in the last 168 hours. ------------------------------------------------------------------------------------------------------------------- No results for input(s): DDIMER in the last 72 hours. -------------------------------------------------------------------------------------------------------------------  Cardiac Enzymes Recent Labs  Lab 05/15/18 1648  TROPONINI <0.03   ------------------------------------------------------------------------------------------------------------------ Invalid input(s): POCBNP  ---------------------------------------------------------------------------------------------------------------  Urinalysis    Component Value Date/Time   BILIRUBINUR neg 11/01/2017 1349   PROTEINUR Negative 11/01/2017 1349   UROBILINOGEN 0.2 11/01/2017 1349   NITRITE neg 11/01/2017 1349   LEUKOCYTESUR Negative 11/01/2017 1349     RADIOLOGY: Dg Chest Portable 1 View  Result Date: 05/15/2018 CLINICAL DATA:  Shortness of breath.  Chest pain. EXAM: PORTABLE CHEST 1 VIEW COMPARISON:  Patient's prior chest x-ray from 2001 could not be retrieved for comparison. FINDINGS: The cardiac  silhouette is enlarged. There is no large pleural effusion. There are diffuse bilateral airspace opacities. No pneumothorax. No large pleural effusion. There is no acute osseous abnormality. IMPRESSION: 1. Diffuse bilateral hazy airspace opacities concerning for multifocal pneumonia (viral or bacterial). Pulmonary edema can have a similar appearance. 2. Cardiomegaly. Electronically Signed   By: Katherine Mantle M.D.   On: 05/15/2018 17:21    EKG: Orders placed or performed during the hospital encounter of 05/15/18  . EKG 12-Lead  . EKG 12-Lead  . EKG 12-Lead  . EKG 12-Lead    IMPRESSION AND PLAN:  *Acute respiratory failure with hypoxia Atypical pneumonia versus acute congestive heart failure Is COVID-19 test is negative.  He has low-grade fever and tachycardia and tachypnea. I will give Rocephin and azithromycin to cover for atypical infections. Also treating his congestive heart failure as he is noncompliant to hypertensive medications and smoker with strong family history of cardiomyopathy. IV Lasix, intake and output measurement, echocardiogram and serial troponins, cardiology consult. Currently requiring BiPAP so we will monitor in stepdown unit.  *Elevated LFTs This could be due to edema secondary to heart failure, follow liver enzymes.  *Active smoking Counseled to quit smoking for 4 minutes and offered nicotine patch.  All the records are reviewed and case discussed with ED provider. Management plans discussed with the patient, family and they are in agreement.  CODE STATUS: Full code.  I spoke to patient's wife on the phone.  TOTAL TIME TAKING CARE OF THIS PATIENT: 50 minutes.    Altamese Dilling M.D on 05/15/2018   Between 7am to 6pm - Pager - [phone]  After 6pm go to [email or web address] - password [email or web address]  [facility] [place] [facility]  Office  [phone]  CC: Primary care physician; Tamsen Roers, PA   Note: This dictation was prepared with  Dragon dictation along with smaller phrase technology. Any transcriptional errors that result from this process are unintentional.

## 2018-05-15 NOTE — Progress Notes (Signed)
ANTICOAGULATION CONSULT NOTE - Initial Consult  Pharmacy Consult for Heparin  Indication: chest pain/ACS  No Known Allergies  Patient Measurements: Height: 5\' 7"  (170.2 cm) Weight: 180 lb (81.6 kg) IBW/kg (Calculated) : 66.1 Heparin Dosing Weight:  81.6 kg   Vital Signs: Temp: 98.5 F (36.9 C) (05/11 2040) Temp Source: Oral (05/11 2040) BP: 152/114 (05/11 2040) Pulse Rate: 131 (05/11 2040)  Labs: Recent Labs    05/15/18 1648  HGB 13.6  HCT 41.4  PLT 229  CREATININE 0.85  TROPONINI <0.03  <0.03    Estimated Creatinine Clearance: 113.4 mL/min (by C-G formula based on SCr of 0.85 mg/dL).   Medical History: Past Medical History:  Diagnosis Date  . Hypertension     Medications:  Medications Prior to Admission  Medication Sig Dispense Refill Last Dose  . acetaminophen (TYLENOL) 500 MG tablet Take 500-1,000 mg by mouth every 6 (six) hours as needed for mild pain or fever.   Unknown at PRN  . guaiFENesin (MUCINEX) 600 MG 12 hr tablet Take 600 mg by mouth 2 (two) times daily as needed for cough or to loosen phlegm.   Unknown at PRN  . Omeprazole 20 MG TBEC Take 40 mg by mouth 2 (two) times daily.    05/14/2018 at Unknown time  . pseudoephedrine (SUDAFED) 30 MG tablet Take 30-60 mg by mouth every 6 (six) hours as needed for congestion.   Unknown at PRN  . azithromycin (ZITHROMAX) 250 MG tablet Take 2 tablets by mouth today then one daily for 4 days. (Patient not taking: Reported on 05/15/2018) 6 tablet 0 Completed Course at Unknown time  . cyclobenzaprine (FLEXERIL) 5 MG tablet Take 1 tablet (5 mg total) by mouth 3 (three) times daily as needed for muscle spasms. (Patient not taking: Reported on 05/15/2018) 30 tablet 0 Completed Course at Unknown time  . predniSONE (DELTASONE) 5 MG tablet Taper down by 1 tablet daily starting with 6 by mouth the first day (6,5,4,3,2,1) (Patient not taking: Reported on 05/15/2018) 21 tablet 0 Completed Course at Unknown time     Assessment: Pharmacy consulted to dose heparin in this 43 year old male admitted with ACS/NSTEMI. CrCl = 113.4 ml/min  Goal of Therapy:  Heparin level 0.3-0.7 units/ml Monitor platelets by anticoagulation protocol: Yes   Plan:  Heparin 5000 units SQ given on 5/11 @ 2053.   Will order Heparin gtt to start at 1000 units/hr.  Will not bolus this pt.  Will order HL 6 hrs after start of drip.   Rahman Ferrall D 05/15/2018,10:00 PM

## 2018-05-15 NOTE — ED Triage Notes (Signed)
Pt reports SOB, weakness, chills and bodyaches for 3 weeks.

## 2018-05-15 NOTE — Progress Notes (Signed)
Transported pt to ICU 10 on Bipap without incident. Pt tol Bipap well at this time and remains on Bipap. Report given to ICU RT.

## 2018-05-15 NOTE — ED Notes (Signed)
ED TO INPATIENT HANDOFF REPORT  ED Nurse Name and Phone #:  Thomos Domine/Gracie [phone]  S Name/Age/Gender Joshua Mayo 43 y.o. male Room/Bed: ED31A/ED31A  Code Status   Code Status: Not on file  Home/SNF/Other Home Patient oriented to: self, place, time and situation Is this baseline? Yes   Triage Complete: Triage complete  Chief Complaint SOB  Triage Note Pt reports SOB, weakness, chills and bodyaches for 3 weeks.   Allergies No Known Allergies  Level of Care/Admitting Diagnosis ED Disposition    ED Disposition Condition Comment   Admit  Hospital Area: [facility] [[id number]]  Level of Care: Stepdown [14]  Covid Evaluation: Person Under Investigation (PUI)  Isolation Risk Level: High Risk/Airborne (Aerosolizing procedure, nebulizer, intubated/ventilation, CPAP/BiPAP)  Diagnosis: Acute respiratory failure with hypoxia [facility]) [[id number]]  Admitting Physician: Altamese Dilling [phone]  Attending Physician: Altamese Dilling [phone]  Estimated length of stay: past midnight tomorrow  Certification:: I certify this patient will need inpatient services for at least 2 midnights  PT Class (Do Not Modify): Inpatient [101]  PT Acc Code (Do Not Modify): Private [1]       B Medical/Surgery History History reviewed. No pertinent past medical history. History reviewed. No pertinent surgical history.   A IV Location/Drains/Wounds Patient Lines/Drains/Airways Status   Active Line/Drains/Airways    Name:   Placement date:   Placement time:   Site:   Days:   Peripheral IV 05/15/18 Left Antecubital   05/15/18    1645    Antecubital   less than 1   Peripheral IV 05/15/18 Right Antecubital   05/15/18    1650    Antecubital   less than 1          Intake/Output Last 24 hours No intake or output data in the 24 hours ending 05/15/18 1901  Labs/Imaging Results for orders placed or performed during the hospital encounter of 05/15/18 (from the past  48 hour(s))  CBC with Differential/Platelet     Status: None   Collection Time: 05/15/18  4:48 PM  Result Value Ref Range   WBC 7.6 4.0 - 10.5 K/uL   RBC 4.61 4.22 - 5.81 MIL/uL   Hemoglobin 13.6 13.0 - 17.0 g/dL   HCT 58.8 50.2 - 77.4 %   MCV 89.8 80.0 - 100.0 fL   MCH 29.5 26.0 - 34.0 pg   MCHC 32.9 30.0 - 36.0 g/dL   RDW 12.8 78.6 - 76.7 %   Platelets 229 150 - 400 K/uL   nRBC 0.0 0.0 - 0.2 %   Neutrophils Relative % 62 %   Neutro Abs 4.7 1.7 - 7.7 K/uL   Lymphocytes Relative 31 %   Lymphs Abs 2.4 0.7 - 4.0 K/uL   Monocytes Relative 6 %   Monocytes Absolute 0.4 0.1 - 1.0 K/uL   Eosinophils Relative 0 %   Eosinophils Absolute 0.0 0.0 - 0.5 K/uL   Basophils Relative 1 %   Basophils Absolute 0.1 0.0 - 0.1 K/uL   Immature Granulocytes 0 %   Abs Immature Granulocytes 0.01 0.00 - 0.07 K/uL    Comment: Performed at [facility], [address] [place], [place] [address]  Comprehensive metabolic panel     Status: Abnormal   Collection Time: 05/15/18  4:48 PM  Result Value Ref Range   Sodium 143 135 - 145 mmol/L   Potassium 4.1 3.5 - 5.1 mmol/L   Chloride 107 98 - 111 mmol/L   CO2 22 22 - 32 mmol/L  Glucose, Bld 104 (H) 70 - 99 mg/dL   BUN 16 6 - 20 mg/dL   Creatinine, Ser 1.610.85 0.61 - 1.24 mg/dL   Calcium 9.1 8.9 - 09.610.3 mg/dL   Total Protein 8.2 (H) 6.5 - 8.1 g/dL   Albumin 4.5 3.5 - 5.0 g/dL   AST 75 (H) 15 - 41 U/L   ALT 142 (H) 0 - 44 U/L   Alkaline Phosphatase 71 38 - 126 U/L   Total Bilirubin 1.4 (H) 0.3 - 1.2 mg/dL   GFR calc non Af Amer >60 >60 mL/min   GFR calc Af Amer >60 >60 mL/min   Anion gap 14 5 - 15    Comment: Performed at [facility], [address] [place], [place] [address]  Troponin I - ONCE - STAT     Status: None   Collection Time: 05/15/18  4:48 PM  Result Value Ref Range   Troponin I <0.03 <0.03 ng/mL    Comment: Performed at [facility], [address] [place], [place] [address]  SARS Coronavirus 2 (CEPHEID-  Performed in [facility] hospital lab), Hosp Order     Status: None   Collection Time: 05/15/18  4:48 PM  Result Value Ref Range   SARS Coronavirus 2 NEGATIVE NEGATIVE    Comment: (NOTE) If result is NEGATIVE SARS-CoV-2 target nucleic acids are NOT DETECTED. The SARS-CoV-2 RNA is generally detectable in upper and lower  respiratory specimens during the acute phase of infection. The lowest  concentration of SARS-CoV-2 viral copies this assay can detect is 250  copies / mL. A negative result does not preclude SARS-CoV-2 infection  and should not be used as the sole basis for treatment or other  patient management decisions.  A negative result may occur with  improper specimen collection / handling, submission of specimen other  than nasopharyngeal swab, presence of viral mutation(s) within the  areas targeted by this assay, and inadequate number of viral copies  (<250 copies / mL). A negative result must be combined with clinical  observations, patient history, and epidemiological information. If result is POSITIVE SARS-CoV-2 target nucleic acids are DETECTED. The SARS-CoV-2 RNA is generally detectable in upper and lower  respiratory specimens dur ing the acute phase of infection.  Positive  results are indicative of active infection with SARS-CoV-2.  Clinical  correlation with patient history and other diagnostic information is  necessary to determine patient infection status.  Positive results do  not rule out bacterial infection or co-infection with other viruses. If result is PRESUMPTIVE POSTIVE SARS-CoV-2 nucleic acids MAY BE PRESENT.   A presumptive positive result was obtained on the submitted specimen  and confirmed on repeat testing.  While 2019 novel coronavirus  (SARS-CoV-2) nucleic acids may be present in the submitted sample  additional confirmatory testing may be necessary for epidemiological  and / or clinical management purposes  to differentiate between  SARS-CoV-2  and other Sarbecovirus currently known to infect humans.  If clinically indicated additional testing with an alternate test  methodology [phone]([id number]) is advised. The SARS-CoV-2 RNA is generally  detectable in upper and lower respiratory sp ecimens during the acute  phase of infection. The expected result is Negative. Fact Sheet for Patients:  BoilerBrush.com.cyhttps://www.fda.gov/media/136312/download Fact Sheet for Healthcare Providers: https://pope.com/https://www.fda.gov/media/136313/download This test is not yet approved or cleared by the [place] FDA and has been authorized for detection and/or diagnosis of SARS-CoV-2 by FDA under an Emergency Use Authorization (EUA).  This EUA will remain in effect (  meaning this test can be used) for the duration of the COVID-19 declaration under Section 564(b)(1) of the Act, 21 U.S.C. section 360bbb-3(b)(1), unless the authorization is terminated or revoked sooner. Performed at [facility], [address] [place], [place] [address]   Lactic acid, plasma     Status: Abnormal   Collection Time: 05/15/18  4:48 PM  Result Value Ref Range   Lactic Acid, Venous 2.5 (HH) 0.5 - 1.9 mmol/L    Comment: CRITICAL RESULT CALLED TO, READ BACK BY AND VERIFIED WITH Hashim Eichhorst  05/15/18 MJU Performed at [facility], [address] [place], [place] [address]   Influenza panel by PCR (type A & B)     Status: None   Collection Time: 05/15/18  4:48 PM  Result Value Ref Range   Influenza A By PCR NEGATIVE NEGATIVE   Influenza B By PCR NEGATIVE NEGATIVE    Comment: (NOTE) The Xpert Xpress Flu assay is intended as an aid in the diagnosis of  influenza and should not be used as a sole basis for treatment.  This  assay is FDA approved for nasopharyngeal swab specimens only. Nasal  washings and aspirates are unacceptable for Xpert Xpress Flu testing. Performed at [facility], [address] [place], [place] [address]   Brain natriuretic peptide      Status: Abnormal   Collection Time: 05/15/18  4:48 PM  Result Value Ref Range   B Natriuretic Peptide 519.0 (H) 0.0 - 100.0 pg/mL    Comment: Performed at [facility], [address], [place], [place] [address]   Dg Chest Portable 1 View  Result Date: 05/15/2018 CLINICAL DATA:  Shortness of breath.  Chest pain. EXAM: PORTABLE CHEST 1 VIEW COMPARISON:  Patient's prior chest x-ray from 2001 could not be retrieved for comparison. FINDINGS: The cardiac silhouette is enlarged. There is no large pleural effusion. There are diffuse bilateral airspace opacities. No pneumothorax. No large pleural effusion. There is no acute osseous abnormality. IMPRESSION: 1. Diffuse bilateral hazy airspace opacities concerning for multifocal pneumonia (viral or bacterial). Pulmonary edema can have a similar appearance. 2. Cardiomegaly. Electronically Signed   By: Katherine Mantle M.D.   On: 05/15/2018 17:21    Pending Labs Unresulted Labs (From admission, onward)    Start     Ordered   05/15/18 1850  C-reactive protein  Once,   STAT     05/15/18 1850   05/15/18 1850  Ferritin  Add-on,   AD     05/15/18 1850   05/15/18 1850  Fibrin derivatives D-Dimer ([facility] only)  Add-on,   AD     05/15/18 1850   05/15/18 1844  Lactic acid, plasma  ONCE - STAT,   STAT     05/15/18 1844   05/15/18 1641  Blood culture (routine x 2)  BLOOD CULTURE X 2,   STAT     05/15/18 1640   Signed and Held  HIV antibody (Routine Testing)  Once,   R     Signed and Held   Signed and Held  Basic metabolic panel  Tomorrow morning,   R     Signed and Held   Signed and Held  CBC  Tomorrow morning,   R     Signed and Held   Signed and Held  CBC  (heparin)  Once,   R    Comments:  Baseline for heparin therapy IF NOT ALREADY DRAWN.  Notify MD if PLT < 100 K.  Signed and Held   Signed and Held  Creatinine, serum  (heparin)  Once,   R    Comments:  Baseline for heparin therapy IF NOT ALREADY DRAWN.    Signed and Held           Vitals/Pain Today's Vitals   05/15/18 1813 05/15/18 1821 05/15/18 1833 05/15/18 1850  BP:  (!) 178/138 (!) 158/91 (!) 126/102  Pulse:  (!) 143 (!) 130 (!) 107  Resp:  (!) 31 (!) 31 (!) 25  Temp:      TempSrc:      SpO2:  91% 91% 91%  Weight:      Height:      PainSc: 4        Isolation Precautions Droplet precaution  Medications Medications  cefTRIAXone (ROCEPHIN) 2 g in sodium chloride 0.9 % 100 mL IVPB (2 g Intravenous New Bag/Given 05/15/18 1749)  azithromycin (ZITHROMAX) 500 mg in sodium chloride 0.9 % 250 mL IVPB (500 mg Intravenous New Bag/Given 05/15/18 1809)  ondansetron (ZOFRAN) injection 4 mg (4 mg Intravenous Given 05/15/18 1645)  acetaminophen (TYLENOL) tablet 1,000 mg (1,000 mg Oral Given 05/15/18 1702)  sodium chloride 0.9 % bolus 500 mL (500 mLs Intravenous New Bag/Given 05/15/18 1704)  labetalol (NORMODYNE) injection 5 mg (5 mg Intravenous Given 05/15/18 1827)  furosemide (LASIX) injection 20 mg (20 mg Intravenous Given 05/15/18 1827)  metoprolol tartrate (LOPRESSOR) injection 5 mg (5 mg Intravenous Given 05/15/18 1838)  ondansetron (ZOFRAN) injection 4 mg (4 mg Intravenous Given 05/15/18 1844)    Mobility walks Low fall risk   Focused Assessments See assessments   R Recommendations: See Admitting Provider Note  Report given to:   Additional Notes:  Bipap, 1856 increased FiO2 from 50% to 60% per RT.

## 2018-05-15 NOTE — ED Notes (Signed)
Admitting MD at bedside at this time. Pt noted to be increasingly diaphoretic at this time, O2 sats 91% on 2L via [place]. Pt turned up to 3L via [place], admitting MD aware.

## 2018-05-15 NOTE — ED Notes (Signed)
MD aware pt's O2 sats 89% on RA. Pt placed on 2L via [place] by Marisue Humble, RN.

## 2018-05-15 NOTE — ED Notes (Signed)
This nurse got call from ICU that NP suspects false negative with COVID test, pt to be transported on high flow [place]. This nurse was in pts room when ICU called, was unable to call respiratory at that moment. When this nurse came out of room pt was not in room, already being transported on bipap. Attempted to call ICU nurse at this time, was on hold for 5 minutes with no answer.

## 2018-05-15 NOTE — ED Notes (Signed)
Date and time results received: 05/15/18 5:22 PM  (use smartphrase ".now" to insert current time)  Test: Lactic Critical Value: 2.5  Name of Provider Notified: Roxan Hockey, MD  Orders Received? Or Actions Taken?: Critical Results Acknowledged

## 2018-05-15 NOTE — ED Notes (Signed)
Pt's FIO2 increased to 60% on BiPap due to sats consistently 90% on 50% FiO2.

## 2018-05-16 ENCOUNTER — Inpatient Hospital Stay: Admit: 2018-05-16 | Payer: BLUE CROSS/BLUE SHIELD

## 2018-05-16 ENCOUNTER — Inpatient Hospital Stay (HOSPITAL_COMMUNITY)
Admit: 2018-05-16 | Discharge: 2018-05-16 | Disposition: A | Discharge status: Home / Self Care | Payer: BLUE CROSS/BLUE SHIELD | Attending: Internal Medicine | Admitting: Internal Medicine

## 2018-05-16 ENCOUNTER — Inpatient Hospital Stay: Payer: BLUE CROSS/BLUE SHIELD

## 2018-05-16 DIAGNOSIS — Z791 Long term (current) use of non-steroidal anti-inflammatories (NSAID): Secondary | ICD-10-CM

## 2018-05-16 DIAGNOSIS — Z91018 Allergy to other foods: Secondary | ICD-10-CM

## 2018-05-16 DIAGNOSIS — J9601 Acute respiratory failure with hypoxia: Secondary | ICD-10-CM

## 2018-05-16 DIAGNOSIS — F17210 Nicotine dependence, cigarettes, uncomplicated: Secondary | ICD-10-CM

## 2018-05-16 DIAGNOSIS — I10 Essential (primary) hypertension: Secondary | ICD-10-CM

## 2018-05-16 DIAGNOSIS — R918 Other nonspecific abnormal finding of lung field: Secondary | ICD-10-CM

## 2018-05-16 DIAGNOSIS — I340 Nonrheumatic mitral (valve) insufficiency: Secondary | ICD-10-CM

## 2018-05-16 LAB — ECHOCARDIOGRAM COMPLETE
Height: 67 in
Weight: 2880 oz

## 2018-05-16 LAB — LIPID PANEL
Cholesterol: 239 mg/dL — ABNORMAL HIGH (ref 0–200)
HDL: 100 mg/dL (ref >40)
LDL Cholesterol: 120 mg/dL — ABNORMAL HIGH (ref 0–99)
Total CHOL/HDL Ratio: 2.4 RATIO
Triglycerides: 95 mg/dL (ref <150)
VLDL: 19 mg/dL (ref 0–40)

## 2018-05-16 LAB — COMPREHENSIVE METABOLIC PANEL
ALT: 176 U/L — ABNORMAL HIGH (ref 0–44)
AST: 109 U/L — ABNORMAL HIGH (ref 15–41)
Albumin: 4.2 g/dL (ref 3.5–5.0)
Alkaline Phosphatase: 70 U/L (ref 38–126)
Anion gap: 13 (ref 5–15)
BUN: 13 mg/dL (ref 6–20)
CO2: 23 mmol/L (ref 22–32)
Calcium: 8.6 mg/dL — ABNORMAL LOW (ref 8.9–10.3)
Chloride: 102 mmol/L (ref 98–111)
Creatinine, Ser: 0.86 mg/dL (ref 0.61–1.24)
GFR calc Af Amer: >60 mL/min (ref >60)
GFR calc non Af Amer: >60 mL/min (ref >60)
Glucose, Bld: 101 mg/dL — ABNORMAL HIGH (ref 70–99)
Potassium: 3.4 mmol/L — ABNORMAL LOW (ref 3.5–5.1)
Sodium: 138 mmol/L (ref 135–145)
Total Bilirubin: 1.6 mg/dL — ABNORMAL HIGH (ref 0.3–1.2)
Total Protein: 7.4 g/dL (ref 6.5–8.1)

## 2018-05-16 LAB — CBC
HCT: 40.5 % (ref 39.0–52.0)
Hemoglobin: 13.2 g/dL (ref 13.0–17.0)
MCH: 29.1 pg (ref 26.0–34.0)
MCHC: 32.6 g/dL (ref 30.0–36.0)
MCV: 89.4 fL (ref 80.0–100.0)
Platelets: 217 10*3/uL (ref 150–400)
RBC: 4.53 MIL/uL (ref 4.22–5.81)
RDW: 12.7 % (ref 11.5–15.5)
WBC: 8.5 10*3/uL (ref 4.0–10.5)
nRBC: 0 % (ref 0.0–0.2)

## 2018-05-16 LAB — HEPARIN LEVEL (UNFRACTIONATED)
Heparin Unfractionated: 0.27 IU/mL — ABNORMAL LOW (ref 0.30–0.70)
Heparin Unfractionated: 0.32 IU/mL (ref 0.30–0.70)
Heparin Unfractionated: 0.3 IU/mL (ref 0.30–0.70)

## 2018-05-16 LAB — HEMOGLOBIN A1C
Hgb A1c MFr Bld: 4.6 % — ABNORMAL LOW (ref 4.8–5.6)
Mean Plasma Glucose: 85.32 mg/dL

## 2018-05-16 LAB — BRAIN NATRIURETIC PEPTIDE: B Natriuretic Peptide: 955 pg/mL — ABNORMAL HIGH (ref 0.0–100.0)

## 2018-05-16 LAB — SARS CORONAVIRUS 2 BY RT PCR (HOSPITAL ORDER, PERFORMED IN ~~LOC~~ HOSPITAL LAB): SARS Coronavirus 2: NEGATIVE

## 2018-05-16 LAB — PROCALCITONIN: Procalcitonin: 0.11 ng/mL

## 2018-05-16 LAB — TROPONIN I
Troponin I: 0.16 ng/mL (ref <0.03)
Troponin I: 0.54 ng/mL (ref <0.03)

## 2018-05-16 LAB — PROTIME-INR
INR: 1.1 (ref 0.8–1.2)
Prothrombin Time: 14.2 seconds (ref 11.4–15.2)

## 2018-05-16 LAB — EXPECTORATED SPUTUM ASSESSMENT W GRAM STAIN, RFLX TO RESP C

## 2018-05-16 LAB — FIBRIN DERIVATIVES D-DIMER (ARMC ONLY): Fibrin derivatives D-dimer (ARMC): 1204.8 ng/mL (FEU) — ABNORMAL HIGH (ref 0.00–499.00)

## 2018-05-16 LAB — FERRITIN: Ferritin: 601 ng/mL — ABNORMAL HIGH (ref 24–336)

## 2018-05-16 LAB — STREP PNEUMONIAE URINARY ANTIGEN: Strep Pneumo Urinary Antigen: NEGATIVE

## 2018-05-16 MED ORDER — POTASSIUM CHLORIDE CRYS ER 20 MEQ PO TBCR
40.0000 meq | EXTENDED_RELEASE_TABLET | Freq: Two times a day (BID) | ORAL | Status: AC
  Administered 2018-05-16 (×2): 40 meq via ORAL
  Filled 2018-05-16 (×2): qty 2

## 2018-05-16 MED ORDER — POTASSIUM CHLORIDE CRYS ER 20 MEQ PO TBCR
20.0000 meq | EXTENDED_RELEASE_TABLET | ORAL | Status: AC
  Administered 2018-05-16 (×2): 20 meq via ORAL
  Filled 2018-05-16 (×2): qty 1

## 2018-05-16 MED ORDER — METOPROLOL TARTRATE 25 MG PO TABS
25.0000 mg | ORAL_TABLET | Freq: Two times a day (BID) | ORAL | Status: DC
  Administered 2018-05-16 – 2018-05-18 (×6): 25 mg via ORAL
  Filled 2018-05-16 (×6): qty 1

## 2018-05-16 MED ORDER — BENZONATATE 100 MG PO CAPS
100.0000 mg | ORAL_CAPSULE | Freq: Two times a day (BID) | ORAL | Status: DC | PRN
  Administered 2018-05-16 – 2018-05-18 (×5): 100 mg via ORAL
  Filled 2018-05-16 (×5): qty 1

## 2018-05-16 MED ORDER — HEPARIN BOLUS VIA INFUSION
1000.0000 [IU] | Freq: Once | INTRAVENOUS | Status: AC
  Administered 2018-05-16: 1000 [IU] via INTRAVENOUS
  Filled 2018-05-16: qty 1000

## 2018-05-16 MED ORDER — FUROSEMIDE 10 MG/ML IJ SOLN
20.0000 mg | Freq: Once | INTRAMUSCULAR | Status: AC
  Administered 2018-05-16: 20 mg via INTRAVENOUS
  Filled 2018-05-16: qty 2

## 2018-05-16 MED ORDER — GUAIFENESIN-DM 100-10 MG/5ML PO SYRP
5.0000 mL | ORAL_SOLUTION | ORAL | Status: DC | PRN
  Administered 2018-05-17 – 2018-05-18 (×4): 5 mL via ORAL
  Filled 2018-05-16 (×4): qty 5

## 2018-05-16 NOTE — Consult Note (Signed)
NAME: Joshua Mayo  DOB: 09-30-1975  MRN: [id number]  Date/Time: 05/16/2018 5:28 PM  REQUESTING PROVIDER: Dr. Darrol Angel Subjective:  REASON FOR CONSULT: Rule out COVID-19 illness ? Joshua Mayo is a 43 y.o. male with a history of hypertension not on any medication is admitted with 3-week history of shortness of breath and cough. 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  He says he developed shortness of breath 3 weeks ago which he says gradually got worse and had a virtual visit with his primary care on 05/05/2018 provider who gave him azithromycin and steroid for possible sinus infection.  He felt better and with his follow-up visit with this provider on 05/09/2018 COVID  was suspected but as he was feeling well he was asked to stay at the hospital as per patient he never had any fever.  His shortness of breath has been worse on lying flat time and also on exertion with walking upstairs or walking fast.  He does not have any pedal edema.  He had palpitations.  He also had a dry cough not significant sputum.  He lives with his wife and son and they have not been sick.  None of his coworkers have been sick.  Patient is to develop some chest tightness and called his PCP and they asked him to go get evaluated in the ED. In the ED his blood pressure was initially 158/91 and later it increased to 126 x 102 His pulse rate was 130 and his respiratory rate was 31 and his oxygen was 91%..  COVID test was done and it was negative.  It was repeated again yesterday and it was negative He had a chest x-ray which revealed bilateral infiltrates questioning either atypical pneumonia or fluid overload.  Patient says he drinks alcohol quite a bit 3 cans of beer +2-3 shots of whiskey a day.  He also takes ibuprofen every day.  He also smokes a pack a day. He has been started on azithromycin and ceftriaxone IV and also received until today 80 mg of Lasix.  Patient says  he is feeling much better.  He is passed a lot of urine. Past Medical History:  Diagnosis Date  . Hypertension     History reviewed. No pertinent surgical history.   Social history Lives with his wife and son Smoker, no vaping Alcohol use Occasional marijuana   Family History  Problem Relation Age of Onset  . Heart disease Father   . Heart disease Paternal Grandfather    Allergies  Allergen Reactions  . Banana Anaphylaxis    ? Current Facility-Administered Medications  Medication Dose Route Frequency Provider Last Rate Last Dose  . azithromycin (ZITHROMAX) 500 mg in sodium chloride 0.9 % 250 mL IVPB  500 mg Intravenous Q24H Willy Eddy, MD   Stopped at 05/15/18 1928  . cefTRIAXone (ROCEPHIN) 2 g in sodium chloride 0.9 % 100 mL IVPB  2 g Intravenous Q24H Willy Eddy, MD 200 mL/hr at 05/16/18 1711 2 g at 05/16/18 1711  . colchicine tablet 0.6 mg  0.6 mg Oral BID Eugenie Norrie, NP   0.6 mg at 05/16/18 0817  . docusate sodium (COLACE) capsule 100 mg  100 mg Oral BID PRN Altamese Dilling, MD      . heparin ADULT infusion 100 units/mL (25000 units/245mL sodium chloride 0.45%)  1,150 Units/hr Intravenous Continuous Hallaji, Sheema M, RPH 11.5 mL/hr at 05/16/18 1557 1,150  Units/hr at 05/16/18 1557  . hydrALAZINE (APRESOLINE) injection 10-20 mg  10-20 mg Intravenous Q4H PRN Eugenie Norrie, NP   10 mg at 05/16/18 0817  . metoprolol tartrate (LOPRESSOR) injection 5 mg  5 mg Intravenous Q6H PRN Altamese Dilling, MD      . metoprolol tartrate (LOPRESSOR) tablet 25 mg  25 mg Oral BID Merwyn Katos, MD   25 mg at 05/16/18 1337  . potassium chloride SA (K-DUR) CR tablet 40 mEq  40 mEq Oral BID Merwyn Katos, MD   40 mEq at 05/16/18 1337     Abtx:  Anti-infectives (From admission, onward)   Start     Dose/Rate Route Frequency Ordered Stop   05/15/18 1730  cefTRIAXone (ROCEPHIN) 2 g in sodium chloride 0.9 % 100 mL IVPB     2 g 200 mL/hr over 30 Minutes  Intravenous Every 24 hours 05/15/18 1724     05/15/18 1730  azithromycin (ZITHROMAX) 500 mg in sodium chloride 0.9 % 250 mL IVPB     500 mg 250 mL/hr over 60 Minutes Intravenous Every 24 hours 05/15/18 1724        REVIEW OF SYSTEMS:  Const: negative fever, negative chills, negative weight loss Eyes: negative diplopia or visual changes, negative eye pain ENT: negative coryza, negative sore throat Resp: ++ cough, -hemoptysis, ++dyspnea,PND Cards:chest tightness , palpitations, No lower extremity edema GU: negative for frequency, dysuria and hematuria GI: Negative for abdominal pain, 1 day of diarrhea , no bleeding, constipation Skin: negative for rash and pruritus Heme: negative for easy bruising and gum/nose bleeding MS: Generalized weakness Neurolo:negative for headaches, dizziness, vertigo, memory problems  Psych: negative for feelings of anxiety, depression  Endocrine: No polyuria or polydipsia Allergy/Immunology- negative for any medication or food allergies ? Objective:  VITALS:  BP (!) 135/105   Pulse (!) 141   Temp 99 F (37.2 C) (Oral)   Resp (!) 28   Ht  (1.702 m)   Wt 81.6 kg   SpO2 (!) 66%   BMI 28.19 kg/m  PHYSICAL EXAM:  General: Alert, cooperative, no distress, appears stated age.  Head: Normocephalic, without obvious abnormality, atraumatic. Eyes: Conjunctivae clear, anicteric sclerae. Pupils are equal ENT Nares normal. No drainage or sinus tenderness. Lips, mucosa, and tongue normal. No Thrush Neck: Supple, symmetrical, no adenopathy, thyroid: non tender no carotid bruit and no JVD. Back: No CVA tenderness. Lungs: Bilateral air entry Crypts in the bases. Heart: S1-S2 tachycardia Abdomen: Soft, non-tender,not distended. Bowel sounds normal. No masses Extremities: atraumatic, no cyanosis. No edema. No clubbing Skin: No rashes or lesions. Or bruising Lymph: Cervical, supraclavicular normal. Neurologic: Grossly non-focal Pertinent Labs Lab Results  CBC    Component Value Date/Time   WBC 8.5 05/16/2018 0359   RBC 4.53 05/16/2018 0359   HGB 13.2 05/16/2018 0359   HGB 13.4 01/12/2016 1503   HCT 40.5 05/16/2018 0359   HCT 41.7 01/12/2016 1503   PLT 217 05/16/2018 0359   PLT 266 01/12/2016 1503   MCV 89.4 05/16/2018 0359   MCV 88 01/12/2016 1503   MCH 29.1 05/16/2018 0359   MCHC 32.6 05/16/2018 0359   RDW 12.7 05/16/2018 0359   RDW 13.6 01/12/2016 1503   LYMPHSABS 2.4 05/15/2018 1648   LYMPHSABS 2.3 01/12/2016 1503   MONOABS 0.4 05/15/2018 1648   EOSABS 0.0 05/15/2018 1648   EOSABS 0.1 01/12/2016 1503   BASOSABS 0.1 05/15/2018 1648   BASOSABS 0.1 01/12/2016 1503    CMP Latest Ref Rng &  Units 05/16/2018 05/15/2018 10/29/2017  Glucose 70 - 99 mg/dL 161(W101(H) 960(A104(H) 540(J109(H)  BUN 6 - 20 mg/dL 13 16 12   Creatinine 0.61 - 1.24 mg/dL 8.110.86 9.140.85 7.821.16  Sodium 135 - 145 mmol/L 138 143 142  Potassium 3.5 - 5.1 mmol/L 3.4(L) 4.1 3.8  Chloride 98 - 111 mmol/L 102 107 104  CO2 22 - 32 mmol/L 23 22 22   Calcium 8.9 - 10.3 mg/dL 9.5(A8.6(L) 9.1 9.9  Total Protein 6.5 - 8.1 g/dL 7.4 2.1(H8.2(H) 0.8(M8.4(H)  Total Bilirubin 0.3 - 1.2 mg/dL 5.7(Q1.6(H) 4.6(N1.4(H) 0.7  Alkaline Phos 38 - 126 U/L 70 71 56  AST 15 - 41 U/L 109(H) 75(H) 89(H)  ALT 0 - 44 U/L 176(H) 142(H) 138(H)      Microbiology: Recent Results (from the past 240 hour(s))  SARS Coronavirus 2 (CEPHEID- Performed in [facility] hospital lab), Hosp Order     Status: None   Collection Time: 05/15/18  4:48 PM  Result Value Ref Range Status   SARS Coronavirus 2 NEGATIVE NEGATIVE Final    Comment: (NOTE) If result is NEGATIVE SARS-CoV-2 target nucleic acids are NOT DETECTED. The SARS-CoV-2 RNA is generally detectable in upper and lower  respiratory specimens during the acute phase of infection. The lowest  concentration of SARS-CoV-2 viral copies this assay can detect is 250  copies / mL. A negative result does not preclude SARS-CoV-2 infection  and should not be used as the sole basis for treatment  or other  patient management decisions.  A negative result may occur with  improper specimen collection / handling, submission of specimen other  than nasopharyngeal swab, presence of viral mutation(s) within the  areas targeted by this assay, and inadequate number of viral copies  (<250 copies / mL). A negative result must be combined with clinical  observations, patient history, and epidemiological information. If result is POSITIVE SARS-CoV-2 target nucleic acids are DETECTED. The SARS-CoV-2 RNA is generally detectable in upper and lower  respiratory specimens dur ing the acute phase of infection.  Positive  results are indicative of active infection with SARS-CoV-2.  Clinical  correlation with patient history and other diagnostic information is  necessary to determine patient infection status.  Positive results do  not rule out bacterial infection or co-infection with other viruses. If result is PRESUMPTIVE POSTIVE SARS-CoV-2 nucleic acids MAY BE PRESENT.   A presumptive positive result was obtained on the submitted specimen  and confirmed on repeat testing.  While 2019 novel coronavirus  (SARS-CoV-2) nucleic acids may be present in the submitted sample  additional confirmatory testing may be necessary for epidemiological  and / or clinical management purposes  to differentiate between  SARS-CoV-2 and other Sarbecovirus currently known to infect humans.  If clinically indicated additional testing with an alternate test  methodology [phone]([id number]) is advised. The SARS-CoV-2 RNA is generally  detectable in upper and lower respiratory sp ecimens during the acute  phase of infection. The expected result is Negative. Fact Sheet for Patients:  BoilerBrush.com.cyhttps://www.fda.gov/media/136312/download Fact Sheet for Healthcare Providers: https://pope.com/https://www.fda.gov/media/136313/download This test is not yet approved or cleared by the [place] FDA and has been authorized for detection and/or diagnosis of  SARS-CoV-2 by FDA under an Emergency Use Authorization (EUA).  This EUA will remain in effect (meaning this test can be used) for the duration of the COVID-19 declaration under Section 564(b)(1) of the Act, 21 U.S.C. section 360bbb-3(b)(1), unless the authorization is terminated or revoked sooner. Performed at [facility], [address] [place],  [place] [address]   Blood culture (routine x 2)     Status: None (Preliminary result)   Collection Time: 05/15/18  4:48 PM  Result Value Ref Range Status   Specimen Description BLOOD RIGHT ANTECUBITAL  Final   Special Requests   Final    BOTTLES DRAWN AEROBIC AND ANAEROBIC Blood Culture adequate volume   Culture   Final    NO GROWTH < 24 HOURS Performed at [facility], [address], [place], [place] [address]    Report Status PENDING  Incomplete  Blood culture (routine x 2)     Status: None (Preliminary result)   Collection Time: 05/15/18  4:48 PM  Result Value Ref Range Status   Specimen Description BLOOD LEFT ANTECUBITAL  Final   Special Requests   Final    BOTTLES DRAWN AEROBIC AND ANAEROBIC Blood Culture results may not be optimal due to an excessive volume of blood received in culture bottles   Culture   Final    NO GROWTH < 24 HOURS Performed at [facility], [address], [place], [place] [address]    Report Status PENDING  Incomplete  MRSA PCR Screening     Status: None   Collection Time: 05/15/18  8:55 PM  Result Value Ref Range Status   MRSA by PCR NEGATIVE NEGATIVE Final    Comment:        The GeneXpert MRSA Assay (FDA approved for NASAL specimens only), is one component of a comprehensive MRSA colonization surveillance program. It is not intended to diagnose MRSA infection nor to guide or monitor treatment for MRSA infections. Performed at [facility], [address] [place], [place] [address]   Expectorated sputum assessment w rflx to resp cult     Status: None    Collection Time: 05/16/18  6:00 AM  Result Value Ref Range Status   Specimen Description SPUTUM  Final   Special Requests NONE  Final   Sputum evaluation   Final    THIS SPECIMEN IS ACCEPTABLE FOR SPUTUM CULTURE Performed at [facility], [address] [place], [place] [address]    Report Status 05/16/2018 FINAL  Final  Culture, respiratory     Status: None (Preliminary result)   Collection Time: 05/16/18  6:00 AM  Result Value Ref Range Status   Specimen Description   Final    SPUTUM Performed at [facility], [address], [place], [place] [address]    Special Requests   Final    NONE Reflexed from [id number] Performed at [facility], [address] [place], [place] [address]    Gram Stain   Final    NO WBC SEEN FEW GRAM NEGATIVE COCCOBACILLI FEW GRAM POSITIVE COCCI IN PAIRS RARE GRAM POSITIVE RODS Performed at [facility], [address], [place], [place] [address]    Culture PENDING  Incomplete   Report Status PENDING  Incomplete  SARS Coronavirus 2 [facility] order, Performed in [facility] hospital lab)     Status: None   Collection Time: 05/16/18  8:25 AM  Result Value Ref Range Status   SARS Coronavirus 2 NEGATIVE NEGATIVE Final    Comment: (NOTE) If result is NEGATIVE SARS-CoV-2 target nucleic acids are NOT DETECTED. The SARS-CoV-2 RNA is generally detectable in upper and lower  respiratory specimens during the acute phase of infection. The lowest  concentration of SARS-CoV-2 viral copies this assay can detect is 250  copies / mL. A negative result does not  preclude SARS-CoV-2 infection  and should not be used as the sole basis for treatment or other  patient management decisions.  A negative result may occur with  improper specimen collection / handling, submission of specimen other  than nasopharyngeal swab, presence of viral mutation(s) within the  areas targeted by this assay, and inadequate number of viral copies  (<250  copies / mL). A negative result must be combined with clinical  observations, patient history, and epidemiological information. If result is POSITIVE SARS-CoV-2 target nucleic acids are DETECTED. The SARS-CoV-2 RNA is generally detectable in upper and lower  respiratory specimens dur ing the acute phase of infection.  Positive  results are indicative of active infection with SARS-CoV-2.  Clinical  correlation with patient history and other diagnostic information is  necessary to determine patient infection status.  Positive results do  not rule out bacterial infection or co-infection with other viruses. If result is PRESUMPTIVE POSTIVE SARS-CoV-2 nucleic acids MAY BE PRESENT.   A presumptive positive result was obtained on the submitted specimen  and confirmed on repeat testing.  While 2019 novel coronavirus  (SARS-CoV-2) nucleic acids may be present in the submitted sample  additional confirmatory testing may be necessary for epidemiological  and / or clinical management purposes  to differentiate between  SARS-CoV-2 and other Sarbecovirus currently known to infect humans.  If clinically indicated additional testing with an alternate test  methodology [phone]) is advised. The SARS-CoV-2 RNA is generally  detectable in upper and lower respiratory sp ecimens during the acute  phase of infection. The expected result is Negative. Fact Sheet for Patients:  BoilerBrush.com.cy Fact Sheet for Healthcare Providers: https://pope.com/ This test is not yet approved or cleared by the [place] FDA and has been authorized for detection and/or diagnosis of SARS-CoV-2 by FDA under an Emergency Use Authorization (EUA).  This EUA will remain in effect (meaning this test can be used) for the duration of the COVID-19 declaration under Section 564(b)(1) of the Act, 21 U.S.C. section 360bbb-3(b)(1), unless the authorization is terminated or revoked  sooner. Performed at [facility], [address] [place], [place] [address]     IMAGING RESULTS:   I have personally reviewed the films ? Impression/Recommendation ?43 y.o. male with a history of hypertension not on any medication is admitted with 3-week history of shortness of breath and cough.  ?Acute hypoxic respiratory failure: With uncontrolled hypertension not on treatment tachycardia chest x-ray showing bilateral infiltrates with cephalization of veins this looks more like congestive heart failure.  He also has BNP about 900.  Slight for trending troponin. Seen by cardiology  COVID-19 illness is in the differential diagnosis but he has not had any fever and his family members are not sick. Repeat chest x-ray tomorrow to see whether 80 mg of Lasix which he has had over the past 48 hours cleared the weights.  If so this is more in favor of congestive heart failure.  Also follow 2D echo.  Unlikely this is bacterial pneumonia.  He is currently on ceftriaxone and azithromycin.  Recommend also procalcitonin is 0.11. ?Discussed the management with the patient and his nurse.  Note:  This document was prepared using Dragon voice recognition software and may include unintentional dictation errors.

## 2018-05-16 NOTE — Progress Notes (Addendum)
ANTICOAGULATION CONSULT NOTE - Initial Consult  Pharmacy Consult for Heparin  Indication: chest pain/ACS  No Known Allergies  Patient Measurements: Height: 5\' 7"  (170.2 cm) Weight: 180 lb (81.6 kg) IBW/kg (Calculated) : 66.1 Heparin Dosing Weight:  81.6 kg   Vital Signs: Temp: 99.4 F (37.4 C) (05/12 0200) Temp Source: Oral (05/12 0200) BP: 137/96 (05/12 0300) Pulse Rate: 121 (05/12 0300)  Labs: Recent Labs    05/15/18 1648 05/16/18 0359  HGB 13.6 13.2  HCT 41.4 40.5  PLT 229 217  LABPROT  --  14.2  INR  --  1.1  HEPARINUNFRC  --  0.27*  CREATININE 0.85  --   TROPONINI <0.03  <0.03  --     Estimated Creatinine Clearance: 113.4 mL/min (by C-G formula based on SCr of 0.85 mg/dL).   Medical History: Past Medical History:  Diagnosis Date  . Hypertension     Medications:  Medications Prior to Admission  Medication Sig Dispense Refill Last Dose  . acetaminophen (TYLENOL) 500 MG tablet Take 500-1,000 mg by mouth every 6 (six) hours as needed for mild pain or fever.   Unknown at PRN  . guaiFENesin (MUCINEX) 600 MG 12 hr tablet Take 600 mg by mouth 2 (two) times daily as needed for cough or to loosen phlegm.   Unknown at PRN  . Omeprazole 20 MG TBEC Take 40 mg by mouth 2 (two) times daily.    05/14/2018 at Unknown time  . pseudoephedrine (SUDAFED) 30 MG tablet Take 30-60 mg by mouth every 6 (six) hours as needed for congestion.   Unknown at PRN  . azithromycin (ZITHROMAX) 250 MG tablet Take 2 tablets by mouth today then one daily for 4 days. (Patient not taking: Reported on 05/15/2018) 6 tablet 0 Completed Course at Unknown time  . cyclobenzaprine (FLEXERIL) 5 MG tablet Take 1 tablet (5 mg total) by mouth 3 (three) times daily as needed for muscle spasms. (Patient not taking: Reported on 05/15/2018) 30 tablet 0 Completed Course at Unknown time  . predniSONE (DELTASONE) 5 MG tablet Taper down by 1 tablet daily starting with 6 by mouth the first day (6,5,4,3,2,1) (Patient not  taking: Reported on 05/15/2018) 21 tablet 0 Completed Course at Unknown time    Assessment: Pharmacy consulted to dose heparin in this 43 year old male admitted with ACS/NSTEMI. CrCl = 113.4 ml/min  5/11 heparin infusion started @  1000 units/hr 5/12 0359 HL: 0.27  Goal of Therapy:  Heparin level 0.3-0.7 units/ml Monitor platelets by anticoagulation protocol: Yes   Plan:  5/12 @ 0359 HL: 0.27. Level is subtherapeutic. Will order 1000 unit heparin bolus and increase infusion to 1150 units/hr.  Will recheck HL 6 hours after infusion rate change Continue to monitor CBCs daily while on heparin    Gardner Candle, PharmD, BCPS Clinical Pharmacist 05/16/2018 5:04 AM

## 2018-05-16 NOTE — Progress Notes (Signed)
*  PRELIMINARY RESULTS* Echocardiogram 2D Echocardiogram has been performed.  Cristela Blue 05/16/2018, 2:47 PM

## 2018-05-16 NOTE — Progress Notes (Signed)
ANTICOAGULATION CONSULT NOTE - Initial Consult  Pharmacy Consult for Heparin  Indication: chest pain/ACS  Allergies  Allergen Reactions  . Banana Anaphylaxis    Patient Measurements: Height: 5\' 7"  (170.2 cm) Weight: 180 lb (81.6 kg) IBW/kg (Calculated) : 66.1 Heparin Dosing Weight:  81.6 kg   Vital Signs: Temp: 99 F (37.2 C) (05/12 1200) Temp Source: Oral (05/12 1200) BP: 135/105 (05/12 1700) Pulse Rate: 141 (05/12 1700)  Labs: Recent Labs    05/15/18 1648 05/16/18 0359 05/16/18 1100 05/16/18 1714  HGB 13.6 13.2  --   --   HCT 41.4 40.5  --   --   PLT 229 217  --   --   LABPROT  --  14.2  --   --   INR  --  1.1  --   --   HEPARINUNFRC  --  0.27* 0.32 0.30  CREATININE 0.85 0.86  --   --   TROPONINI <0.03  <0.03 0.16* 0.54*  --     Estimated Creatinine Clearance: 112.1 mL/min (by C-G formula based on SCr of 0.86 mg/dL).   Medical History: Past Medical History:  Diagnosis Date  . Hypertension     Medications:  Medications Prior to Admission  Medication Sig Dispense Refill Last Dose  . acetaminophen (TYLENOL) 500 MG tablet Take 500-1,000 mg by mouth every 6 (six) hours as needed for mild pain or fever.   Unknown at PRN  . guaiFENesin (MUCINEX) 600 MG 12 hr tablet Take 600 mg by mouth 2 (two) times daily as needed for cough or to loosen phlegm.   Unknown at PRN  . Omeprazole 20 MG TBEC Take 40 mg by mouth 2 (two) times daily.    05/14/2018 at Unknown time  . pseudoephedrine (SUDAFED) 30 MG tablet Take 30-60 mg by mouth every 6 (six) hours as needed for congestion.   Unknown at PRN  . azithromycin (ZITHROMAX) 250 MG tablet Take 2 tablets by mouth today then one daily for 4 days. (Patient not taking: Reported on 05/15/2018) 6 tablet 0 Completed Course at Unknown time  . cyclobenzaprine (FLEXERIL) 5 MG tablet Take 1 tablet (5 mg total) by mouth 3 (three) times daily as needed for muscle spasms. (Patient not taking: Reported on 05/15/2018) 30 tablet 0 Completed Course  at Unknown time  . predniSONE (DELTASONE) 5 MG tablet Taper down by 1 tablet daily starting with 6 by mouth the first day (6,5,4,3,2,1) (Patient not taking: Reported on 05/15/2018) 21 tablet 0 Completed Course at Unknown time    Assessment: Pharmacy consulted to dose heparin in this 43 year old male admitted with ACS/NSTEMI. CrCl = 113.4 ml/min  5/11 heparin infusion started @  1000 units/hr 5/12 0359 HL: 0.27 5/12 1100 HL: 0.32  Goal of Therapy:  Heparin level 0.3-0.7 units/ml Monitor platelets by anticoagulation protocol: Yes   Plan:  5/12 @ 1100 HL: 0.32. Level is therapeutic. Will continue Heparin infusion at 1150 units/hr.  Will recheck HL in 6 hours for confirmation. Continue to monitor CBCs daily while on heparin   5/12:  HL @ 1714 = 0.3 Will continue pt on current rate and recheck HL on 5/13 with AM labs.    Lachrisha Ziebarth D  05/16/2018 5:53 PM

## 2018-05-16 NOTE — Progress Notes (Signed)
[employer] - [place] at [facility]   PATIENT NAME: Joshua Mayo    MR#:  [id number]  DATE OF BIRTH:  02-05-1975  SUBJECTIVE:   Doing better this morning.  Shortness of breath is improving.  Denies any active chest pain.  REVIEW OF SYSTEMS:  Review of Systems  Constitutional: Negative for chills and fever.  HENT: Negative for congestion and sore throat.   Eyes: Negative for blurred vision and double vision.  Respiratory: Positive for shortness of breath. Negative for cough.   Cardiovascular: Negative for chest pain and palpitations.  Gastrointestinal: Negative for nausea and vomiting.  Genitourinary: Negative for dysuria and urgency.  Musculoskeletal: Negative for back pain and neck pain.  Neurological: Negative for dizziness and headaches.  Psychiatric/Behavioral: Negative for depression. The patient is not nervous/anxious.     DRUG ALLERGIES:  No Known Allergies VITALS:  Blood pressure 133/86, pulse (!) 118, temperature 99 F (37.2 C), temperature source Oral, resp. rate (!) 35, height 5\' 7"  (1.702 m), weight 81.6 kg, SpO2 99 %. PHYSICAL EXAMINATION:  Physical Exam  GENERAL:  Laying in the bed with no acute distress.  HEENT: Head atraumatic, normocephalic. Pupils equal, round, reactive to light and accommodation. No scleral icterus. Extraocular muscles intact. Oropharynx and nasopharynx clear.  NECK:  Supple, no jugular venous distention. No thyroid enlargement. LUNGS: + Bibasilar crackles present. nasal cannula in place. No use of accessory muscles of respiration.  CARDIOVASCULAR: S1, S2 normal. No murmurs, rubs, or gallops.  ABDOMEN: Soft, nontender, nondistended. Bowel sounds present.  EXTREMITIES: No pedal edema, cyanosis, or clubbing.  NEUROLOGIC: CN 2-12 intact, no focal deficits. 5/5 muscle strength throughout all extremities. Sensation intact throughout. Gait not checked.  PSYCHIATRIC: The patient is alert and oriented x 3.  SKIN: No obvious  rash, lesion, or ulcer.  LABORATORY PANEL:  Male CBC Recent Labs  Lab 05/16/18 0359  WBC 8.5  HGB 13.2  HCT 40.5  PLT 217   ------------------------------------------------------------------------------------------------------------------ Chemistries  Recent Labs  Lab 05/16/18 0359  NA 138  K 3.4*  CL 102  CO2 23  GLUCOSE 101*  BUN 13  CREATININE 0.86  CALCIUM 8.6*  AST 109*  ALT 176*  ALKPHOS 70  BILITOT 1.6*   RADIOLOGY:  Dg Chest Portable 1 View  Result Date: 05/15/2018 CLINICAL DATA:  Shortness of breath.  Chest pain. EXAM: PORTABLE CHEST 1 VIEW COMPARISON:  Patient's prior chest x-ray from 2001 could not be retrieved for comparison. FINDINGS: The cardiac silhouette is enlarged. There is no large pleural effusion. There are diffuse bilateral airspace opacities. No pneumothorax. No large pleural effusion. There is no acute osseous abnormality. IMPRESSION: 1. Diffuse bilateral hazy airspace opacities concerning for multifocal pneumonia (viral or bacterial). Pulmonary edema can have a similar appearance. 2. Cardiomegaly. Electronically Signed   By: Katherine Mantle M.D.   On: 05/15/2018 17:21   ASSESSMENT AND PLAN:   Acute respiratory failure with hypoxia- due to atypical pneumonia versus acute congestive heart failure.  Transitioned from BiPAP to 6 L O2 by nasal cannula this morning.  Tolerating well. -COVID negative -Continue ceftriaxone and azithromycin -Continue IV Lasix -Echo pending -Trend troponins -Cardiology consulted  Elevated troponin- demand ischemia versus NSTEMI. troponin 0.16 > 0.54. -Continue to trend troponins -Continue heparin drip -Cardiology consulted  Hypokalemia-replete and recheck  Elevated LFTs- this could be related to heart failure -Monitor -Consider right upper quadrant ultrasound and hepatitis panel  Tobacco use -Counseling performed this admission  All the records are reviewed and case  discussed with Care Management/Social  Worker. Management plans discussed with the patient, family and they are in agreement.  CODE STATUS: Full Code  TOTAL TIME TAKING CARE OF THIS PATIENT: 45 minutes.   More than 50% of the time was spent in counseling/coordination of care: YES  POSSIBLE D/C IN 2-3 DAYS, DEPENDING ON CLINICAL CONDITION.   Jinny BlossomKaty D Mayo M.D on 05/16/2018 at 1:38 PM  Between 7am to 6pm - Pager - [phone]  After 6pm go to [email or web address] - Social research officer, governmentpassword [email or web address] [email or web address]  [employer]  [facility]  Office  [phone]  CC: Primary care physician; Tamsen Roershrismon, Dennis E, PA  Note: This dictation was prepared with Dragon dictation along with smaller phrase technology. Any transcriptional errors that result from this process are unintentional.

## 2018-05-16 NOTE — Progress Notes (Signed)
ANTICOAGULATION CONSULT NOTE - Initial Consult  Pharmacy Consult for Heparin  Indication: chest pain/ACS  No Known Allergies  Patient Measurements: Height: 5\' 7"  (170.2 cm) Weight: 180 lb (81.6 kg) IBW/kg (Calculated) : 66.1 Heparin Dosing Weight:  81.6 kg   Vital Signs: Temp: 99.1 F (37.3 C) (05/12 0830) Temp Source: Oral (05/12 0830) BP: 120/84 (05/12 0930) Pulse Rate: 122 (05/12 0930)  Labs: Recent Labs    05/15/18 1648 05/16/18 0359 05/16/18 1100  HGB 13.6 13.2  --   HCT 41.4 40.5  --   PLT 229 217  --   LABPROT  --  14.2  --   INR  --  1.1  --   HEPARINUNFRC  --  0.27* 0.32  CREATININE 0.85 0.86  --   TROPONINI <0.03  <0.03 0.16* 0.54*    Estimated Creatinine Clearance: 112.1 mL/min (by C-G formula based on SCr of 0.86 mg/dL).   Medical History: Past Medical History:  Diagnosis Date  . Hypertension     Medications:  Medications Prior to Admission  Medication Sig Dispense Refill Last Dose  . acetaminophen (TYLENOL) 500 MG tablet Take 500-1,000 mg by mouth every 6 (six) hours as needed for mild pain or fever.   Unknown at PRN  . guaiFENesin (MUCINEX) 600 MG 12 hr tablet Take 600 mg by mouth 2 (two) times daily as needed for cough or to loosen phlegm.   Unknown at PRN  . Omeprazole 20 MG TBEC Take 40 mg by mouth 2 (two) times daily.    05/14/2018 at Unknown time  . pseudoephedrine (SUDAFED) 30 MG tablet Take 30-60 mg by mouth every 6 (six) hours as needed for congestion.   Unknown at PRN  . azithromycin (ZITHROMAX) 250 MG tablet Take 2 tablets by mouth today then one daily for 4 days. (Patient not taking: Reported on 05/15/2018) 6 tablet 0 Completed Course at Unknown time  . cyclobenzaprine (FLEXERIL) 5 MG tablet Take 1 tablet (5 mg total) by mouth 3 (three) times daily as needed for muscle spasms. (Patient not taking: Reported on 05/15/2018) 30 tablet 0 Completed Course at Unknown time  . predniSONE (DELTASONE) 5 MG tablet Taper down by 1 tablet daily starting  with 6 by mouth the first day (6,5,4,3,2,1) (Patient not taking: Reported on 05/15/2018) 21 tablet 0 Completed Course at Unknown time    Assessment: Pharmacy consulted to dose heparin in this 43 year old male admitted with ACS/NSTEMI. CrCl = 113.4 ml/min  5/11 heparin infusion started @  1000 units/hr 5/12 0359 HL: 0.27 5/12 1100 HL: 0.32  Goal of Therapy:  Heparin level 0.3-0.7 units/ml Monitor platelets by anticoagulation protocol: Yes   Plan:  5/12 @ 1100 HL: 0.32. Level is therapeutic. Will continue Heparin infusion at 1150 units/hr.  Will recheck HL in 6 hours for confirmation. Continue to monitor CBCs daily while on heparin    Clovia Cuff, PharmD, BCPS 05/16/2018 11:53 AM

## 2018-05-16 NOTE — Progress Notes (Signed)
Tolerating transition to conventional nasal cannula.  No new complaints.  Denies pain.  Episode of NSVT this morning.  Vitals:   05/16/18 1100 05/16/18 1130 05/16/18 1200 05/16/18 1230  BP: (!) 125/92 (!) 129/96 (!) 147/126 (!) 135/98  Pulse: (!) 114 (!) 101 (!) 114 (!) 120  Resp:   (!) 23 (!) 35  Temp:   99 F (37.2 C)   TempSrc:   Oral   SpO2: 99% 97% 98% 99%  Weight:      Height:        Gen: WDWN in NAD HEENT: NCAT, sclerae white, oropharynx normal Neck: No LAN, no JVD noted Lungs: full BS, scattered bilateral crackles Cardiovascular: Regular, normal rate, no M noted Abdomen: Soft, NT, +BS Ext: no C/C/E Neuro: PERRL, EOMI, motor/sensory grossly intact Skin: No lesions noted   BMP Latest Ref Rng & Units 05/16/2018 05/15/2018 10/29/2017  Glucose 70 - 99 mg/dL 875(I) 433(I) 951(O)  BUN 6 - 20 mg/dL 13 16 12   Creatinine 0.61 - 1.24 mg/dL 8.41 6.60 6.30  BUN/Creat Ratio 9 - 20 - - -  Sodium 135 - 145 mmol/L 138 143 142  Potassium 3.5 - 5.1 mmol/L 3.4(L) 4.1 3.8  Chloride 98 - 111 mmol/L 102 107 104  CO2 22 - 32 mmol/L 23 22 22   Calcium 8.9 - 10.3 mg/dL 1.6(W) 9.1 9.9   CBC Latest Ref Rng & Units 05/16/2018 05/15/2018 10/29/2017  WBC 4.0 - 10.5 K/uL 8.5 7.6 6.7  Hemoglobin 13.0 - 17.0 g/dL 10.9 32.3 55.7  Hematocrit 39.0 - 52.0 % 40.5 41.4 42.9  Platelets 150 - 400 K/uL 217 229 210   Cardiac Panel (last 3 results) Recent Labs    05/15/18 1648 05/16/18 0359 05/16/18 1100  TROPONINI <0.03  <0.03 0.16* 0.54*    No new chest x-ray   IMPRESSION: Acute hypoxemic respiratory failure Pulmonary infiltrates Minimally elevated troponin I Sinus tachycardia Nonsustained VT 5/12 AM Smoker   PLAN/REC: Continue to watch and SDU through today Continue supplemental oxygen as needed Continue current antibiotics Initiate beta-blocker therapy Recheck troponin I in a.m. Echocardiogram pending   Billy Fischer, MD PCCM service Mobile [phone] Pager  [phone] 05/16/2018 1:23 PM

## 2018-05-16 NOTE — Consult Note (Signed)
[facility]  SUBJECTIVE: Mr. Joshua Mayo is a 43 year old male with a past medical history significant for hypertension, history of non-compliance with medications, and tobacco abuse who presented to the ED on 05/15/18 for a 2-3 week history of progressive exertional shortness of breath and orthopnea.  He was started on prednisone and azithromycin with improvement in symptoms for 1-2 days but symptoms returned.  Workup in the ED was significant for bilateral atypical pneumonia/edema on chest xray, BNP elevated at 519, and a low grade fever.  Initial troponin was negative, but subsequent was elevated at .16.    Today, Joshua Mayo reports overall improvement in symptoms.  He denies chest pain.  Shortness of breath has significantly improved.  Admits to occasional heart racing/fluttering sensations which are brief in duration.  Denies lower extremity swelling.  Denies dizziness, lightheadedness, or syncopal/presyncopal episodes.    Vitals:   05/16/18 1100 05/16/18 1130 05/16/18 1200 05/16/18 1230  BP: (!) 125/92 (!) 129/96 (!) 147/126 (!) 135/98  Pulse: (!) 114 (!) 101 (!) 114 (!) 120  Resp:   (!) 23 (!) 35  Temp:   99 F (37.2 C)   TempSrc:   Oral   SpO2: 99% 97% 98% 99%  Weight:      Height:         Intake/Output Summary (Last 24 hours) at 05/16/2018 1335 Last data filed at 05/16/2018 0800 Gross per 24 hour  Intake 448.34 ml  Output 2550 ml  Net -2101.66 ml      PHYSICAL EXAM  General: Well developed, well nourished, in no acute distress HEENT:  Normocephalic and atramatic Neck:  No JVD.  Lungs: Clear bilaterally to auscultation and percussion. On nasal cannula  Heart: Tachycardic, regular rhythm . Normal S1 and S2 without gallops or murmurs.  Abdomen: Bowel sounds are positive, abdomen soft and non-tender  Msk:  Back normal. Normal strength and tone for age. Extremities: No clubbing, cyanosis or edema.   Neuro: Alert and oriented X 3. Psych:  Good affect, responds  appropriately   LABS: Basic Metabolic Panel: Recent Labs    05/15/18 1648 05/16/18 0359  NA 143 138  K 4.1 3.4*  CL 107 102  CO2 22 23  GLUCOSE 104* 101*  BUN 16 13  CREATININE 0.85 0.86  CALCIUM 9.1 8.6*   Liver Function Tests: Recent Labs    05/15/18 1648 05/16/18 0359  AST 75* 109*  ALT 142* 176*  ALKPHOS 71 70  BILITOT 1.4* 1.6*  PROT 8.2* 7.4  ALBUMIN 4.5 4.2   No results for input(s): LIPASE, AMYLASE in the last 72 hours. CBC: Recent Labs    05/15/18 1648 05/16/18 0359  WBC 7.6 8.5  NEUTROABS 4.7  --   HGB 13.6 13.2  HCT 41.4 40.5  MCV 89.8 89.4  PLT 229 217   Cardiac Enzymes: Recent Labs    05/15/18 1648 05/16/18 0359 05/16/18 1100  TROPONINI <0.03  <0.03 0.16* 0.54*   BNP: Invalid input(s): POCBNP D-Dimer: No results for input(s): DDIMER in the last 72 hours. Hemoglobin A1C: Recent Labs    05/16/18 0359  HGBA1C 4.6*   Fasting Lipid Panel: Recent Labs    05/16/18 0359  CHOL 239*  HDL 100  LDLCALC 120*  TRIG 95  CHOLHDL 2.4   Thyroid Function Tests: No results for input(s): TSH, T4TOTAL, T3FREE, THYROIDAB in the last 72 hours.  Invalid input(s): FREET3 Anemia Panel: Recent Labs    05/16/18 0359  FERRITIN 601*    Dg Chest  Portable 1 View  Result Date: 05/15/2018 CLINICAL DATA:  Shortness of breath.  Chest pain. EXAM: PORTABLE CHEST 1 VIEW COMPARISON:  Patient's prior chest x-ray from 2001 could not be retrieved for comparison. FINDINGS: The cardiac silhouette is enlarged. There is no large pleural effusion. There are diffuse bilateral airspace opacities. No pneumothorax. No large pleural effusion. There is no acute osseous abnormality. IMPRESSION: 1. Diffuse bilateral hazy airspace opacities concerning for multifocal pneumonia (viral or bacterial). Pulmonary edema can have a similar appearance. 2. Cardiomegaly. Electronically Signed   By: Katherine Mantlehristopher  Green M.D.   On: 05/15/2018 17:21    Echo: Pending   TELEMETRY: Sinus  tachycardia   ASSESSMENT AND PLAN:  Active Problems:   Acute respiratory failure with hypoxia (HCC)    1.  Acute respiratory failure with hypoxia   -Continue supplemental oxygen, abx therapy   -Echocardiogram pending  2.  Elevated troponin (<0.03, .54)   -Will continue to trend; patient currently denying any chest pain or concerning symptoms  3.  Tachycardia    -Metoprolol 25mg  BID started; reported episode of NSVT earlier this morning - will continue to monitor  4.  Hypokalemia   -Continue potassium supplementation   The history, physical exam findings, and plan of care were all discussed with Dr. Harold HedgeKenneth Anayla Giannetti, and all decision making was made in collaboration.    Andi Henceicole L Stephens  PA-C 05/16/2018 1:35 PM

## 2018-05-16 NOTE — Progress Notes (Signed)
CRITICAL VALUE STICKER  CRITICAL VALUE: troponin 0.54  RECEIVER (on-site recipient of call): Vergia Alberts, RN  DATE & TIME NOTIFIED: 05/16/18 1025  MD NOTIFIED: Dr. Sung Amabile  TIME OF NOTIFICATION: 1132  RESPONSE: Acknowledged, no new orders

## 2018-05-17 DIAGNOSIS — F17200 Nicotine dependence, unspecified, uncomplicated: Secondary | ICD-10-CM

## 2018-05-17 DIAGNOSIS — J810 Acute pulmonary edema: Secondary | ICD-10-CM

## 2018-05-17 DIAGNOSIS — I420 Dilated cardiomyopathy: Secondary | ICD-10-CM

## 2018-05-17 LAB — COMPREHENSIVE METABOLIC PANEL
ALT: 107 U/L — ABNORMAL HIGH (ref 0–44)
AST: 51 U/L — ABNORMAL HIGH (ref 15–41)
Albumin: 3.6 g/dL (ref 3.5–5.0)
Alkaline Phosphatase: 58 U/L (ref 38–126)
Anion gap: 11 (ref 5–15)
BUN: 16 mg/dL (ref 6–20)
CO2: 23 mmol/L (ref 22–32)
Calcium: 8.8 mg/dL — ABNORMAL LOW (ref 8.9–10.3)
Chloride: 103 mmol/L (ref 98–111)
Creatinine, Ser: 0.82 mg/dL (ref 0.61–1.24)
GFR calc Af Amer: >60 mL/min (ref >60)
GFR calc non Af Amer: >60 mL/min (ref >60)
Glucose, Bld: 97 mg/dL (ref 70–99)
Potassium: 4.2 mmol/L (ref 3.5–5.1)
Sodium: 137 mmol/L (ref 135–145)
Total Bilirubin: 1.2 mg/dL (ref 0.3–1.2)
Total Protein: 7.1 g/dL (ref 6.5–8.1)

## 2018-05-17 LAB — C-REACTIVE PROTEIN: CRP: 3.9 mg/dL — ABNORMAL HIGH (ref <1.0)

## 2018-05-17 LAB — CBC
HCT: 39.7 % (ref 39.0–52.0)
Hemoglobin: 12.8 g/dL — ABNORMAL LOW (ref 13.0–17.0)
MCH: 29.4 pg (ref 26.0–34.0)
MCHC: 32.2 g/dL (ref 30.0–36.0)
MCV: 91.1 fL (ref 80.0–100.0)
Platelets: 181 10*3/uL (ref 150–400)
RBC: 4.36 MIL/uL (ref 4.22–5.81)
RDW: 12.6 % (ref 11.5–15.5)
WBC: 7.5 10*3/uL (ref 4.0–10.5)
nRBC: 0 % (ref 0.0–0.2)

## 2018-05-17 LAB — TROPONIN I: Troponin I: 0.68 ng/mL (ref <0.03)

## 2018-05-17 LAB — BRAIN NATRIURETIC PEPTIDE: B Natriuretic Peptide: 505 pg/mL — ABNORMAL HIGH (ref 0.0–100.0)

## 2018-05-17 LAB — FERRITIN: Ferritin: 619 ng/mL — ABNORMAL HIGH (ref 24–336)

## 2018-05-17 LAB — FIBRIN DERIVATIVES D-DIMER (ARMC ONLY): Fibrin derivatives D-dimer (ARMC): 1024.55 ng/mL (FEU) — ABNORMAL HIGH (ref 0.00–499.00)

## 2018-05-17 LAB — LACTATE DEHYDROGENASE: LDH: 166 U/L (ref 98–192)

## 2018-05-17 LAB — HIV ANTIBODY (ROUTINE TESTING W REFLEX): HIV Screen 4th Generation wRfx: NONREACTIVE

## 2018-05-17 LAB — LEGIONELLA PNEUMOPHILA SEROGP 1 UR AG: L. pneumophila Serogp 1 Ur Ag: NEGATIVE

## 2018-05-17 LAB — HEPARIN LEVEL (UNFRACTIONATED): Heparin Unfractionated: 0.27 IU/mL — ABNORMAL LOW (ref 0.30–0.70)

## 2018-05-17 MED ORDER — HEPARIN BOLUS VIA INFUSION
1000.0000 [IU] | Freq: Once | INTRAVENOUS | Status: AC
  Administered 2018-05-17: 06:00:00 1000 [IU] via INTRAVENOUS
  Filled 2018-05-17: qty 1000

## 2018-05-17 MED ORDER — ASPIRIN 81 MG PO CHEW
81.0000 mg | CHEWABLE_TABLET | Freq: Every day | ORAL | Status: DC
  Administered 2018-05-17 – 2018-05-19 (×3): 81 mg via ORAL
  Filled 2018-05-17 (×3): qty 1

## 2018-05-17 MED ORDER — LISINOPRIL 10 MG PO TABS
10.0000 mg | ORAL_TABLET | Freq: Every day | ORAL | Status: DC
  Administered 2018-05-17 – 2018-05-18 (×2): 10 mg via ORAL
  Filled 2018-05-17: qty 1
  Filled 2018-05-17: qty 2

## 2018-05-17 MED ORDER — TRAZODONE HCL 50 MG PO TABS
50.0000 mg | ORAL_TABLET | Freq: Every evening | ORAL | Status: DC | PRN
  Administered 2018-05-18 (×2): 50 mg via ORAL
  Filled 2018-05-17 (×2): qty 1

## 2018-05-17 MED ORDER — FUROSEMIDE 20 MG PO TABS
20.0000 mg | ORAL_TABLET | Freq: Every day | ORAL | Status: DC
  Administered 2018-05-17 – 2018-05-19 (×3): 20 mg via ORAL
  Filled 2018-05-17 (×3): qty 1

## 2018-05-17 MED ORDER — ENOXAPARIN SODIUM 40 MG/0.4ML ~~LOC~~ SOLN
40.0000 mg | SUBCUTANEOUS | Status: DC
  Administered 2018-05-17 – 2018-05-19 (×3): 40 mg via SUBCUTANEOUS
  Filled 2018-05-17 (×3): qty 0.4

## 2018-05-17 NOTE — Progress Notes (Signed)
Date of Admission:  05/15/2018     ID: Joshua Mayo is a 43 y.o. male Active Problems:   Acute respiratory failure with hypoxia (HCC)    Subjective: Feeling much better SOB improved a lot   Medications:  . colchicine  0.6 mg Oral BID  . metoprolol tartrate  25 mg Oral BID    Objective: Patient Vitals for the past 24 hrs:  BP Temp Temp src Pulse Resp SpO2 Height Weight  05/17/18 1257 (!) 127/98 - - 88 - 99 % 5\' 7"  (1.702 m) 79.4 kg  05/17/18 1254 (!) 124/102 97.9 F (36.6 C) Oral 92 16 100 % - -  05/17/18 1200 (!) 119/104 - - 92 (!) 21 98 % - -  05/17/18 1100 (!) 120/91 - - 96 15 95 % - -  05/17/18 1000 127/89 98 F (36.7 C) - (!) 120 16 97 % - -  05/17/18 0700 (!) 122/92 - - (!) 112 (!) 27 96 % - -  05/17/18 0400 (!) 123/94 99.1 F (37.3 C) Oral (!) 104 (!) 23 99 % - -  05/17/18 0300 (!) 118/92 - - (!) 103 20 96 % - -  05/17/18 0200 114/87 - - (!) 102 (!) 25 98 % - -  05/17/18 0100 102/84 - - (!) 101 20 97 % - -  05/17/18 0000 112/81 - - (!) 102 (!) 21 100 % - -  05/16/18 2300 (!) 138/103 - - (!) 115 (!) 23 98 % - -  05/16/18 2255 (!) 134/107 - - (!) 107 - - - -  05/16/18 2000 (!) 128/98 98.4 F (36.9 C) Oral 100 18 100 % - -  05/16/18 1700 (!) 135/105 - - (!) 141 (!) 28 (!) 66 % - -  05/16/18 1600 (!) 119/93 - - (!) 104 (!) 27 99 % - -  05/16/18 1530 (!) 122/98 - - (!) 104 (!) 23 96 % - -  05/16/18 1500 (!) 132/104 - - (!) 119 (!) 28 97 % - -  05/16/18 1430 (!) 116/91 - - (!) 107 20 100 % - -  05/16/18 1400 (!) 132/99 - - (!) 118 20 100 % - -   PHYSICAL EXAM:  General: Alert, cooperative, no distress, appears stated age.  Lungs: b/l air entry- basal crepts. Heart: Regular rate and rhythm, no murmur, rub or gallop. Abdomen: Soft,  No ankle edema' Lab Results Recent Labs    05/16/18 0359 05/17/18 0411  WBC 8.5 7.5  HGB 13.2 12.8*  HCT 40.5 39.7  NA 138 137  K 3.4* 4.2  CL 102 103  CO2 23 23  BUN 13 16  CREATININE 0.86 0.82   Liver Panel Recent  Labs    05/16/18 0359 05/17/18 0411  PROT 7.4 7.1  ALBUMIN 4.2 3.6  AST 109* 51*  ALT 176* 107*  ALKPHOS 70 58  BILITOT 1.6* 1.2   Sedimentation Rate No results for input(s): ESRSEDRATE in the last 72 hours. C-Reactive Protein Recent Labs    05/15/18 1649  CRP 1.4*    Microbiology: ECHO The left ventricle has severely reduced systolic function, with an ejection fraction of 20-25%. The cavity size was mildly dilated.  Studies/Results:   Dg Chest Port 1 View  Result Date: 05/16/2018 CLINICAL DATA:  Acute respiratory failure. EXAM: PORTABLE CHEST 1 VIEW COMPARISON:  Single-view of the chest 05/15/2018. FINDINGS: Bilateral airspace disease persists. Aeration shows some improvement bilaterally. No pneumothorax or pleural effusion. Heart size is enlarged.  IMPRESSION: Improved bilateral pneumonia.  No new abnormality. Electronically Signed   By: Drusilla Kannerhomas  Dalessio M.D.   On: 05/16/2018 22:08   Dg Chest Portable 1 View  Result Date: 05/15/2018 CLINICAL DATA:  Shortness of breath.  Chest pain. EXAM: PORTABLE CHEST 1 VIEW COMPARISON:  Patient's prior chest x-ray from 2001 could not be retrieved for comparison. FINDINGS: The cardiac silhouette is enlarged. There is no large pleural effusion. There are diffuse bilateral airspace opacities. No pneumothorax. No large pleural effusion. There is no acute osseous abnormality. IMPRESSION: 1. Diffuse bilateral hazy airspace opacities concerning for multifocal pneumonia (viral or bacterial). Pulmonary edema can have a similar appearance. 2. Cardiomegaly. Electronically Signed   By: Katherine Mantlehristopher  Green M.D.   On: 05/15/2018 17:21     Assessment/Plan: ?43 y.o. male with a history of hypertension not on any medication is admitted with 3-week history of shortness of breath and cough.  ?Acute hypoxic respiratory failure: With uncontrolled hypertension not on treatment, tachycardia chest x-ray showing bilateral infiltrates with cephalization of veins this  looks more like congestive heart failure.  He also has BNP about 900.  Marland Kitchen. Also echo shows ef 20-25% with dilated cardiomyopathy Repeat CXR after 80mg  of lasix  improved currently started on frusemide, lisinoril, metoprolol and aspirin  COVID-19 illness is unlikely as 2 tests neg, as he never had fever and his family members are not sick.  As this is not bacterial pneumonia DC ceftriaxone and azithromycin ( procal also 0.110 ?Discussed the management with the patient and pulmonologist ID will sign off-  Call if needed

## 2018-05-17 NOTE — Progress Notes (Signed)
Now comfortable on room air.  No new complaints.  Vitals:   05/17/18 1100 05/17/18 1200 05/17/18 1254 05/17/18 1257  BP: (!) 120/91 (!) 119/104 (!) 124/102 (!) 127/98  Pulse: 96 92 92 88  Resp: 15 (!) 21 16   Temp:   97.9 F (36.6 C)   TempSrc:   Oral   SpO2: 95% 98% 100% 99%  Weight:    79.4 kg  Height:    5\' 7"  (1.702 m)    Gen: NAD HEENT: NCAT, sclerae white Neck: No JVD Lungs: breath sounds full, no wheezes or other adventitious sounds Cardiovascular: RRR, no murmurs Abdomen: Soft, nontender, normal BS Ext: without clubbing, cyanosis, edema Neuro: grossly intact Skin: Limited exam, no lesions noted    BMP Latest Ref Rng & Units 05/17/2018 05/16/2018 05/15/2018  Glucose 70 - 99 mg/dL 97 948(A) 165(V)  BUN 6 - 20 mg/dL 16 13 16   Creatinine 0.61 - 1.24 mg/dL 3.74 8.27 0.78  BUN/Creat Ratio 9 - 20 - - -  Sodium 135 - 145 mmol/L 137 138 143  Potassium 3.5 - 5.1 mmol/L 4.2 3.4(L) 4.1  Chloride 98 - 111 mmol/L 103 102 107  CO2 22 - 32 mmol/L 23 23 22   Calcium 8.9 - 10.3 mg/dL 6.7(J) 4.4(B) 9.1   CBC Latest Ref Rng & Units 05/17/2018 05/16/2018 05/15/2018  WBC 4.0 - 10.5 K/uL 7.5 8.5 7.6  Hemoglobin 13.0 - 17.0 g/dL 12.8(L) 13.2 13.6  Hematocrit 39.0 - 52.0 % 39.7 40.5 41.4  Platelets 150 - 400 K/uL 181 217 229   Cardiac Panel (last 3 results) Recent Labs    05/16/18 0359 05/16/18 1100 05/17/18 0411  TROPONINI 0.16* 0.54* 0.68*    CXR: Cardiomegaly, vague L suprahilar infiltrate.  Overall improved aeration  Echocardiogram 05/12: LVEF 20-25%.  LV cavity mildly dilated.  LVH noted.  Increased diastolic filling noted.  Diffuse hypokinesis.  LA mildly dilated.  Right-sided pressures and function appear normal.   IMPRESSION: Acute hypoxemic respiratory failure, resolved Pulmonary infiltrates, resolving Minimally elevated troponin I Dilated cardiomyopathy, ischemic +/- hypertensive Smoker   PLAN/REC: Transfer to telemetry floor. Continue supplemental oxygen as  needed Continue beta-blocker therapy Aspirin, lisinopril and furosemide initiated 5/13 Again counseled regarding smoking cessation Cardiology following  After transfer, PCCM will sign off. Please call if we can be of further assistance   Billy Fischer, MD PCCM service Mobile [phone] Pager [phone] 05/17/2018 1:32 PM

## 2018-05-17 NOTE — Progress Notes (Signed)
[facility] - [place] at [facility]   PATIENT NAME: Joshua Mayo    MR#:  [id number]  DATE OF BIRTH:  10/10/1975  SUBJECTIVE:   States he is feeling much better this morning.  His shortness of breath has improved.  He denies any chest pain.  He is happy to be leaving the ICU today.  REVIEW OF SYSTEMS:  Review of Systems  Constitutional: Negative for chills, diaphoresis and fever.  HENT: Negative for congestion and sore throat.   Eyes: Negative for blurred vision and double vision.  Respiratory: Positive for shortness of breath. Negative for cough.   Cardiovascular: Negative for chest pain and palpitations.  Gastrointestinal: Negative for nausea and vomiting.  Genitourinary: Negative for dysuria and urgency.  Musculoskeletal: Negative for back pain and neck pain.  Neurological: Negative for dizziness and headaches.  Psychiatric/Behavioral: Negative for depression. The patient is not nervous/anxious.     DRUG ALLERGIES:   Allergies  Allergen Reactions  . Banana Anaphylaxis   VITALS:  Blood pressure (!) 127/98, pulse 88, temperature 97.9 F (36.6 C), temperature source Oral, resp. rate 16, height 5\' 7"  (1.702 m), weight 79.4 kg, SpO2 99 %. PHYSICAL EXAMINATION:  Physical Exam  GENERAL:  Laying in the bed with no acute distress.  HEENT: Head atraumatic, normocephalic. Pupils equal, round, reactive to light and accommodation. No scleral icterus. Extraocular muscles intact. Oropharynx and nasopharynx clear.  NECK:  Supple, no jugular venous distention. No thyroid enlargement. LUNGS: + Bibasilar crackles present. [place] in place. No use of accessory muscles of respiration.  CARDIOVASCULAR: S1, S2 normal. No murmurs, rubs, or gallops.  ABDOMEN: Soft, nontender, nondistended. Bowel sounds present.  EXTREMITIES: No pedal edema, cyanosis, or clubbing.  NEUROLOGIC: CN 2-12 intact, no focal deficits. 5/5 muscle strength throughout all extremities. Sensation intact  throughout. Gait not checked.  PSYCHIATRIC: The patient is alert and oriented x 3.  SKIN: No obvious rash, lesion, or ulcer.  LABORATORY PANEL:  Male CBC Recent Labs  Lab 05/17/18 0411  WBC 7.5  HGB 12.8*  HCT 39.7  PLT 181   ------------------------------------------------------------------------------------------------------------------ Chemistries  Recent Labs  Lab 05/17/18 0411  NA 137  K 4.2  CL 103  CO2 23  GLUCOSE 97  BUN 16  CREATININE 0.82  CALCIUM 8.8*  AST 51*  ALT 107*  ALKPHOS 58  BILITOT 1.2   RADIOLOGY:  Dg Chest Port 1 View  Result Date: 05/16/2018 CLINICAL DATA:  Acute respiratory failure. EXAM: PORTABLE CHEST 1 VIEW COMPARISON:  Single-view of the chest 05/15/2018. FINDINGS: Bilateral airspace disease persists. Aeration shows some improvement bilaterally. No pneumothorax or pleural effusion. Heart size is enlarged. IMPRESSION: Improved bilateral pneumonia.  No new abnormality. Electronically Signed   By: Drusilla Kannerhomas  Dalessio M.D.   On: 05/16/2018 22:08   ASSESSMENT AND PLAN:   Acute respiratory failure with hypoxia- due to new diagnosis of acute systolic congestive heart failure. Improving. On 3.5L O2 today.  -COVID negative x 2 -Ceftriaxone and azithromycin stopped per ID today, as this patient likely does not have bacterial pneumonia with a negative procalcitonin. -Continue IV Lasix -ECHO with EF 20-25% -Cardiology consulted  Elevated troponin- demand ischemia in the setting of acute CHF exacerbation. Troponin 0.16 > 0.54 > 0.68 -Continue to trend troponins -Heparin gtt stopped this morning -Cardiology consulted  Elevated LFTs- this could be related to heart failure -Monitor -Will check a right upper quadrant ultrasound and hepatitis panel  Tobacco use -Counseling performed this admission  All the records are reviewed  and case discussed with Care Management/Social Worker. Management plans discussed with the patient, family and they are in  agreement.  CODE STATUS: Full Code  TOTAL TIME TAKING CARE OF THIS PATIENT: 35 minutes.   More than 50% of the time was spent in counseling/coordination of care: YES  POSSIBLE D/C IN 1-2 DAYS, DEPENDING ON CLINICAL CONDITION.   Jinny Blossom Kimmy Parish M.D on 05/17/2018 at 2:40 PM  Between 7am to 6pm - Pager - [phone]  After 6pm go to [email or web address] - Social research officer, government  [facility] [place] [facility]  Office  [phone]  CC: Primary care physician; Tamsen Roers, PA  Note: This dictation was prepared with Dragon dictation along with smaller phrase technology. Any transcriptional errors that result from this process are unintentional.

## 2018-05-17 NOTE — Progress Notes (Signed)
Pt requesting something to help him sleep, MD paged, Dr. Anne Hahn put in orders for pt to have trazodone. Will give & continue to monitor. Shirley Friar, RN, BSN

## 2018-05-17 NOTE — Progress Notes (Signed)
Pt transferred to room 250 via wheelchair on monitor. All belongings with pt.

## 2018-05-17 NOTE — Progress Notes (Signed)
Ch visited w/ pt to see how well he was progressing after transitioning from ICU. Pt c/o SOB and coughing still. Pt was concerned because he has been experiencing these symptoms for over a month. Pt is negative for CV-19/PNA but shared that he is still being treated for PNA. Ch allowed pt to share his concerns and his desire to cut back on smoking. Pt shared that he mainly smokes at work but has not had cravings for a cigarette since being admitted. Pt stated he smoked 1 pack/day. 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 Pt admitted that his lifestyle and diet has contribute to his health challenges. Pt is hopeful to make changes since his dad died of similar health issues at the age of 62. Pt was appreciative of visit.    05/17/18 1400  Clinical Encounter Type  Visited With Patient  Visit Type Psychological support;Spiritual support;Social support  Spiritual Encounters  Spiritual Needs Emotional;Grief support  Stress Factors  Patient Stress Factors Major life changes;Loss of control;Health changes  Family Stress Factors Major life changes

## 2018-05-17 NOTE — Progress Notes (Signed)
ANTICOAGULATION CONSULT NOTE - Initial Consult  Pharmacy Consult for Heparin  Indication: chest pain/ACS  Allergies  Allergen Reactions  . Banana Anaphylaxis    Patient Measurements: Height: 5\' 7"  (170.2 cm) Weight: 180 lb (81.6 kg) IBW/kg (Calculated) : 66.1 Heparin Dosing Weight:  81.6 kg   Vital Signs: Temp: 99.1 F (37.3 C) (05/13 0400) Temp Source: Oral (05/13 0400) BP: 123/94 (05/13 0400) Pulse Rate: 104 (05/13 0400)  Labs: Recent Labs    05/15/18 1648  05/16/18 0359 05/16/18 1100 05/16/18 1714 05/17/18 0411  HGB 13.6  --  13.2  --   --  12.8*  HCT 41.4  --  40.5  --   --  39.7  PLT 229  --  217  --   --  181  LABPROT  --   --  14.2  --   --   --   INR  --   --  1.1  --   --   --   HEPARINUNFRC  --    < > 0.27* 0.32 0.30 0.27*  CREATININE 0.85  --  0.86  --   --  0.82  TROPONINI <0.03  <0.03  --  0.16* 0.54*  --  0.68*   < > = values in this interval not displayed.    Estimated Creatinine Clearance: 117.6 mL/min (by C-G formula based on SCr of 0.82 mg/dL).   Medical History: Past Medical History:  Diagnosis Date  . Hypertension     Medications:  Medications Prior to Admission  Medication Sig Dispense Refill Last Dose  . acetaminophen (TYLENOL) 500 MG tablet Take 500-1,000 mg by mouth every 6 (six) hours as needed for mild pain or fever.   Unknown at PRN  . guaiFENesin (MUCINEX) 600 MG 12 hr tablet Take 600 mg by mouth 2 (two) times daily as needed for cough or to loosen phlegm.   Unknown at PRN  . Omeprazole 20 MG TBEC Take 40 mg by mouth 2 (two) times daily.    05/14/2018 at Unknown time  . pseudoephedrine (SUDAFED) 30 MG tablet Take 30-60 mg by mouth every 6 (six) hours as needed for congestion.   Unknown at PRN  . azithromycin (ZITHROMAX) 250 MG tablet Take 2 tablets by mouth today then one daily for 4 days. (Patient not taking: Reported on 05/15/2018) 6 tablet 0 Completed Course at Unknown time  . cyclobenzaprine (FLEXERIL) 5 MG tablet Take 1 tablet  (5 mg total) by mouth 3 (three) times daily as needed for muscle spasms. (Patient not taking: Reported on 05/15/2018) 30 tablet 0 Completed Course at Unknown time  . predniSONE (DELTASONE) 5 MG tablet Taper down by 1 tablet daily starting with 6 by mouth the first day (6,5,4,3,2,1) (Patient not taking: Reported on 05/15/2018) 21 tablet 0 Completed Course at Unknown time    Assessment: Pharmacy consulted to dose heparin in this 43 year old male admitted with ACS/NSTEMI. CrCl = 113.4 ml/min  5/11 heparin infusion started @  1000 units/hr 5/12 0359 HL: 0.27 5/12 1100 HL: 0.32 5/12  1714 HL 0.30  Goal of Therapy:  Heparin level 0.3-0.7 units/ml Monitor platelets by anticoagulation protocol: Yes   Plan:  5/13 @ 0411 HL: 0.27. Level is subtherapeutic. Will order 1000 unit bolus and increase heparin infusion to 1300 units/hr.  Recheck HL in 6 hours.  Continue to monitor CBCs daily while on heparin     Gardner Candle, PharmD, BCPS Clinical Pharmacist 05/17/2018 5:44 AM

## 2018-05-18 ENCOUNTER — Inpatient Hospital Stay: Payer: BLUE CROSS/BLUE SHIELD

## 2018-05-18 ENCOUNTER — Encounter: Payer: Self-pay | Admitting: *Deleted

## 2018-05-18 LAB — URINALYSIS, COMPLETE (UACMP) WITH MICROSCOPIC
Bacteria, UA: NONE SEEN
Bilirubin Urine: NEGATIVE
Glucose, UA: NEGATIVE mg/dL
Hgb urine dipstick: NEGATIVE
Ketones, ur: NEGATIVE mg/dL
Leukocytes,Ua: NEGATIVE
Nitrite: NEGATIVE
Protein, ur: NEGATIVE mg/dL
Specific Gravity, Urine: 1.02 (ref 1.005–1.030)
pH: 6 (ref 5.0–8.0)

## 2018-05-18 LAB — CBC
HCT: 37.4 % — ABNORMAL LOW (ref 39.0–52.0)
Hemoglobin: 12.2 g/dL — ABNORMAL LOW (ref 13.0–17.0)
MCH: 29.3 pg (ref 26.0–34.0)
MCHC: 32.6 g/dL (ref 30.0–36.0)
MCV: 89.7 fL (ref 80.0–100.0)
Platelets: 201 10*3/uL (ref 150–400)
RBC: 4.17 MIL/uL — ABNORMAL LOW (ref 4.22–5.81)
RDW: 12.3 % (ref 11.5–15.5)
WBC: 8.1 10*3/uL (ref 4.0–10.5)
nRBC: 0 % (ref 0.0–0.2)

## 2018-05-18 LAB — COMPREHENSIVE METABOLIC PANEL
ALT: 75 U/L — ABNORMAL HIGH (ref 0–44)
AST: 31 U/L (ref 15–41)
Albumin: 3.5 g/dL (ref 3.5–5.0)
Alkaline Phosphatase: 52 U/L (ref 38–126)
Anion gap: 9 (ref 5–15)
BUN: 18 mg/dL (ref 6–20)
CO2: 22 mmol/L (ref 22–32)
Calcium: 8.8 mg/dL — ABNORMAL LOW (ref 8.9–10.3)
Chloride: 103 mmol/L (ref 98–111)
Creatinine, Ser: 0.9 mg/dL (ref 0.61–1.24)
GFR calc Af Amer: >60 mL/min (ref >60)
GFR calc non Af Amer: >60 mL/min (ref >60)
Glucose, Bld: 99 mg/dL (ref 70–99)
Potassium: 3.5 mmol/L (ref 3.5–5.1)
Sodium: 134 mmol/L — ABNORMAL LOW (ref 135–145)
Total Bilirubin: 1.4 mg/dL — ABNORMAL HIGH (ref 0.3–1.2)
Total Protein: 6.7 g/dL (ref 6.5–8.1)

## 2018-05-18 LAB — CULTURE, RESPIRATORY W GRAM STAIN
Culture: NORMAL
Gram Stain: NONE SEEN

## 2018-05-18 MED ORDER — SODIUM CHLORIDE 0.9% FLUSH
3.0000 mL | Freq: Two times a day (BID) | INTRAVENOUS | Status: DC
  Administered 2018-05-18 – 2018-05-19 (×2): 3 mL via INTRAVENOUS

## 2018-05-18 MED ORDER — ACETAMINOPHEN 325 MG PO TABS
650.0000 mg | ORAL_TABLET | Freq: Four times a day (QID) | ORAL | Status: DC | PRN
  Administered 2018-05-18: 650 mg via ORAL
  Filled 2018-05-18: qty 2

## 2018-05-18 MED ORDER — GUAIFENESIN-CODEINE 100-10 MG/5ML PO SOLN
5.0000 mL | ORAL | Status: DC | PRN
  Administered 2018-05-19 (×2): 5 mL via ORAL
  Filled 2018-05-18 (×2): qty 5

## 2018-05-18 NOTE — Progress Notes (Signed)
Back from Ultrasound, diet ordered for pt

## 2018-05-18 NOTE — Progress Notes (Signed)
[facility] - [place] at [facility]   PATIENT NAME: Clemente Handley    MR#:  [id number]  DATE OF BIRTH:  [birth year]  SUBJECTIVE:  CHIEF COMPLAINT:   Chief Complaint  Patient presents with  . Shortness of Breath  . Generalized Body Aches  . Diarrhea   -Complains of chest pain, dyspnea occasionally. -Had a low-grade fever today.  REVIEW OF SYSTEMS:  Review of Systems  Constitutional: Negative for chills, fever and malaise/fatigue.  HENT: Negative for congestion, ear discharge, hearing loss and nosebleeds.   Eyes: Negative for blurred vision and double vision.  Respiratory: Positive for cough and shortness of breath. Negative for wheezing.   Cardiovascular: Negative for chest pain, palpitations and leg swelling.  Gastrointestinal: Negative for abdominal pain, constipation, diarrhea, nausea and vomiting.  Genitourinary: Negative for dysuria.  Musculoskeletal: Negative for myalgias.  Neurological: Negative for dizziness, focal weakness, seizures, weakness and headaches.  Psychiatric/Behavioral: Negative for depression.    DRUG ALLERGIES:   Allergies  Allergen Reactions  . Banana Anaphylaxis    VITALS:  Blood pressure (!) 140/105, pulse (!) 101, temperature 100.2 F (37.9 C), temperature source Oral, resp. rate 20, height 5\' 7"  (1.702 m), weight 80.2 kg, SpO2 98 %.  PHYSICAL EXAMINATION:  Physical Exam   GENERAL:  43 y.o.-year-old patient lying in the bed with no acute distress.  EYES: Pupils equal, round, reactive to light and accommodation. No scleral icterus. Extraocular muscles intact.  HEENT: Head atraumatic, normocephalic. Oropharynx and nasopharynx clear.  NECK:  Supple, no jugular venous distention. No thyroid enlargement, no tenderness.  LUNGS: Normal breath sounds bilaterally, no wheezing, rales,rhonchi or crepitation. No use of accessory muscles of respiration.  CARDIOVASCULAR: S1, S2 normal. No murmurs, rubs, or gallops.  ABDOMEN: Soft,  nontender, nondistended. Bowel sounds present. No organomegaly or mass.  EXTREMITIES: No pedal edema, cyanosis, or clubbing.  NEUROLOGIC: Cranial nerves II through XII are intact. Muscle strength 5/5 in all extremities. Sensation intact. Gait not checked.  PSYCHIATRIC: The patient is alert and oriented x 3.  SKIN: No obvious rash, lesion, or ulcer.    LABORATORY PANEL:   CBC Recent Labs  Lab 05/18/18 0535  WBC 8.1  HGB 12.2*  HCT 37.4*  PLT 201   ------------------------------------------------------------------------------------------------------------------  Chemistries  Recent Labs  Lab 05/18/18 0535  NA 134*  K 3.5  CL 103  CO2 22  GLUCOSE 99  BUN 18  CREATININE 0.90  CALCIUM 8.8*  AST 31  ALT 75*  ALKPHOS 52  BILITOT 1.4*   ------------------------------------------------------------------------------------------------------------------  Cardiac Enzymes Recent Labs  Lab 05/17/18 0411  TROPONINI 0.68*   ------------------------------------------------------------------------------------------------------------------  RADIOLOGY:  Dg Chest Port 1 View  Result Date: 05/16/2018 CLINICAL DATA:  Acute respiratory failure. EXAM: PORTABLE CHEST 1 VIEW COMPARISON:  Single-view of the chest 05/15/2018. FINDINGS: Bilateral airspace disease persists. Aeration shows some improvement bilaterally. No pneumothorax or pleural effusion. Heart size is enlarged. IMPRESSION: Improved bilateral pneumonia.  No new abnormality. Electronically Signed   By: Drusilla Kanner M.D.   On: 05/16/2018 22:08   US Abdomen Limited Ruq  Result Date: 05/18/2018 CLINICAL DATA:  Elevated liver enzymes EXAM: ULTRASOUND ABDOMEN LIMITED RIGHT UPPER QUADRANT COMPARISON:  CT abdomen and pelvis October 29, 2017 FINDINGS: Gallbladder: No gallstones or wall thickening visualized. There is no pericholecystic fluid. No sonographic Murphy sign noted by sonographer. Common bile duct: Diameter: 3 mm. No  intrahepatic or extrahepatic biliary duct dilatation. Liver: Liver echogenicity is increased diffusely. There is a relatively hypoechoic area near  the gallbladder fossa measuring 1.3 x 0.8 x 1.6 cm which may represent localized fatty sparing. No other evidence of potential focal liver lesion. Portal vein is patent on color Doppler imaging with normal direction of blood flow towards the liver. IMPRESSION: Diffuse increase in liver echogenicity, a finding felt to be indicative of hepatic steatosis. Note there was extensive hepatic steatosis on prior abdominal CT. Probable fatty sparing near the gallbladder fossa as noted. Liver otherwise appears essentially homogeneous in echogenicity, albeit increased. Study otherwise unremarkable. Electronically Signed   By: William  Woodruff III M.D.   On: 05/18/2018 09:44    EKG:   Orders plaBretta Bangced or performed during the hospital encounter of 05/15/18  . EKG 12-Lead  . EKG 12-Lead  . EKG 12-Lead  . EKG 12-Lead    ASSESSMENT AND PLAN:   43 year old male with smoking, alcohol abuse admitted with acute respiratory failure.  1.  Acute hypoxic respiratory failure-secondary to acute systolic heart failure. -Was in the ICU requiring BiPAP, currently on room air.  COVID negative x2 -Antibiotics have been discontinued.  BNP is elevated. -Echocardiogram with new onset heart failure EF of 20 to 25% -Appreciate cardiology consult.  Outpatient cardiac cath recommended at this point. -Patient's father died from cardiac condition in his 3840s. -Follow-up chest x-ray today -Started on Lasix, lisinopril and metoprolol  2.  Elevated troponin-type II MI, demand ischemia in the setting of heart failure -Troponin follow-up.  Heparin drip has been discontinued  3.  Elevated LFTs-alcoholic liver disease, ultrasound showing fatty liver disease.  4.  Tobacco use-counseled on admission.  5.  DVT prophylaxis-Lovenox   Anticipate discharge tomorrow if stable  All the records  are reviewed and case discussed with Care Management/Social Workerr. Management plans discussed with the patient, family and they are in agreement.  CODE STATUS: Full Code  TOTAL TIME TAKING CARE OF THIS PATIENT: 38 minutes.   POSSIBLE D/C IN 2 DAYS, DEPENDING ON CLINICAL CONDITION.   Enid Baasadhika Doneisha Ivey M.D on 05/18/2018 at 1:19 PM  Between 7am to 6pm - Pager - [phone]  After 6pm go to [email or web address] - Social research officer, governmentpassword EPAS [facility]  [facility] [place] [facility]  Office  [phone]  CC: Primary care physician; Chrismon, Jodell Ciproennis E, PA

## 2018-05-18 NOTE — Plan of Care (Signed)
  Problem: Health Behavior/Discharge Planning: Goal: Ability to manage health-related needs will improve Outcome: Progressing   Problem: Clinical Measurements: Goal: Ability to maintain clinical measurements within normal limits will improve Outcome: Progressing Goal: Diagnostic test results will improve Outcome: Progressing Goal: Respiratory complications will improve Outcome: Progressing Note:  Using Oxygen prn Goal: Cardiovascular complication will be avoided Outcome: Progressing Note:  No arrhythmias overnight   Problem: Clinical Measurements: Goal: Will remain free from infection Outcome: Not Progressing Note:  Low grade temp this am 100.2

## 2018-05-18 NOTE — Plan of Care (Signed)
  Problem: Clinical Measurements: Goal: Respiratory complications will improve Outcome: Progressing Note:  On room air   Problem: Activity: Goal: Risk for activity intolerance will decrease Outcome: Progressing Note:  Up independently in room, tolerating well   Problem: Nutrition: Goal: Adequate nutrition will be maintained Outcome: Progressing   Problem: Coping: Goal: Level of anxiety will decrease Outcome: Progressing   Problem: Elimination: Goal: Will not experience complications related to bowel motility Outcome: Progressing Goal: Will not experience complications related to urinary retention Outcome: Progressing   Problem: Pain Managment: Goal: General experience of comfort will improve Outcome: Progressing Note:  No complaints of pain this shift   Problem: Safety: Goal: Ability to remain free from injury will improve Outcome: Progressing   Problem: Skin Integrity: Goal: Risk for impaired skin integrity will decrease Outcome: Progressing   Problem: Education: Goal: Knowledge of General Education information will improve Description Including pain rating scale, medication(s)/side effects and non-pharmacologic comfort measures Outcome: Completed/Met

## 2018-05-18 NOTE — Progress Notes (Signed)
To Ultrasound via bed 

## 2018-05-19 LAB — BASIC METABOLIC PANEL
Anion gap: 9 (ref 5–15)
BUN: 15 mg/dL (ref 6–20)
CO2: 23 mmol/L (ref 22–32)
Calcium: 8.8 mg/dL — ABNORMAL LOW (ref 8.9–10.3)
Chloride: 102 mmol/L (ref 98–111)
Creatinine, Ser: 0.87 mg/dL (ref 0.61–1.24)
GFR calc Af Amer: >60 mL/min (ref >60)
GFR calc non Af Amer: >60 mL/min (ref >60)
Glucose, Bld: 95 mg/dL (ref 70–99)
Potassium: 3.8 mmol/L (ref 3.5–5.1)
Sodium: 134 mmol/L — ABNORMAL LOW (ref 135–145)

## 2018-05-19 LAB — HEPATITIS PANEL, ACUTE
HCV Ab: <0.1 s/co ratio (ref 0.0–0.9)
Hep A IgM: NEGATIVE
Hep B C IgM: NEGATIVE
Hepatitis B Surface Ag: NEGATIVE

## 2018-05-19 MED ORDER — FUROSEMIDE 20 MG PO TABS: 20.0000 mg | ORAL_TABLET | Freq: Every day | ORAL | 1 refills | Status: DC

## 2018-05-19 MED ORDER — LOSARTAN POTASSIUM 50 MG PO TABS
50.0000 mg | ORAL_TABLET | Freq: Every day | ORAL | Status: DC
  Administered 2018-05-19: 50 mg via ORAL
  Filled 2018-05-19: qty 1

## 2018-05-19 MED ORDER — ASPIRIN 81 MG PO CHEW: 81.0000 mg | CHEWABLE_TABLET | Freq: Every day | ORAL | 2 refills | Status: DC

## 2018-05-19 MED ORDER — SPIRONOLACTONE 25 MG PO TABS: 25.0000 mg | ORAL_TABLET | Freq: Every day | ORAL | 2 refills | Status: DC

## 2018-05-19 MED ORDER — CARVEDILOL 12.5 MG PO TABS: 12.5000 mg | ORAL_TABLET | Freq: Two times a day (BID) | ORAL | 2 refills | Status: DC

## 2018-05-19 MED ORDER — SPIRONOLACTONE 25 MG PO TABS
25.0000 mg | ORAL_TABLET | Freq: Every day | ORAL | Status: DC
  Administered 2018-05-19: 25 mg via ORAL
  Filled 2018-05-19: qty 1

## 2018-05-19 MED ORDER — CARVEDILOL 12.5 MG PO TABS
12.5000 mg | ORAL_TABLET | Freq: Two times a day (BID) | ORAL | Status: DC
  Administered 2018-05-19: 12.5 mg via ORAL
  Filled 2018-05-19: qty 1

## 2018-05-19 MED ORDER — LOSARTAN POTASSIUM 50 MG PO TABS: 50.0000 mg | ORAL_TABLET | Freq: Every day | ORAL | 2 refills | Status: DC

## 2018-05-19 MED ORDER — AZITHROMYCIN 500 MG PO TABS
500.0000 mg | ORAL_TABLET | Freq: Every day | ORAL | Status: DC
  Administered 2018-05-19: 500 mg via ORAL
  Filled 2018-05-19: qty 1
  Filled 2018-05-19: qty 2

## 2018-05-19 MED ORDER — GUAIFENESIN-CODEINE 100-10 MG/5ML PO SOLN: 5.0000 mL | Freq: Four times a day (QID) | ORAL | 0 refills | Status: DC | PRN

## 2018-05-19 MED ORDER — AZITHROMYCIN 500 MG PO TABS: 500.0000 mg | ORAL_TABLET | Freq: Every day | ORAL | 0 refills | Status: AC

## 2018-05-19 NOTE — Plan of Care (Signed)
?  Problem: Clinical Measurements: ?Goal: Will remain free from infection ?Outcome: Progressing ?Goal: Diagnostic test results will improve ?Outcome: Progressing ?Goal: Respiratory complications will improve ?Outcome: Progressing ?  ?

## 2018-05-19 NOTE — Progress Notes (Signed)
[facility]  SUBJECTIVE: Symptoms much improved. Denies chest pain.  Shortness of breath has improved, on room air. Denies heart racing/fluttering.  Denies syncopal/presyncopal episodes.  Discussed results of echocardiogram.  Joshua Mayo admits to excessive alcohol consumption, averaging 3-4 beers as well as 7-8 shots on a daily basis.    Vitals:   05/18/18 2012 05/18/18 2125 05/19/18 0420 05/19/18 0720  BP: (!) 137/99  (!) 133/99 (!) 134/104  Pulse: (!) 116 (!) 110 (!) 112 (!) 115  Resp: 18  16   Temp: 99.4 F (37.4 C)  98.1 F (36.7 C) 98.5 F (36.9 C)  TempSrc: Oral  Oral Oral  SpO2: 97%  95% 100%  Weight:   80.2 kg   Height:         Intake/Output Summary (Last 24 hours) at 05/19/2018 5400 Last data filed at 05/19/2018 0424 Gross per 24 hour  Intake 1260 ml  Output 1350 ml  Net -90 ml      PHYSICAL EXAM  General: Well developed, well nourished, in no acute distress HEENT:  Normocephalic and atramatic Neck:  No JVD.  Lungs: Clear bilaterally to auscultation and percussion. Heart: Tachycardic, regular rhythm. . Normal S1 and S2 without gallops or murmurs.  Abdomen: Bowel sounds are positive, abdomen soft and non-tender  Msk:  Back normal. Normal strength and tone for age. Extremities: No clubbing, cyanosis or edema.   Neuro: Alert and oriented X 3. Psych:  Good affect, responds appropriately   LABS: Basic Metabolic Panel: Recent Labs    05/18/18 0535 05/19/18 0647  NA 134* 134*  K 3.5 3.8  CL 103 102  CO2 22 23  GLUCOSE 99 95  BUN 18 15  CREATININE 0.90 0.87  CALCIUM 8.8* 8.8*   Liver Function Tests: Recent Labs    05/17/18 0411 05/18/18 0535  AST 51* 31  ALT 107* 75*  ALKPHOS 58 52  BILITOT 1.2 1.4*  PROT 7.1 6.7  ALBUMIN 3.6 3.5   No results for input(s): LIPASE, AMYLASE in the last 72 hours. CBC: Recent Labs    05/17/18 0411 05/18/18 0535  WBC 7.5 8.1  HGB 12.8* 12.2*  HCT 39.7 37.4*  MCV 91.1 89.7  PLT 181 201   Cardiac  Enzymes: Recent Labs    05/16/18 1100 05/17/18 0411  TROPONINI 0.54* 0.68*   BNP: Invalid input(s): POCBNP D-Dimer: No results for input(s): DDIMER in the last 72 hours. Hemoglobin A1C: No results for input(s): HGBA1C in the last 72 hours. Fasting Lipid Panel: No results for input(s): CHOL, HDL, LDLCALC, TRIG, CHOLHDL, LDLDIRECT in the last 72 hours. Thyroid Function Tests: No results for input(s): TSH, T4TOTAL, T3FREE, THYROIDAB in the last 72 hours.  Invalid input(s): FREET3 Anemia Panel: Recent Labs    05/17/18 0411  FERRITIN 619*    Dg Chest 2 View  Result Date: 05/18/2018 CLINICAL DATA:  Cough, congestion, and shortness of breath for the past month. EXAM: CHEST - 2 VIEW COMPARISON:  Chest x-ray dated May 16, 2018. FINDINGS: Unchanged mild cardiomegaly. Normal mediastinal contours. Interstitial and hazy airspace opacities in both upper lobes and the right lower lobe are similar to prior study. No pleural effusion or pneumothorax. No acute osseous abnormality. IMPRESSION: Similar appearing bilateral airspace disease which could reflect edema or infection. Electronically Signed   By: Obie Dredge M.D.   On: 05/18/2018 15:38   US Abdomen Limited Ruq  Result Date: 05/18/2018 CLINICAL DATA:  Elevated liver enzymes EXAM: ULTRASOUND ABDOMEN LIMITED RIGHT UPPER QUADRANT COMPARISON:  CT abdomen and pelvis October 29, 2017 FINDINGS: Gallbladder: No gallstones or wall thickening visualized. There is no pericholecystic fluid. No sonographic Murphy sign noted by sonographer. Common bile duct: Diameter: 3 mm. No intrahepatic or extrahepatic biliary duct dilatation. Liver: Liver echogenicity is increased diffusely. There is a relatively hypoechoic area near the gallbladder fossa measuring 1.3 x 0.8 x 1.6 cm which may represent localized fatty sparing. No other evidence of potential focal liver lesion. Portal vein is patent on color Doppler imaging with normal direction of blood flow towards  the liver. IMPRESSION: Diffuse increase in liver echogenicity, a finding felt to be indicative of hepatic steatosis. Note there was extensive hepatic steatosis on prior abdominal CT. Probable fatty sparing near the gallbladder fossa as noted. Liver otherwise appears essentially homogeneous in echogenicity, albeit increased. Study otherwise unremarkable. Electronically Signed   By: Bretta BangWilliam  Woodruff III M.D.   On: 05/18/2018 09:44     Echo:  Severely reduced systolic function with an EF estimated between 20-25%.  Mild LVH. Left ventricular diffuse hypokinesis.   TELEMETRY: Sinus tachycardia   ASSESSMENT AND PLAN:  Active Problems:   Acute respiratory failure with hypoxia (HCC)    1.  Cardiomyopathy, likely alcohol induced   -Stressed the importance of decreasing alcohol intake  -Will discharge on carvedilol 12.5mg  twice daily and losartan 50mg  daily; added spironolactone 25mg  daily   -[facility] week of discharge  -Further cardiac workup will be done in an outpatient setting  2.  Elevated troponin   -Will consider ischemic workup in an outpatient setting; no further workup indicated in patient  3.  Alcohol abuse  -Had long discussion about the importance of alcohol cessation   The history, physical exam findings, and plan of care were all discussed with Dr. Harold HedgeKenneth Fath, and all decision making was made in collaboration.   Joshua Henceicole L   PA-C 05/19/2018 8:28 AM

## 2018-05-19 NOTE — Progress Notes (Addendum)
Cardiovascular and Pulmonary Nurse Navigator Note:    43 year old male with smoking, alcohol abuse admitted with acute respiratory failure secondary to acute systolic HF, elevated liver enzymes, and elevated troponin due to demand ischemia.    Rounded on patient. Patient sitting up in recliner chair watching HF videos.  Patient is for discharge today.    CHF Education:?? Educational session with patient completed.  Provided patient with "Living Better with Heart Failure" packet. Briefly reviewed definition of heart failure and signs and symptoms of an exacerbation.  Potential causes of HF discussed.  Explained to patient that HF is a chronic illness which requires self-assessment / self-management along with help from the cardiologist/PCP.?Discussed EF value, his EF and normal EF.  Patient's EF was 20 - 25%. Patent to have ischemic workup as an outpatient.    Patient's symptoms of HF were:  SOB and abdominal bloating / swelling.   ? *Reviewed importance of and reason behind checking weight daily in the AM, after using the bathroom, but before getting dressed. Patient has functioning scale and plans to start weighing himself daily.   ? *Reviewed with patient the following information: *Discussed when to call the Dr= weight gain of >2-3lb overnight of 5lb in a week,  *Discussed yellow zone= call MD: weight gain of >2-3lb overnight of 5lb in a week, increased swelling, increased SOB when lying down, chest discomfort, dizziness, increased fatigue *Red Zone= call 911: struggle to breath, fainting or near fainting, significant chest pain   *Heart Failure Zone magnet given and reviewed with patient.    *Diet - Reviewed low sodium diet-provided handout of recommended and not recommended foods.  Dietitian Consultation for diet education has been ordered and is still pending.  Handouts on Sodium Content of Foods as well as Heart Failure Nutrition Therapy given to patient.   ? *Discussed fluid intake  with patient as well. Patient not currently on a fluid restriction, but advised no more than 64 ounces  of fluid per day.? ? *Instructed patient to take medications as prescribed for heart failure. Explained briefly why pt is on the medications (either make you feel better, live longer or keep you out of the hospital) and discussed monitoring and side effects.  ? *Discussed exercise. Patient does not currently exercise.  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  Shared with patient the importance of exercise.  Explained patient is a candidate for Cardiac Rehab given his low EF and HF diagnosis.  Patient should have ischemic workup prior to starting a Cardiac Rehab program.  Discussed with patient  ? *Smoking Cessation- Patient is a current every day smoker. Patient reported smoking one pack per day on his job as a Community education officer.  Patient reports that he doe not smoke in his car, at his home; but mainly just at work.  "Thinking about Quitting - Yes, You Can" handout given and reviewed with patient.  Information on free Telephonic Quit Smart classes through [facility] given to patient as well.  Patient is reporting he has done fine here in the hospital for 5 days without smoking but realizes it may be different when he returns to his normal environment.  Of note:  Patient informed this RN he had been fired today from a job where he has worked for 22 years.  Patient will still have health insurance coverage, as he is on his wife's plan.    *Alcohol abuse - Patient was open about his ETOH abuse and talked  about the MDs advising him to quit drinking.  Patient drinks alcohol usually when he arrives home after work from Monday - Saturday 7 p.m. to 9 p.m.  Patient asking this RN how much should he cut back or actually how much he can drink?  Advised patient to discuss this with his MD.  Patient has been hospitalized for 5 days and is reporting no withdrawal symptoms / tremors whatsoever.    ? *[facility] - Explained the purpose of the HF Clinic. ?Explained to patient the HF Clinic does not replace PCP nor Cardiologist, but is an additional resource to helping patient manage heart failure at home. Explained the rationale for being seen in this clinic. Virtual appt scheduled for May 21st at 8:40 p.m.   Again, the 5 Steps to Living Better with Heart Failure were reviewed with patient.   Patient was very receptive and engaged in education.  Teachable and motivated to change and take better care of his health.  ? Patient thanked me for providing the above information. ? ? Army Meliaiane Zeth Buday, RN, BSN, [facility]? [facility]  [facility] Cardiac &?Pulmonary Rehab  Cardiovascular &?Pulmonary Nurse Navigator  Direct Line: [phone]  Department Phone #: [phone] Fax: [phone]? Email Address: [email or web address]

## 2018-05-20 LAB — CULTURE, BLOOD (ROUTINE X 2)
Culture: NO GROWTH
Culture: NO GROWTH
Special Requests: ADEQUATE

## 2018-05-20 NOTE — Discharge Summary (Signed)
[facility] [place] [facility]   PATIENT NAME: Joshua Mayo    MR#:  [id number]  DATE OF BIRTH:  October 26, 1975  DATE OF ADMISSION:  05/15/2018   ADMITTING PHYSICIAN: Altamese Dilling, MD  DATE OF DISCHARGE: 05/19/2018  3:34 PM  PRIMARY CARE PHYSICIAN: Chrismon, Jodell Cipro, PA   ADMISSION DIAGNOSIS:   Acute respiratory failure with hypoxia (HCC) [J96.01] Sepsis with acute hypoxic respiratory failure without septic shock, due to unspecified organism (HCC) [A41.9, R65.20, J96.01]  DISCHARGE DIAGNOSIS:   Active Problems:   Acute respiratory failure with hypoxia (HCC)   SECONDARY DIAGNOSIS:   Past Medical History:  Diagnosis Date  . Hypertension     HOSPITAL COURSE:   43 year old male with smoking, alcohol abuse admitted with acute respiratory failure.  1.  Acute hypoxic respiratory failure-secondary to acute systolic heart failure. -Was in the ICU requiring BiPAP on admission, currently on room air.  COVID negative x2 -Was initially on broad-spectrum antibiotics which have been discontinued as procalcitonin was negative. -However given his dry cough, he was started on azithromycin prior to discharge.  He was asked to self isolate at home for atleast 7 days after his symptoms resolved. - BNP is elevated. - Echocardiogram with new onset heart failure EF of 20 to 25% - Appreciate cardiology consult.  Outpatient cardiac cath recommended at this point. - Patient's father died from cardiac condition in his 15s. -  on Lasix, losartan and coreg for his CHF  2.  Elevated troponin-type II MI, demand ischemia in the setting of heart failure -Troponin follow-up.  Heparin drip has been discontinued  3.  Elevated LFTs-alcoholic liver disease, ultrasound showing fatty liver disease.  4.  Tobacco use-counseled on admission.  Up and ambulatory- stable for discharge.   DISCHARGE CONDITIONS:   Guarded  CONSULTS OBTAINED:   Treatment Team:  Dalia Heading, MD  DRUG ALLERGIES:   Allergies  Allergen Reactions  . Banana Anaphylaxis   DISCHARGE MEDICATIONS:   Allergies as of 05/19/2018      Reactions   Banana Anaphylaxis      Medication List    STOP taking these medications   cyclobenzaprine 5 MG tablet Commonly known as:  FLEXERIL   guaiFENesin 600 MG 12 hr tablet Commonly known as:  MUCINEX   predniSONE 5 MG tablet Commonly known as:  DELTASONE   pseudoephedrine 30 MG tablet Commonly known as:  SUDAFED     TAKE these medications   acetaminophen 500 MG tablet Commonly known as:  TYLENOL Take 500-1,000 mg by mouth every 6 (six) hours as needed for mild pain or fever.   aspirin 81 MG chewable tablet Chew 1 tablet (81 mg total) by mouth daily.   azithromycin 500 MG tablet Commonly known as:  ZITHROMAX Take 1 tablet (500 mg total) by mouth daily for 4 days. What changed:    medication strength  how much to take  how to take this  when to take this  additional instructions   carvedilol 12.5 MG tablet Commonly known as:  COREG Take 1 tablet (12.5 mg total) by mouth 2 (two) times daily with a meal.   furosemide 20 MG tablet Commonly known as:  LASIX Take 1 tablet (20 mg total) by mouth daily.   guaiFENesin-codeine 100-10 MG/5ML syrup Take 5 mLs by mouth every 6 (six) hours as needed for cough.   losartan 50 MG tablet Commonly known as:  COZAAR Take 1 tablet (50 mg total) by mouth daily.  Omeprazole 20 MG Tbec Take 40 mg by mouth 2 (two) times daily.   spironolactone 25 MG tablet Commonly known as:  ALDACTONE Take 1 tablet (25 mg total) by mouth daily.        DISCHARGE INSTRUCTIONS:   1. PCP f/u in 1 week 2. Cardiology f/u in 2 weeks  DIET:   Cardiac diet  ACTIVITY:   Activity as tolerated  OXYGEN:   Home Oxygen: No.  Oxygen Delivery: room air  DISCHARGE LOCATION:   home   If you experience worsening of your admission symptoms, develop shortness of breath, life  threatening emergency, suicidal or homicidal thoughts you must seek medical attention immediately by calling 911 or calling your MD immediately  if symptoms less severe.  You Must read complete instructions/literature along with all the possible adverse reactions/side effects for all the Medicines you take and that have been prescribed to you. Take any new Medicines after you have completely understood and accpet all the possible adverse reactions/side effects.   Please note  You were cared for by a hospitalist during your hospital stay. If you have any questions about your discharge medications or the care you received while you were in the hospital after you are discharged, you can call the unit and asked to speak with the hospitalist on call if the hospitalist that took care of you is not available. Once you are discharged, your primary care physician will handle any further medical issues. Please note that NO REFILLS for any discharge medications will be authorized once you are discharged, as it is imperative that you return to your primary care physician (or establish a relationship with a primary care physician if you do not have one) for your aftercare needs so that they can reassess your need for medications and monitor your lab values.    On the day of Discharge:  VITAL SIGNS:   Blood pressure (!) 134/104, pulse (!) 115, temperature 98.5 F (36.9 C), temperature source Oral, resp. rate 16, height  (1.702 m), weight 80.2 kg, SpO2 100 %.  PHYSICAL EXAMINATION:    GENERAL:  43 y.o.-year-old patient lying in the bed with no acute distress.  EYES: Pupils equal, round, reactive to light and accommodation. No scleral icterus. Extraocular muscles intact.  HEENT: Head atraumatic, normocephalic. Oropharynx and nasopharynx clear.  NECK:  Supple, no jugular venous distention. No thyroid enlargement, no tenderness.  LUNGS: Normal breath sounds bilaterally, no wheezing, rales,rhonchi or  crepitation. No use of accessory muscles of respiration.  CARDIOVASCULAR: S1, S2 normal. No murmurs, rubs, or gallops.  ABDOMEN: Soft, nontender, nondistended. Bowel sounds present. No organomegaly or mass.  EXTREMITIES: No pedal edema, cyanosis, or clubbing.  NEUROLOGIC: Cranial nerves II through XII are intact. Muscle strength 5/5 in all extremities. Sensation intact. Gait not checked.  PSYCHIATRIC: The patient is alert and oriented x 3.  SKIN: No obvious rash, lesion, or ulcer.   DATA REVIEW:   CBC Recent Labs  Lab 05/18/18 0535  WBC 8.1  HGB 12.2*  HCT 37.4*  PLT 201    Chemistries  Recent Labs  Lab 05/18/18 0535 05/19/18 0647  NA 134* 134*  K 3.5 3.8  CL 103 102  CO2 22 23  GLUCOSE 99 95  BUN 18 15  CREATININE 0.90 0.87  CALCIUM 8.8* 8.8*  AST 31  --   ALT 75*  --   ALKPHOS 52  --   BILITOT 1.4*  --      Microbiology Results  Results  for orders placed or performed during the hospital encounter of 05/15/18  SARS Coronavirus 2 (CEPHEID- Performed in [facility] hospital lab), Hosp Order     Status: None   Collection Time: 05/15/18  4:48 PM  Result Value Ref Range Status   SARS Coronavirus 2 NEGATIVE NEGATIVE Final    Comment: (NOTE) If result is NEGATIVE SARS-CoV-2 target nucleic acids are NOT DETECTED. The SARS-CoV-2 RNA is generally detectable in upper and lower  respiratory specimens during the acute phase of infection. The lowest  concentration of SARS-CoV-2 viral copies this assay can detect is 250  copies / mL. A negative result does not preclude SARS-CoV-2 infection  and should not be used as the sole basis for treatment or other  patient management decisions.  A negative result may occur with  improper specimen collection / handling, submission of specimen other  than nasopharyngeal swab, presence of viral mutation(s) within the  areas targeted by this assay, and inadequate number of viral copies  (<250 copies / mL). A negative result must be  combined with clinical  observations, patient history, and epidemiological information. If result is POSITIVE SARS-CoV-2 target nucleic acids are DETECTED. The SARS-CoV-2 RNA is generally detectable in upper and lower  respiratory specimens dur ing the acute phase of infection.  Positive  results are indicative of active infection with SARS-CoV-2.  Clinical  correlation with patient history and other diagnostic information is  necessary to determine patient infection status.  Positive results do  not rule out bacterial infection or co-infection with other viruses. If result is PRESUMPTIVE POSTIVE SARS-CoV-2 nucleic acids MAY BE PRESENT.   A presumptive positive result was obtained on the submitted specimen  and confirmed on repeat testing.  While 2019 novel coronavirus  (SARS-CoV-2) nucleic acids may be present in the submitted sample  additional confirmatory testing may be necessary for epidemiological  and / or clinical management purposes  to differentiate between  SARS-CoV-2 and other Sarbecovirus currently known to infect humans.  If clinically indicated additional testing with an alternate test  methodology [phone]) is advised. The SARS-CoV-2 RNA is generally  detectable in upper and lower respiratory sp ecimens during the acute  phase of infection. The expected result is Negative. Fact Sheet for Patients:  BoilerBrush.com.cy Fact Sheet for Healthcare Providers: https://pope.com/ This test is not yet approved or cleared by the [place] FDA and has been authorized for detection and/or diagnosis of SARS-CoV-2 by FDA under an Emergency Use Authorization (EUA).  This EUA will remain in effect (meaning this test can be used) for the duration of the COVID-19 declaration under Section 564(b)(1) of the Act, 21 U.S.C. section 360bbb-3(b)(1), unless the authorization is terminated or revoked sooner. Performed at [facility], [address] [place], [place] [address]   Blood culture (routine x 2)     Status: None   Collection Time: 05/15/18  4:48 PM  Result Value Ref Range Status   Specimen Description BLOOD RIGHT ANTECUBITAL  Final   Special Requests   Final    BOTTLES DRAWN AEROBIC AND ANAEROBIC Blood Culture adequate volume   Culture   Final    NO GROWTH 5 DAYS Performed at [facility], [address], [place], [place] [address]    Report Status 05/20/2018 FINAL  Final  Blood culture (routine x 2)     Status: None   Collection Time: 05/15/18  4:48 PM  Result Value Ref Range Status   Specimen Description BLOOD LEFT ANTECUBITAL  Final  Special Requests   Final    BOTTLES DRAWN AEROBIC AND ANAEROBIC Blood Culture results may not be optimal due to an excessive volume of blood received in culture bottles   Culture   Final    NO GROWTH 5 DAYS Performed at [facility], [address] [place], [place] [address]    Report Status 05/20/2018 FINAL  Final  MRSA PCR Screening     Status: None   Collection Time: 05/15/18  8:55 PM  Result Value Ref Range Status   MRSA by PCR NEGATIVE NEGATIVE Final    Comment:        The GeneXpert MRSA Assay (FDA approved for NASAL specimens only), is one component of a comprehensive MRSA colonization surveillance program. It is not intended to diagnose MRSA infection nor to guide or monitor treatment for MRSA infections. Performed at [facility], [address] [place], [place] [address]   Expectorated sputum assessment w rflx to resp cult     Status: None   Collection Time: 05/16/18  6:00 AM  Result Value Ref Range Status   Specimen Description SPUTUM  Final   Special Requests NONE  Final   Sputum evaluation   Final    THIS SPECIMEN IS ACCEPTABLE FOR SPUTUM CULTURE Performed at [facility], [address], [place], [place] [address]    Report Status 05/16/2018 FINAL  Final  Culture, respiratory     Status: None    Collection Time: 05/16/18  6:00 AM  Result Value Ref Range Status   Specimen Description   Final    SPUTUM Performed at [facility], [address], [place], [place] [address]    Special Requests   Final    NONE Reflexed from [id number] Performed at [facility], [address] [place], [place] [address]    Gram Stain   Final    NO WBC SEEN FEW GRAM NEGATIVE COCCOBACILLI FEW GRAM POSITIVE COCCI IN PAIRS RARE GRAM POSITIVE RODS    Culture   Final    MODERATE Consistent with normal respiratory flora. Performed at [facility], [address], [place], [place] [address]    Report Status 05/18/2018 FINAL  Final  SARS Coronavirus 2 [facility] order, Performed in [facility] hospital lab)     Status: None   Collection Time: 05/16/18  8:25 AM  Result Value Ref Range Status   SARS Coronavirus 2 NEGATIVE NEGATIVE Final    Comment: (NOTE) If result is NEGATIVE SARS-CoV-2 target nucleic acids are NOT DETECTED. The SARS-CoV-2 RNA is generally detectable in upper and lower  respiratory specimens during the acute phase of infection. The lowest  concentration of SARS-CoV-2 viral copies this assay can detect is 250  copies / mL. A negative result does not preclude SARS-CoV-2 infection  and should not be used as the sole basis for treatment or other  patient management decisions.  A negative result may occur with  improper specimen collection / handling, submission of specimen other  than nasopharyngeal swab, presence of viral mutation(s) within the  areas targeted by this assay, and inadequate number of viral copies  (<250 copies / mL). A negative result must be combined with clinical  observations, patient history, and epidemiological information. If result is POSITIVE SARS-CoV-2 target nucleic acids are DETECTED. The SARS-CoV-2 RNA is generally detectable in upper and lower  respiratory specimens dur ing the acute phase of infection.  Positive  results are  indicative of active infection with SARS-CoV-2.  Clinical  correlation with patient history and other diagnostic information is  necessary to determine patient infection status.  Positive results do  not rule out bacterial infection or co-infection with other viruses. If result is PRESUMPTIVE POSTIVE SARS-CoV-2 nucleic acids MAY BE PRESENT.   A presumptive positive result was obtained on the submitted specimen  and confirmed on repeat testing.  While 2019 novel coronavirus  (SARS-CoV-2) nucleic acids may be present in the submitted sample  additional confirmatory testing may be necessary for epidemiological  and / or clinical management purposes  to differentiate between  SARS-CoV-2 and other Sarbecovirus currently known to infect humans.  If clinically indicated additional testing with an alternate test  methodology [phone]) is advised. The SARS-CoV-2 RNA is generally  detectable in upper and lower respiratory sp ecimens during the acute  phase of infection. The expected result is Negative. Fact Sheet for Patients:  BoilerBrush.com.cyhttps://www.fda.gov/media/136312/download Fact Sheet for Healthcare Providers: https://pope.com/https://www.fda.gov/media/136313/download This test is not yet approved or cleared by the [place] FDA and has been authorized for detection and/or diagnosis of SARS-CoV-2 by FDA under an Emergency Use Authorization (EUA).  This EUA will remain in effect (meaning this test can be used) for the duration of the COVID-19 declaration under Section 564(b)(1) of the Act, 21 U.S.C. section 360bbb-3(b)(1), unless the authorization is terminated or revoked sooner. Performed at [facility], [address] [place], [place] [address]     RADIOLOGY:  No results found.   Management plans discussed with the patient, family and they are in agreement.  CODE STATUS:  Code Status History    Date Active Date Inactive Code Status Order ID Comments User Context   05/15/2018 1943 05/19/2018  1839 Full Code [id number]  Altamese DillingVachhani, Vaibhavkumar, MD ED      TOTAL TIME TAKING CARE OF THIS PATIENT: 38 minutes.    Enid Baasadhika Jejuan Scala M.D on 05/20/2018 at 1:15 PM  Between 7am to 6pm - Pager - [phone]  After 6pm go to [email or web address] - Social research officer, governmentpassword EPAS [facility]  [facility] [place] [facility]  Office  [phone]  CC: Primary care physician; Tamsen Roershrismon, Dennis E, PA   Note: This dictation was prepared with Dragon dictation along with smaller phrase technology. Any transcriptional errors that result from this process are unintentional.

## 2018-05-22 ENCOUNTER — Telehealth: Payer: Self-pay

## 2018-05-22 NOTE — Telephone Encounter (Signed)
   TELEPHONE CALL NOTE  This patient has been deemed a candidate for follow-up tele-health visit to limit community exposure during the Covid-19 pandemic. I spoke with the patient via phone to discuss instructions. The patient was advised to review the section on consent for treatment as well. The patient will receive a phone call 2-3 days prior to their E-Visit at which time consent will be verbally confirmed. A Virtual Office Visit appointment type has been scheduled for 05/25/2018 with [facility].  Nira Retort L, CMA 05/22/2018 1:18 PM

## 2018-05-22 NOTE — Telephone Encounter (Signed)
TELEPHONE CALL NOTE  Joshua Mayo has been deemed a candidate for a follow-up tele-health visit to limit community exposure during the Covid-19 pandemic. I spoke with the patient via phone to ensure availability of phone/video source, confirm preferred email & phone number, discuss instructions and expectations, and review consent.   I reminded Joshua Mayo to be prepared with any vital sign and/or heart rhythm information that could potentially be obtained via home monitoring, at the time of his visit.  Finally, I reminded Joshua Mayo to expect an e-mail containing a link for their video-based visit approximately 15 minutes before his visit, or alternatively, a phone call at the time of his visit if his visit is planned to be a phone encounter.  Did the patient verbally consent to treatment as below? Yes  ALLRED, AMANDA L, CMA 05/22/2018 1:18 PM  CONSENT FOR TELE-HEALTH VISIT - PLEASE REVIEW  I hereby voluntarily request, consent and authorize [facility] and its employed or contracted physicians, physician assistants, nurse practitioners or other licensed health care professionals (the Practitioner), to provide me with telemedicine health care services (the "Services") as deemed necessary by the treating Practitioner. I acknowledge and consent to receive the Services by the Practitioner via telemedicine. I understand that the telemedicine visit will involve communicating with the Practitioner through telephonic communication technology and the disclosure of certain medical information by electronic transmission. I acknowledge that I have been given the opportunity to request an in-person assessment or other available alternative prior to the telemedicine visit and am voluntarily participating in the telemedicine visit.  I understand that I have the right to withhold or withdraw my consent to the use of telemedicine in the course of my care at any time, without affecting  my right to future care or treatment, and that the Practitioner or I may terminate the telemedicine visit at any time. I understand that I have the right to inspect all information obtained and/or recorded in the course of the telemedicine visit and may receive copies of available information for a reasonable fee.  I understand that some of the potential risks of receiving the Services via telemedicine include:  Marland Kitchen Delay or interruption in medical evaluation due to technological equipment failure or disruption; . Information transmitted may not be sufficient (e.g. poor resolution of images) to allow for appropriate medical decision making by the Practitioner; and/or  . In rare instances, security protocols could fail, causing a breach of personal health information.  Furthermore, I acknowledge that it is my responsibility to provide information about my medical history, conditions and care that is complete and accurate to the best of my ability. I acknowledge that Practitioner's advice, recommendations, and/or decision may be based on factors not within their control, such as incomplete or inaccurate data provided by me or lack of visual representation. I understand that the practice of medicine is not an exact science and that Practitioner makes no warranties or guarantees regarding treatment outcomes. I acknowledge that I will receive a copy of this consent concurrently upon execution via email to the email address I last provided but may also request a printed copy by calling the office of [facility].    I understand that my insurance may be billed for this visit.   I have read or had this consent read to me. . I understand the contents of this consent, which adequately explains the benefits and risks of the Services being provided via telemedicine.  Marland Kitchen  I have been provided ample opportunity to ask questions regarding this consent and the Services and have had my questions answered to my  satisfaction. . I give my informed consent for the services to be provided through the use of telemedicine in my medical care  By participating in this telemedicine visit I agree to the above.

## 2018-05-22 NOTE — Telephone Encounter (Signed)
Spoke to patient regarding lab results and upcoming appointment.

## 2018-05-22 NOTE — Telephone Encounter (Signed)
-----   Message from Tamsen Roers, [place] sent at 05/22/2018  8:27 AM EDT ----- Blood cultures from hospitalization were negative for bacterial growth. Proceed with CHF per cardiologist.

## 2018-05-22 NOTE — Telephone Encounter (Signed)
Transition Care Management Follow-up Telephone Call  Date of discharge and from where: [facility] on 05/19/18  How have you been since you were released from the hospital? Doing fine, feels better. Declines SOB, pain, fever or n/v/d.   Any questions or concerns? No   Items Reviewed:  Did the pt receive and understand the discharge instructions provided? Yes   Medications obtained and verified? Declines reviewing meds at this time.   Any new allergies since your discharge? No  Dietary orders reviewed? Yes  Do you have support at home? Yes   Other (ie: DME, Home Health, etc) N/A  Functional Questionnaire: (I = Independent and D = Dependent)  Bathing/Dressing- I   Meal Prep- I  Eating- I  Maintaining continence- I  Transferring/Ambulation- I  Managing Meds- I   Follow up appointments reviewed:    PCP Hospital f/u appt confirmed? No , pt to call office and schedule HFU apt today.   Specialist Hospital f/u appt confirmed? Yes    Are transportation arrangements needed? No   If their condition worsens, is the pt aware to call  their PCP or go to the ED? Yes  Was the patient provided with contact information for the PCP's office or ED? Yes  Was the pt encouraged to call back with questions or concerns? Yes

## 2018-05-22 NOTE — Telephone Encounter (Signed)
No HFU scheduled.  

## 2018-05-23 ENCOUNTER — Other Ambulatory Visit: Payer: Self-pay

## 2018-05-23 ENCOUNTER — Encounter: Payer: Self-pay | Admitting: Family Medicine

## 2018-05-23 ENCOUNTER — Ambulatory Visit (INDEPENDENT_AMBULATORY_CARE_PROVIDER_SITE_OTHER): Payer: BLUE CROSS/BLUE SHIELD | Admitting: Family Medicine

## 2018-05-23 VITALS — BP 100/80 | HR 100 | Temp 98.5°F | Wt 178.0 lb

## 2018-05-23 DIAGNOSIS — J9601 Acute respiratory failure with hypoxia: Secondary | ICD-10-CM | POA: Diagnosis not present

## 2018-05-23 DIAGNOSIS — I509 Heart failure, unspecified: Secondary | ICD-10-CM | POA: Diagnosis not present

## 2018-05-23 NOTE — Progress Notes (Signed)
Joshua Mayo  MRN: [id number] DOB: [birth year]  Subjective:  HPI   The patient is a 43 year old male who presents today for follow up after hospitalization.   Patient was admitted on 05/15/18 and discharged  On 05/19/18 with discharge diagnosis of Acute Respiratory Failure and Sepsis.  While in the hospital the patient tested negative for COVID 19 on two tests.  He was also diagnosed with new onset heart failure with EF of 20-25%.    Medications instructions at time of discharge include  STOP taking these medications   cyclobenzaprine 5 MG tablet Commonly known as:  FLEXERIL   guaiFENesin 600 MG 12 hr tablet Commonly known as:  MUCINEX   predniSONE 5 MG tablet Commonly known as:  DELTASONE   pseudoephedrine 30 MG tablet Commonly known as:  SUDAFED     TAKE these medications   acetaminophen 500 MG tablet Commonly known as:  TYLENOL Take 500-1,000 mg by mouth every 6 (six) hours as needed for mild pain or fever.   aspirin 81 MG chewable tablet Chew 1 tablet (81 mg total) by mouth daily.   azithromycin 500 MG tablet Commonly known as:  ZITHROMAX Take 1 tablet (500 mg total) by mouth daily for 4 days. What changed:    medication strength  how much to take  how to take this  when to take this  additional instructions   carvedilol 12.5 MG tablet Commonly known as:  COREG Take 1 tablet (12.5 mg total) by mouth 2 (two) times daily with a meal.   furosemide 20 MG tablet Commonly known as:  LASIX Take 1 tablet (20 mg total) by mouth daily.   guaiFENesin-codeine 100-10 MG/5ML syrup Take 5 mLs by mouth every 6 (six) hours as needed for cough.   losartan 50 MG tablet Commonly known as:  COZAAR Take 1 tablet (50 mg total) by mouth daily.   Omeprazole 20 MG Tbec Take 40 mg by mouth 2 (two) times daily.   spironolactone 25 MG tablet Commonly known as:  ALDACTONE Take 1 tablet (25 mg total) by mouth daily.   The patient was discharged with  instruction to follow up with both PCP and Cardiology.  He was instructed on Cardiac diet, activity as tolerated and no need for oxygen use    Patient Active Problem List   Diagnosis Date Noted   Acute respiratory failure with hypoxia (HCC) 05/15/2018   Esophageal reflux 01/08/2016   Internal hemorrhoids without complication 01/08/2016   Snoring 01/08/2016    Past Medical History:  Diagnosis Date   Hypertension    No past surgical history on file.   Family History  Problem Relation Age of Onset   Heart disease Father    Heart disease Paternal Grandfather    Social History   Socioeconomic History   Marital status: Married    Spouse name: Not on file   Number of children: Not on file   Years of education: Not on file   Highest education level: Not on file  Occupational History   Not on file  Social Needs   Financial resource strain: Not on file   Food insecurity:    Worry: Not on file    Inability: Not on file   Transportation needs:    Medical: Not on file    Non-medical: Not on file  Tobacco Use   Smoking status: Current Every Day Smoker    Packs/day: 1.00    Years: 20.00    Pack years:  20.00    Types: Cigarettes   Smokeless tobacco: Never Used  Substance and Sexual Activity   Alcohol use: Yes    Comment: weekends   Drug use: No    Types: Cocaine    Comment: previously used cocaine. Pt states he stopped on his own with the help of his current wife    Sexual activity: Yes  Lifestyle   Physical activity:    Days per week: Not on file    Minutes per session: Not on file   Stress: Not on file  Relationships   Social connections:    Talks on phone: Not on file    Gets together: Not on file    Attends religious service: Not on file    Active member of club or organization: Not on file    Attends meetings of clubs or organizations: Not on file    Relationship status: Not on file   Intimate partner violence:    Fear of current or ex  partner: Not on file    Emotionally abused: Not on file    Physically abused: Not on file    Forced sexual activity: Not on file  Other Topics Concern   Not on file  Social History Narrative   Not on file    Outpatient Encounter Medications as of 05/23/2018  Medication Sig   acetaminophen (TYLENOL) 500 MG tablet Take 500-1,000 mg by mouth every 6 (six) hours as needed for mild pain or fever.   aspirin 81 MG chewable tablet Chew 1 tablet (81 mg total) by mouth daily.   carvedilol (COREG) 12.5 MG tablet Take 1 tablet (12.5 mg total) by mouth 2 (two) times daily with a meal.   furosemide (LASIX) 20 MG tablet Take 1 tablet (20 mg total) by mouth daily.   losartan (COZAAR) 50 MG tablet Take 1 tablet (50 mg total) by mouth daily.   Omeprazole 20 MG TBEC Take 40 mg by mouth 2 (two) times daily.    spironolactone (ALDACTONE) 25 MG tablet Take 1 tablet (25 mg total) by mouth daily.   azithromycin (ZITHROMAX) 500 MG tablet Take 1 tablet (500 mg total) by mouth daily for 4 days. (Patient not taking: Reported on 05/23/2018)   guaiFENesin-codeine 100-10 MG/5ML syrup Take 5 mLs by mouth every 6 (six) hours as needed for cough. (Patient not taking: Reported on 05/23/2018)   No facility-administered encounter medications on file as of 05/23/2018.    Allergies  Allergen Reactions   Banana Anaphylaxis   Review of Systems  Constitutional: Positive for malaise/fatigue. Negative for chills and fever.  Respiratory: Positive for cough, sputum production and shortness of breath (worse today). Negative for hemoptysis and wheezing.   Cardiovascular: Positive for chest pain (no change) and palpitations (no change). Negative for orthopnea, claudication and leg swelling.    Objective:  BP 100/80 (BP Location: Right Arm, Patient Position: Sitting, Cuff Size: Normal)    Pulse 100    Temp 98.5 F (36.9 C) (Oral)    Wt 178 lb (80.7 kg)    SpO2 99%    BMI 27.88 kg/m  Wt Readings from Last 3 Encounters:    05/23/18 178 lb (80.7 kg)  05/19/18 176 lb 12.8 oz (80.2 kg)  11/01/17 186 lb 12.8 oz (84.7 kg)   Physical Exam  Constitutional: He is oriented to person, place, and time and well-developed, well-nourished, and in no distress.  HENT:  Head: Normocephalic.  Eyes: Conjunctivae are normal.  Neck: Neck supple.  Cardiovascular: Normal  rate.  Pulmonary/Chest: Effort normal.  Abdominal: Soft.  Musculoskeletal: Normal range of motion.  Neurological: He is alert and oriented to person, place, and time.  Skin: No rash noted.  Psychiatric: Mood, affect and judgment normal.    Assessment and Plan :   1. New onset of congestive heart failure (HCC) Hospitalized 05-15-18 to 05-19-18 due to acute respiratory failure secondary to acute systolic heart failure with EF 20-25% on echocardiogram and BNP of 955.0. Was in ICU requiring BiPAP on admission. COVID negative twice during hospitalization. Presently on Lasix, Losartan and Coreg treatment. Will recheck labs and encouraged to proceed with cardiology follow up. States he was told a cardiac catheterization will be scheduled soon. Has an appointment to with CHF clinic [facility], [place]) on 05-25-18. - CBC with Differential/Platelet - Comprehensive metabolic panel  2. Acute respiratory failure with hypoxia (HCC) Pulse oximetry up to 99% today but still has dyspnea on exertion and fatigue. Never had any peripheral edema. States he feel better than his admission hypoxia. Finished the Z-pak yesterday but still has some non-productive cough occasionally. No fever today or at home. Will recheck labs and follow up pending reports. States he has decreased smoking to 3-4 cigarettes a day from a 1 ppd habit. Encouraged to proceed with totally quitting. - CBC with Differential/Platelet

## 2018-05-23 NOTE — Patient Instructions (Signed)
Heart Failure °Heart failure is a condition in which the heart has trouble pumping blood because it has become weak or stiff. This means that the heart does not pump blood efficiently for the body to work well. For some people with heart failure, fluid may back up into the lungs and there may be swelling (edema) in the lower legs. Heart failure is usually a long-term (chronic) condition. It is important for you to take good care of yourself and follow the treatment plan from your health care provider. °What are the causes? °This condition is caused by some health problems, including: °· High blood pressure (hypertension). Hypertension causes the heart muscle to work harder than normal. High blood pressure eventually causes the heart to become stiff and weak. °· Coronary artery disease (CAD). CAD is the buildup of cholesterol and fat (plaques) in the arteries of the heart. °· Heart attack (myocardial infarction). Injured tissue, which is caused by the heart attack, does not contract as well and the heart's ability to pump blood is weakened. °· Abnormal heart valves. When the heart valves do not open and close properly, the heart muscle must pump harder to keep the blood flowing. °· Heart muscle disease (cardiomyopathy or myocarditis). Heart muscle disease is damage to the heart muscle from a variety of causes, such as drug or alcohol abuse, infections, or unknown causes. These can increase the risk of heart failure. °· Lung disease. When the lungs do not work properly, the heart must work harder. °What increases the risk? °Risk of heart failure increases as a person ages. This condition is also more likely to develop in people who: °· Are overweight. °· Are male. °· Smoke or chew tobacco. °· Abuse alcohol or illegal drugs. °· Have taken medicines that can damage the heart, such as chemotherapy drugs. °· Have diabetes. °? High blood sugar (glucose) is associated with high fat (lipid) levels in the blood. °? Diabetes  can also damage tiny blood vessels that carry nutrients to the heart muscle. °· Have abnormal heart rhythms. °· Have thyroid problems. °· Have low blood counts (anemia). °What are the signs or symptoms? °Symptoms of this condition include: °· Shortness of breath with activity, such as when climbing stairs. °· Persistent cough. °· Swelling of the feet, ankles, legs, or abdomen. °· Unexplained weight gain. °· Difficulty breathing when lying flat (orthopnea). °· Waking from sleep because of the need to sit up and get more air. °· Rapid heartbeat. °· Fatigue and loss of energy. °· Feeling light-headed, dizzy, or close to fainting. °· Loss of appetite. °· Nausea. °· Increased urination during the night (nocturia). °· Confusion. °How is this diagnosed? °This condition is diagnosed based on: °· Medical history, symptoms, and a physical exam. °· Diagnostic tests, which may include: °? Echocardiogram. °? Electrocardiogram (ECG). °? Chest X-ray. °? Blood tests. °? Exercise stress test. °? Radionuclide scans. °? Cardiac catheterization and angiogram. °How is this treated? °Treatment for this condition is aimed at managing the symptoms of heart failure. Medicines, behavioral changes, or other treatments may be necessary to treat heart failure. °Medicines °These may include: °· Angiotensin-converting enzyme (ACE) inhibitors. This type of medicine blocks the effects of a blood protein called angiotensin-converting enzyme. ACE inhibitors relax (dilate) the blood vessels and help to lower blood pressure. °· Angiotensin receptor blockers (ARBs). This type of medicine blocks the actions of a blood protein called angiotensin. ARBs dilate the blood vessels and help to lower blood pressure. °· Water pills (diuretics). Diuretics cause   the kidneys to remove salt and water from the blood. The extra fluid is removed through urination, leaving a lower volume of blood that the heart has to pump. °· Beta blockers. These improve heart muscle  strength and they prevent the heart from beating too quickly. °· Digoxin. This increases the force of the heartbeat. °Healthy behavior changes °These may include: °· Reaching and maintaining a healthy weight. °· Stopping smoking or chewing tobacco. °· Eating heart-healthy foods. °· Limiting or avoiding alcohol. °· Stopping use of street drugs (illegal drugs). °· Physical activity. °Other treatments °These may include: °· Surgery to open blocked coronary arteries or repair damaged heart valves. °· Placement of a biventricular pacemaker to improve heart muscle function (cardiac resynchronization therapy). This device paces both the right ventricle and left ventricle. °· Placement of a device to treat serious abnormal heart rhythms (implantable cardioverter defibrillator, or ICD). °· Placement of a device to improve the pumping ability of the heart (left ventricular assist device, or LVAD). °· Heart transplant. This can cure heart failure, and it is considered for certain patients who do not improve with other therapies. °Follow these instructions at home: °Medicines °· Take over-the-counter and prescription medicines only as told by your health care provider. Medicines are important in reducing the workload of your heart, slowing the progression of heart failure, and improving your symptoms. °? Do not stop taking your medicine unless your health care provider told you to do that. °? Do not skip any dose of medicine. °? Refill your prescriptions before you run out of medicine. You need your medicines every day. °Eating and drinking ° °· Eat heart-healthy foods. Talk with a dietitian to make an eating plan that is right for you. °? Choose foods that contain no trans fat and are low in saturated fat and cholesterol. Healthy choices include fresh or frozen fruits and vegetables, fish, lean meats, legumes, fat-free or low-fat dairy products, and whole-grain or high-fiber foods. °? Limit salt (sodium) if directed by your  health care provider. Sodium restriction may reduce symptoms of heart failure. Ask a dietitian to recommend heart-healthy seasonings. °? Use healthy cooking methods instead of frying. Healthy methods include roasting, grilling, broiling, baking, poaching, steaming, and stir-frying. °· Limit your fluid intake if directed by your health care provider. Fluid restriction may reduce symptoms of heart failure. °Lifestyle ° °· Stop smoking or using chewing tobacco. Nicotine and tobacco can damage your heart and your blood vessels. Do not use nicotine gum or patches before talking to your health care provider. °· Limit alcohol intake to no more than 1 drink per day for non-pregnant women and 2 drinks per day for men. One drink equals 12 oz of beer, 5 oz of wine, or 1½ oz of hard liquor. °? Drinking more than that is harmful to your heart. Tell your health care provider if you drink alcohol several times a week. °? Talk with your health care provider about whether any level of alcohol use is safe for you. °? If your heart has already been damaged by alcohol or you have severe heart failure, drinking alcohol should be stopped completely. °· Stop use of illegal drugs. °· Lose weight if directed by your health care provider. Weight loss may reduce symptoms of heart failure. °· Do moderate physical activity if directed by your health care provider. People who are elderly and people with severe heart failure should consult with a health care provider for physical activity recommendations. °Monitor important information ° °· Weigh   yourself every day. Keeping track of your weight daily helps you to notice excess fluid sooner. °? Weigh yourself every morning after you urinate and before you eat breakfast. °? Wear the same amount of clothing each time you weigh yourself. °? Record your daily weight. Provide your health care provider with your weight record. °· Monitor and record your blood pressure as told by your health care  provider. °· Check your pulse as told by your health care provider. °Dealing with extreme temperatures °· If the weather is extremely hot: °? Avoid vigorous physical activity. °? Use air conditioning or fans or seek a cooler location. °? Avoid caffeine and alcohol. °? Wear loose-fitting, lightweight, and light-colored clothing. °· If the weather is extremely cold: °? Avoid vigorous physical activity. °? Layer your clothes. °? Wear mittens or gloves, a hat, and a scarf when you go outside. °? Avoid alcohol. °General instructions °· Manage other health conditions such as hypertension, diabetes, thyroid disease, or abnormal heart rhythms as told by your health care provider. °· Learn to manage stress. If you need help to do this, ask your health care provider. °· Plan rest periods when fatigued. °· Get ongoing education and support as needed. °· Participate in or seek rehabilitation as needed to maintain or improve independence and quality of life. °· Stay up to date with immunizations. Keeping current on pneumococcal and influenza immunizations is especially important to prevent respiratory infections. °· Keep all follow-up visits as told by your health care provider. This is important. °Contact a health care provider if: °· You have a rapid weight gain. °· You have increasing shortness of breath that is unusual for you. °· You are unable to participate in your usual physical activities. °· You tire easily. °· You cough more than normal, especially with physical activity. °· You have any swelling or more swelling in areas such as your hands, feet, ankles, or abdomen. °· You are unable to sleep because it is hard to breathe. °· You feel like your heart is beating quickly (palpitations). °· You become dizzy or light-headed when you stand up. °Get help right away if: °· You have difficulty breathing. °· You notice or your family notices a change in your awareness, such as having trouble staying awake or having difficulty  with concentration. °· You have pain or discomfort in your chest. °· You have an episode of fainting (syncope). °This information is not intended to replace advice given to you by your health care provider. Make sure you discuss any questions you have with your health care provider. °Document Released: 12/21/2004 Document Revised: 11/19/2016 Document Reviewed: 07/16/2015 °[employer] Interactive Patient Education © 2019 [employer] ° °

## 2018-05-24 LAB — COMPREHENSIVE METABOLIC PANEL
ALT: 68 IU/L — ABNORMAL HIGH (ref 0–44)
AST: 30 IU/L (ref 0–40)
Albumin/Globulin Ratio: 1.9 (ref 1.2–2.2)
Albumin: 4.8 g/dL (ref 4.0–5.0)
Alkaline Phosphatase: 67 IU/L (ref 39–117)
BUN/Creatinine Ratio: 31 — ABNORMAL HIGH (ref 9–20)
BUN: 23 mg/dL (ref 6–24)
Bilirubin Total: 0.4 mg/dL (ref 0.0–1.2)
CO2: 20 mmol/L (ref 20–29)
Calcium: 10 mg/dL (ref 8.7–10.2)
Chloride: 102 mmol/L (ref 96–106)
Creatinine, Ser: 0.75 mg/dL — ABNORMAL LOW (ref 0.76–1.27)
GFR calc Af Amer: 129 mL/min/{1.73_m2} (ref >59)
GFR calc non Af Amer: 112 mL/min/{1.73_m2} (ref >59)
Globulin, Total: 2.5 g/dL (ref 1.5–4.5)
Glucose: 90 mg/dL (ref 65–99)
Potassium: 4.6 mmol/L (ref 3.5–5.2)
Sodium: 139 mmol/L (ref 134–144)
Total Protein: 7.3 g/dL (ref 6.0–8.5)

## 2018-05-24 LAB — CBC WITH DIFFERENTIAL/PLATELET
Basophils Absolute: 0.1 10*3/uL (ref 0.0–0.2)
Basos: 2 %
EOS (ABSOLUTE): 0.2 10*3/uL (ref 0.0–0.4)
Eos: 3 %
Hematocrit: 40.7 % (ref 37.5–51.0)
Hemoglobin: 13.5 g/dL (ref 13.0–17.7)
Immature Grans (Abs): 0 10*3/uL (ref 0.0–0.1)
Immature Granulocytes: 0 %
Lymphocytes Absolute: 2.8 10*3/uL (ref 0.7–3.1)
Lymphs: 51 %
MCH: 29.2 pg (ref 26.6–33.0)
MCHC: 33.2 g/dL (ref 31.5–35.7)
MCV: 88 fL (ref 79–97)
Monocytes Absolute: 0.5 10*3/uL (ref 0.1–0.9)
Monocytes: 10 %
Neutrophils Absolute: 1.8 10*3/uL (ref 1.4–7.0)
Neutrophils: 34 %
Platelets: 375 10*3/uL (ref 150–450)
RBC: 4.63 x10E6/uL (ref 4.14–5.80)
RDW: 11.7 % (ref 11.6–15.4)
WBC: 5.5 10*3/uL (ref 3.4–10.8)

## 2018-05-25 ENCOUNTER — Telehealth: Payer: Self-pay

## 2018-05-25 ENCOUNTER — Ambulatory Visit: Payer: BLUE CROSS/BLUE SHIELD | Attending: Family | Admitting: Family

## 2018-05-25 ENCOUNTER — Telehealth: Payer: Self-pay | Admitting: Family

## 2018-05-25 ENCOUNTER — Other Ambulatory Visit: Payer: Self-pay

## 2018-05-25 ENCOUNTER — Encounter: Payer: Self-pay | Admitting: Family

## 2018-05-25 VITALS — BP 122/87 | Wt 171.1 lb

## 2018-05-25 DIAGNOSIS — I5022 Chronic systolic (congestive) heart failure: Secondary | ICD-10-CM

## 2018-05-25 DIAGNOSIS — I10 Essential (primary) hypertension: Secondary | ICD-10-CM

## 2018-05-25 NOTE — Progress Notes (Signed)
Virtual Visit via Telephone Note    Evaluation Performed:  Initial visit  This visit type was conducted due to national recommendations for restrictions regarding the COVID-19 Pandemic (e.g. social distancing).  This format is felt to be most appropriate for this patient at this time.  All issues noted in this document were discussed and addressed.  No physical exam was performed (except for noted visual exam findings with Video Visits).  Please refer to the patient's chart (MyChart message for video visits and phone note for telephone visits) for the patient's consent to telehealth for [facility]  Date:  05/25/2018   ID:  Joshua Mayo, DOB 1975/05/04, MRN [id number]  Patient Location:  [address] [place] [address]   Provider location:   [facility] [address] [place], [place] [address]  PCP:  Tamsen Roers, [place]  Cardiologist:  No primary care provider on file.  Electrophysiologist:  None   Chief Complaint:  Fatigue  History of Present Illness:    Joshua Mayo is a 43 y.o. male who presents via audio/video conferencing for a telehealth visit today.  Patient verified DOB and address.  The patient does not have symptoms concerning for COVID-19 infection (fever, chills, cough, or new SHORTNESS OF BREATH).   Patient reports moderate fatigue upon minimal exertion. He describes this as having been present for several months. He has associated palpitations, chest tightness and shortness of breath along with this. He denies any dizziness, swelling in her legs/ abdomen, difficulty sleeping or weight gain.   Prior CV studies:   The following studies were reviewed today:  Echo report from 05/19/2018 reviewed and showed an EF of 20-25% along with   Past Medical History:  Diagnosis Date  . Hypertension    No past surgical history on file.   Current Meds  Medication Sig  . aspirin 81 MG chewable tablet Chew 1 tablet (81 mg  total) by mouth daily.  . carvedilol (COREG) 12.5 MG tablet Take 1 tablet (12.5 mg total) by mouth 2 (two) times daily with a meal.  . furosemide (LASIX) 20 MG tablet Take 1 tablet (20 mg total) by mouth daily.  Marland Kitchen losartan (COZAAR) 50 MG tablet Take 1 tablet (50 mg total) by mouth daily.  . Omeprazole 20 MG TBEC Take 40 mg by mouth 2 (two) times daily.   Marland Kitchen spironolactone (ALDACTONE) 25 MG tablet Take 1 tablet (25 mg total) by mouth daily.     Allergies:   Banana   Social History   Tobacco Use  . Smoking status: Current Every Day Smoker    Packs/day: 1.00    Years: 20.00    Pack years: 20.00    Types: Cigarettes  . Smokeless tobacco: Never Used  Substance Use Topics  . Alcohol use: Yes    Comment: weekends  . Drug use: No    Types: Cocaine    Comment: previously used cocaine. Pt states he stopped on his own with the help of his current wife      Family Hx: The patient's family history includes Heart disease in his father and paternal grandfather.  ROS:   Please see the history of present illness.     All other systems reviewed and are negative.   Labs/Other Tests and Data Reviewed:    Recent Labs: 05/17/2018: B Natriuretic Peptide 505.0 05/23/2018: ALT 68; BUN 23; Creatinine, Ser 0.75; Hemoglobin 13.5; Platelets 375; Potassium 4.6; Sodium 139   Recent  Lipid Panel Lab Results  Component Value Date/Time   CHOL 239 (H) 05/16/2018 03:59 AM   CHOL 232 (H) 01/12/2016 03:03 PM   TRIG 95 05/16/2018 03:59 AM   HDL 100 05/16/2018 03:59 AM   HDL 77 01/12/2016 03:03 PM   CHOLHDL 2.4 05/16/2018 03:59 AM   LDLCALC 120 (H) 05/16/2018 03:59 AM   LDLCALC 144 (H) 01/12/2016 03:03 PM    Wt Readings from Last 3 Encounters:  05/25/18 171 lb 2 oz (77.6 kg)  05/23/18 178 lb (80.7 kg)  05/19/18 176 lb 12.8 oz (80.2 kg)     Exam:    Vital Signs:  BP 122/87   Wt 171 lb 2 oz (77.6 kg) Comment: self-reported  BMI 26.80 kg/m    Well nourished, well developed male in no  acute  distress.   ASSESSMENT & PLAN:    1. Chronic heart failure with reduced ejection fraction- - NYHA class III - euvolemic based on patient's description of symptoms - weighing daily and he was reminded to call for an overnight weight gain of >2 pounds or a weekly weight gain of >5 pounds - adding "minimal" salt and is trying to limit how much he's adding as he's aware that he needs to limit his daily sodium intake to 2000mg  / day. Has been reading food labels for sodium content.  - phone number given to patient for him to contact Dr. America BrownFath's office to get post-hospital appointment scheduled.  - discussed changing his losartan to entresto in the future when COVID-19 restrictions are lifted so that we can keep a close eye on his BP/ renal function - BNP 05/17/2018 was 505.0  2: HTN-    - home BP looked good today (122/87) - saw PCP (Chrismon) yesterday - BMP from 05/23/2018 reviewed and showed sodium 139, potassium 4.6, creatinine 0.75 and GFR 129  COVID-19 Education: The signs and symptoms of COVID-19 were discussed with the patient and how to seek care for testing (follow up with PCP or arrange E-visit).  The importance of social distancing was discussed today.  Patient Risk:   After full review of this patients clinical status, I feel that they are at least moderate risk at this time.  Time:   Today, I have spent 10 minutes with the patient with telehealth technology discussing medications, diet and symptoms to call about.      Medication Adjustments/Labs and Tests Ordered: Current medicines are reviewed at length with the patient today.  Concerns regarding medicines are outlined above.   Tests Ordered: No orders of the defined types were placed in this encounter.  Medication Changes: No orders of the defined types were placed in this encounter.   Disposition:  Follow-up in 1 month or sooner for any questions/problems before then.   Signed, Delma Freezeina A Hackney, FNP  05/25/2018 9:48  AM    [facility]

## 2018-05-25 NOTE — Telephone Encounter (Signed)
Patient notified of lab results

## 2018-05-25 NOTE — Telephone Encounter (Signed)
-----   Message from Tamsen Roers, [place] sent at 05/25/2018 10:40 AM EDT ----- Sodium and potassium normal now. Normal blood cell test. Everything coming back to normal.

## 2018-05-25 NOTE — Telephone Encounter (Signed)
Called patient at 8:40am and again at 8:53am for his [facility] Clinic telemedicine appointment on 05/25/2018 at 8:40am. Left messages both times asking patient to return my call. Will attempt to reschedule if we don't hear from him.

## 2018-05-25 NOTE — Patient Instructions (Signed)
Continue weighing daily and call for an overnight weight gain of > 2 pounds or a weekly weight gain of >5 pounds. 

## 2018-06-30 ENCOUNTER — Telehealth: Payer: Self-pay

## 2018-06-30 NOTE — Telephone Encounter (Signed)
TELEPHONE CALL NOTE  Joshua Mayo has been deemed a candidate for a follow-up tele-health visit to limit community exposure during the Covid-19 pandemic. I spoke with the patient via phone to ensure availability of phone/video source, confirm preferred email & phone number, discuss instructions and expectations, and review consent.   I reminded Joshua Mayo to be prepared with any vital sign and/or heart rhythm information that could potentially be obtained via home monitoring, at the time of his visit.  Finally, I reminded Joshua Mayo to expect an e-mail containing a link for their video-based visit approximately 15 minutes before his visit, or alternatively, a phone call at the time of his visit if his visit is planned to be a phone encounter.  Did the patient verbally consent to treatment as below? YES  Gaylord Shih, CMA 06/30/2018 12:32 PM  CONSENT FOR TELE-HEALTH VISIT - PLEASE REVIEW  I hereby voluntarily request, consent and authorize [facility] and its employed or contracted physicians, physician assistants, nurse practitioners or other licensed health care professionals (the Practitioner), to provide me with telemedicine health care services (the "Services") as deemed necessary by the treating Practitioner. I acknowledge and consent to receive the Services by the Practitioner via telemedicine. I understand that the telemedicine visit will involve communicating with the Practitioner through telephonic communication technology and the disclosure of certain medical information by electronic transmission. I acknowledge that I have been given the opportunity to request an in-person assessment or other available alternative prior to the telemedicine visit and am voluntarily participating in the telemedicine visit.  I understand that I have the right to withhold or withdraw my consent to the use of telemedicine in the course of my care at any time, without affecting  my right to future care or treatment, and that the Practitioner or I may terminate the telemedicine visit at any time. I understand that I have the right to inspect all information obtained and/or recorded in the course of the telemedicine visit and may receive copies of available information for a reasonable fee.  I understand that some of the potential risks of receiving the Services via telemedicine include:  Marland Kitchen Delay or interruption in medical evaluation due to technological equipment failure or disruption; . Information transmitted may not be sufficient (e.g. poor resolution of images) to allow for appropriate medical decision making by the Practitioner; and/or  . In rare instances, security protocols could fail, causing a breach of personal health information.  Furthermore, I acknowledge that it is my responsibility to provide information about my medical history, conditions and care that is complete and accurate to the best of my ability. I acknowledge that Practitioner's advice, recommendations, and/or decision may be based on factors not within their control, such as incomplete or inaccurate data provided by me or lack of visual representation. I understand that the practice of medicine is not an exact science and that Practitioner makes no warranties or guarantees regarding treatment outcomes. I acknowledge that I will receive a copy of this consent concurrently upon execution via email to the email address I last provided but may also request a printed copy by calling the office of [facility].    I understand that my insurance may be billed for this visit.   I have read or had this consent read to me. . I understand the contents of this consent, which adequately explains the benefits and risks of the Services being provided via telemedicine.  Marland Kitchen  I have been provided ample opportunity to ask questions regarding this consent and the Services and have had my questions answered to my  satisfaction. . I give my informed consent for the services to be provided through the use of telemedicine in my medical care  By participating in this telemedicine visit I agree to the above.

## 2018-06-30 NOTE — Telephone Encounter (Signed)
   TELEPHONE CALL NOTE  This patient has been deemed a candidate for follow-up tele-health visit to limit community exposure during the Covid-19 pandemic. I spoke with the patient via phone to discuss instructions. The patient was advised to review the section on consent for treatment as well. The patient will receive a phone call 2-3 days prior to their E-Visit at which time consent will be verbally confirmed. A Virtual Office Visit appointment type has been scheduled for 07/03/2018 with Darylene Price FNP.  Gaylord Shih, CMA 06/30/2018 12:32 PM

## 2018-07-03 ENCOUNTER — Ambulatory Visit: Payer: BC Managed Care – PPO | Attending: Family | Admitting: Family

## 2018-07-03 ENCOUNTER — Telehealth: Payer: Self-pay | Admitting: Family

## 2018-07-03 ENCOUNTER — Other Ambulatory Visit: Payer: Self-pay

## 2018-07-03 NOTE — Telephone Encounter (Signed)
Patient did not answer his phone at 9:07am nor at 9:21am. Messages were left both times asking patient to return our call so that this appointment can get rescheduled.

## 2018-07-05 ENCOUNTER — Encounter: Payer: Self-pay | Admitting: Family

## 2018-07-05 ENCOUNTER — Ambulatory Visit: Payer: BC Managed Care – PPO | Attending: Family | Admitting: Family

## 2018-07-05 ENCOUNTER — Other Ambulatory Visit: Payer: Self-pay

## 2018-07-05 VITALS — BP 145/90 | Wt 167.0 lb

## 2018-07-05 DIAGNOSIS — I10 Essential (primary) hypertension: Secondary | ICD-10-CM

## 2018-07-05 DIAGNOSIS — Z720 Tobacco use: Secondary | ICD-10-CM

## 2018-07-05 DIAGNOSIS — I5022 Chronic systolic (congestive) heart failure: Secondary | ICD-10-CM

## 2018-07-05 NOTE — Patient Instructions (Signed)
Continue weighing daily and call for an overnight weight gain of > 2 pounds or a weekly weight gain of >5 pounds. 

## 2018-07-05 NOTE — Progress Notes (Signed)
Virtual Visit via Telephone Note    Evaluation Performed:  Follow-up visit  This visit type was conducted due to national recommendations for restrictions regarding the COVID-19 Pandemic (e.g. social distancing).  This format is felt to be most appropriate for this patient at this time.  All issues noted in this document were discussed and addressed.  No physical exam was performed (except for noted visual exam findings with Video Visits).  Please refer to the patient's chart (MyChart message for video visits and phone note for telephone visits) for the patient's consent to telehealth for [facility]  Date:  07/05/2018   ID:  Joshua Mayo, DOB 22-Mar-1975, MRN [id number]  Patient Location:  [address] [place] [address]   Provider location:   [facility] [place] [address], [place] [address]  PCP:  Margo Common, [place]  Cardiologist:  Doristine Mango, [place] Electrophysiologist:  None   Chief Complaint:  fatigue  History of Present Illness:    Joshua Mayo is a 43 y.o. male who presents via audio/video conferencing for a telehealth visit today.  Patient verified DOB and address.  The patient does not have symptoms concerning for COVID-19 infection (fever, chills, cough, or new SHORTNESS OF BREATH).   Patient reports minimal fatigue upon moderate exertion. He describes this as chronic in nature having been present for several years. He has associated dizziness, palpitations, chest pain, difficulty sleeping and shortness of breath along with this. He denies any swelling in his legs/ abdomen or weight gain. He says that his difficulty sleeping is related to the stress with "everything right now".   Prior CV studies:   The following studies were reviewed today:  Echo report from 05/16/2018 reviewed and showed an EF of 20-25%  Past Medical History:  Diagnosis Date  . Hypertension    No past surgical history on file.   Current  Meds  Medication Sig  . aspirin 81 MG chewable tablet Chew 1 tablet (81 mg total) by mouth daily.  . carvedilol (COREG) 12.5 MG tablet Take 1 tablet (12.5 mg total) by mouth 2 (two) times daily with a meal.  . furosemide (LASIX) 20 MG tablet Take 1 tablet (20 mg total) by mouth daily. (Patient taking differently: Take 20 mg by mouth as needed. )  . losartan (COZAAR) 50 MG tablet Take 1 tablet (50 mg total) by mouth daily.  . Omeprazole 20 MG TBEC Take 40 mg by mouth as needed.   Marland Kitchen spironolactone (ALDACTONE) 25 MG tablet Take 1 tablet (25 mg total) by mouth daily.     Allergies:   Banana   Social History   Tobacco Use  . Smoking status: Current Every Day Smoker    Packs/day: 1.00    Years: 20.00    Pack years: 20.00    Types: Cigarettes  . Smokeless tobacco: Never Used  Substance Use Topics  . Alcohol use: Yes    Comment: weekends  . Drug use: No    Types: Cocaine    Comment: previously used cocaine. Pt states he stopped on his own with the help of his current wife      Family Hx: The patient's family history includes Heart disease in his father and paternal grandfather.  ROS:   Please see the history of present illness.     All other systems reviewed and are negative.   Labs/Other Tests and Data Reviewed:    Recent Labs: 05/17/2018: B Natriuretic Peptide  505.0 05/23/2018: ALT 68; BUN 23; Creatinine, Ser 0.75; Hemoglobin 13.5; Platelets 375; Potassium 4.6; Sodium 139   Recent Lipid Panel Lab Results  Component Value Date/Time   CHOL 239 (H) 05/16/2018 03:59 AM   CHOL 232 (H) 01/12/2016 03:03 PM   TRIG 95 05/16/2018 03:59 AM   HDL 100 05/16/2018 03:59 AM   HDL 77 01/12/2016 03:03 PM   CHOLHDL 2.4 05/16/2018 03:59 AM   LDLCALC 120 (H) 05/16/2018 03:59 AM   LDLCALC 144 (H) 01/12/2016 03:03 PM    Wt Readings from Last 3 Encounters:  07/05/18 167 lb (75.8 kg)  05/25/18 171 lb 2 oz (77.6 kg)  05/23/18 178 lb (80.7 kg)     Exam:    Vital Signs:  BP (!) 145/90  Comment: sefl-reported  Wt 167 lb (75.8 kg) Comment: self-reported  BMI 26.16 kg/m    Well nourished, well developed male in no  acute distress.   ASSESSMENT & PLAN:    1. Chronic heart failure with reduced ejection fraction- - NYHA class II - euvolemic based on patient's description of symptoms - weighing daily and he was reminded to call for an overnight weight gain of >2 pounds or a weekly weight gain of >5 pounds - adding "minimal" salt when he cooks although he's trying to limit how much he's adding as he's aware that he needs to limit his daily sodium intake to 2000mg  / day. Has been reading food labels for sodium content.  - saw cardiology Zonia Kief([facility]) 06/21/2018 - discussed changing his losartan to entresto in the future when COVID-19 restrictions are lifted so that we can keep a close eye on his BP/ renal function - BNP 05/17/2018 was 505.0  2: HTN-    - home BP mildly elevated today - saw PCP (Chrismon) 05/23/2018 - BMP from 05/23/2018 reviewed and showed sodium 139, potassium 4.6, creatinine 0.75 and GFR 129  3: Tobacco use- - smoking <1/2 ppd of cigarettes - complete cessation discussed for 3 minutes with patient  COVID-19 Education: The signs and symptoms of COVID-19 were discussed with the patient and how to seek care for testing (follow up with PCP or arrange E-visit).  The importance of social distancing was discussed today.  Patient Risk:   After full review of this patients clinical status, I feel that they are at least moderate risk at this time.  Time:   Today, I have spent 7 minutes with the patient with telehealth technology discussing medications and symptoms to report     Medication Adjustments/Labs and Tests Ordered: Current medicines are reviewed at length with the patient today.  Concerns regarding medicines are outlined above.   Tests Ordered: No orders of the defined types were placed in this encounter.  Medication Changes: No orders of the  defined types were placed in this encounter.   Disposition: Follow-up in 4 months or sooner for any questions/problems before then  Signed, Delma Freezeina A Hackney, FNP  07/05/2018 1:19 PM    [facility]

## 2018-08-07 ENCOUNTER — Other Ambulatory Visit: Payer: Self-pay | Admitting: *Deleted

## 2018-08-07 DIAGNOSIS — I429 Cardiomyopathy, unspecified: Secondary | ICD-10-CM

## 2018-08-07 DIAGNOSIS — R079 Chest pain, unspecified: Secondary | ICD-10-CM

## 2018-08-07 NOTE — Progress Notes (Signed)
di

## 2018-08-15 ENCOUNTER — Telehealth (HOSPITAL_COMMUNITY): Payer: Self-pay | Admitting: Emergency Medicine

## 2018-08-15 NOTE — Telephone Encounter (Signed)
Reaching out to patient to offer assistance regarding upcoming cardiac imaging study; pt verbalizes understanding of appt date/time, parking situation and where to check in, pre-test NPO status and medications ordered, and verified current allergies; name and call back number provided for further questions should they arise Sameerah Nachtigal RN Navigator Cardiac Imaging [facility] [phone] office [phone] cell  Pt denies covid symptoms, verbalized understanding of visitor policy. 

## 2018-08-16 ENCOUNTER — Other Ambulatory Visit: Payer: Self-pay

## 2018-08-16 ENCOUNTER — Ambulatory Visit
Admission: RE | Admit: 2018-08-16 | Discharge: 2018-08-16 | Disposition: A | Discharge status: Home / Self Care | Payer: BC Managed Care – PPO | Source: Ambulatory Visit | Attending: Cardiology | Admitting: Cardiology

## 2018-08-16 DIAGNOSIS — R0789 Other chest pain: Secondary | ICD-10-CM | POA: Insufficient documentation

## 2018-08-16 DIAGNOSIS — I429 Cardiomyopathy, unspecified: Secondary | ICD-10-CM | POA: Diagnosis not present

## 2018-08-16 DIAGNOSIS — R079 Chest pain, unspecified: Secondary | ICD-10-CM

## 2018-08-16 LAB — POCT I-STAT CREATININE: Creatinine, Ser: 1.3 mg/dL — ABNORMAL HIGH (ref 0.61–1.24)

## 2018-08-16 MED ORDER — METOPROLOL TARTRATE 5 MG/5ML IV SOLN
5.0000 mg | INTRAVENOUS | Status: DC | PRN
  Administered 2018-08-16 (×2): 5 mg via INTRAVENOUS

## 2018-08-16 MED ORDER — NITROGLYCERIN 0.4 MG SL SUBL: 0.8000 mg | SUBLINGUAL_TABLET | Freq: Once | SUBLINGUAL | Status: DC

## 2018-08-16 MED ORDER — IOHEXOL 350 MG/ML SOLN: 100.0000 mL | Freq: Once | INTRAVENOUS | Status: DC | PRN

## 2018-08-16 MED ORDER — DILTIAZEM HCL 25 MG/5ML IV SOLN
10.0000 mg | Freq: Once | INTRAVENOUS | Status: AC
  Administered 2018-08-16: 10 mg via INTRAVENOUS

## 2018-08-16 NOTE — Progress Notes (Signed)
Patient's heart rate 80-82 after medications. Notified cardiology cancelled test at this time.

## 2018-08-16 NOTE — Progress Notes (Signed)
Spoke with cardiology HR continues to be 85-93 after 2 doses of Metoprolol 5 mg each. Ordered Diltiazem 10 mg IVP once.

## 2018-08-23 ENCOUNTER — Other Ambulatory Visit: Payer: BC Managed Care – PPO

## 2018-08-23 ENCOUNTER — Inpatient Hospital Stay: Admission: RE | Admit: 2018-08-23 | Payer: BC Managed Care – PPO | Source: Ambulatory Visit

## 2018-08-23 ENCOUNTER — Ambulatory Visit (HOSPITAL_COMMUNITY): Payer: BC Managed Care – PPO

## 2018-08-28 ENCOUNTER — Telehealth (HOSPITAL_COMMUNITY): Payer: Self-pay | Admitting: Emergency Medicine

## 2018-08-28 NOTE — Telephone Encounter (Signed)
Reaching out to patient to offer assistance regarding upcoming cardiac imaging study; pt verbalizes understanding of appt date/time, parking situation and where to check in, pre-test NPO status and medications ordered, and verified current allergies; name and call back number provided for further questions should they arise Elva Mauro RN Navigator Cardiac Imaging [facility] [phone] office [phone] cell  Pt denies covid symptoms, verbalized understanding of visitor policy. 

## 2018-08-29 ENCOUNTER — Encounter: Payer: BC Managed Care – PPO | Admitting: *Deleted

## 2018-08-29 ENCOUNTER — Ambulatory Visit (HOSPITAL_COMMUNITY): Payer: BC Managed Care – PPO

## 2018-08-29 ENCOUNTER — Ambulatory Visit (HOSPITAL_COMMUNITY)
Admission: RE | Admit: 2018-08-29 | Discharge: 2018-08-29 | Disposition: A | Discharge status: Home / Self Care | Payer: BC Managed Care – PPO | Source: Ambulatory Visit | Attending: Cardiology | Admitting: Cardiology

## 2018-08-29 ENCOUNTER — Other Ambulatory Visit: Payer: Self-pay

## 2018-08-29 DIAGNOSIS — I2510 Atherosclerotic heart disease of native coronary artery without angina pectoris: Secondary | ICD-10-CM | POA: Insufficient documentation

## 2018-08-29 DIAGNOSIS — Z006 Encounter for examination for normal comparison and control in clinical research program: Secondary | ICD-10-CM

## 2018-08-29 MED ORDER — IOHEXOL 350 MG/ML SOLN
90.0000 mL | Freq: Once | INTRAVENOUS | Status: AC | PRN
  Administered 2018-08-29: 90 mL via INTRAVENOUS

## 2018-08-29 MED ORDER — DILTIAZEM HCL 25 MG/5ML IV SOLN
INTRAVENOUS | Status: AC
  Filled 2018-08-29: qty 5

## 2018-08-29 MED ORDER — NITROGLYCERIN 0.4 MG SL SUBL
0.8000 mg | SUBLINGUAL_TABLET | Freq: Once | SUBLINGUAL | Status: AC
  Administered 2018-08-29: 13:00:00 0.8 mg via SUBLINGUAL

## 2018-08-29 MED ORDER — NITROGLYCERIN 0.4 MG SL SUBL
SUBLINGUAL_TABLET | SUBLINGUAL | Status: AC
  Filled 2018-08-29: qty 2

## 2018-08-29 MED ORDER — METOPROLOL TARTRATE 5 MG/5ML IV SOLN
5.0000 mg | INTRAVENOUS | Status: DC | PRN
  Administered 2018-08-29 (×2): 5 mg via INTRAVENOUS

## 2018-08-29 MED ORDER — METOPROLOL TARTRATE 5 MG/5ML IV SOLN
INTRAVENOUS | Status: AC
  Filled 2018-08-29: qty 15

## 2018-08-29 MED ORDER — DILTIAZEM HCL 25 MG/5ML IV SOLN
5.0000 mg | Freq: Once | INTRAVENOUS | Status: AC
  Administered 2018-08-29: 5 mg via INTRAVENOUS

## 2018-08-29 NOTE — Progress Notes (Signed)
CT scan completed. Tolerated well. D/c home walking with wife. Awake and alert. In no distress 

## 2018-08-29 NOTE — Research (Signed)
CADFEM Informed Consent                  Subject Name:   Joshua Mayo   Subject met inclusion and exclusion criteria.  The informed consent form, study requirements and expectations were reviewed with the subject and questions and concerns were addressed prior to the signing of the consent form.  The subject verbalized understanding of the trial requirements.  The subject agreed to participate in the CADFEM trial and signed the informed consent.  The informed consent was obtained prior to performance of any protocol-specific procedures for the subject.  A copy of the signed informed consent was given to the subject and a copy was placed in the subject's medical record.   [place] Marily Konczal, Research Assistant   08/29/2018  11:28 a.m.

## 2018-08-29 NOTE — Progress Notes (Signed)
Pt here for CT heart. States he feels really weak since taking 100mg  Metoprolol pre scan. 20 mg IV metoprolol and 5mg  cardizem given with HR unchanged remaining in the high 70's low 80's. Dr. Gardiner Rhyme informed, ok to give pt 2nitro and scan.

## 2018-08-30 DIAGNOSIS — I2510 Atherosclerotic heart disease of native coronary artery without angina pectoris: Secondary | ICD-10-CM | POA: Diagnosis not present

## 2018-11-02 DIAGNOSIS — I5022 Chronic systolic (congestive) heart failure: Secondary | ICD-10-CM | POA: Diagnosis not present

## 2018-11-02 DIAGNOSIS — R002 Palpitations: Secondary | ICD-10-CM | POA: Diagnosis not present

## 2018-11-02 DIAGNOSIS — I426 Alcoholic cardiomyopathy: Secondary | ICD-10-CM | POA: Diagnosis not present

## 2018-11-15 DIAGNOSIS — R002 Palpitations: Secondary | ICD-10-CM | POA: Diagnosis not present

## 2018-11-21 ENCOUNTER — Ambulatory Visit: Payer: BC Managed Care – PPO | Admitting: Family

## 2018-11-21 NOTE — Progress Notes (Signed)
Patient ID: Joshua Mayo, male    DOB: August 23, 1975, 43 y.o.   MRN: [id number]  HPI  Joshua Mayo is a 43 y/o male with a history of  Echo report from 05/16/2018 reviewed and showed an EF of 20-25%.  Has not been admitted or been in the hospital in the last 6 months.   He presents today for a follow-up visit with a chief complaint of intermittent palpitations and chest pain. This is sporadic in nature and has been present for many months. Recently wore a 72 hour holter monitor and says that the results were fine. He has no other symptoms and specifically denies any difficulty sleeping, dizziness, abdominal distention, pedal edema, shortness of breath, cough, fatigue or weight gain.   Past Medical History:  Diagnosis Date  . CHF (congestive heart failure) (HCC)   . Hypertension    History reviewed. No pertinent surgical history. Family History  Problem Relation Age of Onset  . Heart disease Father   . Heart disease Paternal Grandfather    Social History   Tobacco Use  . Smoking status: Current Every Day Smoker    Packs/day: 1.00    Years: 20.00    Pack years: 20.00    Types: Cigarettes  . Smokeless tobacco: Never Used  Substance Use Topics  . Alcohol use: Yes    Comment: weekends   Allergies  Allergen Reactions  . Banana Anaphylaxis   Prior to Admission medications   Medication Sig Start Date End Date Taking? Authorizing Provider  carvedilol (COREG) 12.5 MG tablet Take 1 tablet (12.5 mg total) by mouth 2 (two) times daily with a meal. 05/19/18  Yes Enid Baas, MD  losartan (COZAAR) 50 MG tablet Take 1 tablet (50 mg total) by mouth daily. 05/20/18  Yes Enid Baas, MD  Omeprazole 20 MG TBEC Take 40 mg by mouth as needed.    Yes [provider]  potassium chloride (KLOR-CON) 10 MEQ tablet Take 10 mEq by mouth daily.   Yes [provider]  spironolactone (ALDACTONE) 25 MG tablet Take 1 tablet (25 mg total) by mouth daily. 05/20/18  Yes Enid Baas, MD    Review of Systems  Constitutional: Negative for appetite change and fatigue.  HENT: Negative for congestion, postnasal drip and sore throat.   Eyes: Negative.   Respiratory: Negative for cough and shortness of breath.   Cardiovascular: Positive for chest pain (on occasion) and palpitations (occasionally). Negative for leg swelling.  Gastrointestinal: Negative for abdominal distention and abdominal pain.  Endocrine: Negative.   Genitourinary: Negative.   Skin: Negative.   Allergic/Immunologic: Negative.   Neurological: Negative for dizziness and light-headedness.  Hematological: Negative for adenopathy. Does not bruise/bleed easily.  Psychiatric/Behavioral: Negative for dysphoric mood and sleep disturbance (sleeping on 1-2 pillows). The patient is not nervous/anxious.     Vitals:   11/22/18 1108  BP: 124/87  Pulse: 94  Resp: 18  SpO2: 100%  Weight: 174 lb 6.4 oz (79.1 kg)  Height: 5\' 8"  (1.727 m)   Wt Readings from Last 3 Encounters:  11/22/18 174 lb 6.4 oz (79.1 kg)  07/05/18 167 lb (75.8 kg)  05/25/18 171 lb 2 oz (77.6 kg)   Lab Results  Component Value Date   CREATININE 1.30 (H) 08/16/2018   CREATININE 0.75 (L) 05/23/2018   CREATININE 0.87 05/19/2018     Physical Exam Vitals signs and nursing note reviewed.  Constitutional:      Appearance: Normal appearance.  HENT:  Head: Normocephalic and atraumatic.  Neck:     Musculoskeletal: Normal range of motion and neck supple.  Cardiovascular:     Rate and Rhythm: Normal rate and regular rhythm.  Pulmonary:     Effort: Pulmonary effort is normal. No respiratory distress.     Breath sounds: No wheezing or rales.  Abdominal:     General: There is no distension.     Palpations: Abdomen is soft.     Tenderness: There is no abdominal tenderness.  Musculoskeletal:        General: No tenderness.     Right lower leg: No edema.     Left lower leg: No edema.  Skin:    General: Skin is warm and dry.   Neurological:     General: No focal deficit present.     Mental Status: He is alert and oriented to person, place, and time.  Psychiatric:        Behavior: Behavior normal.        Thought Content: Thought content normal.     ASSESSMENT & PLAN:    1. Chronic heart failure with reduced ejection fraction- - NYHA class I - euvolemic today - weighing daily and he was reminded to call for an overnight weight gain of >2 pounds or a weekly weight gain of >5 pounds - adding "minimal" salt when he cooks although he's trying to limit how much he's adding as he's aware that he needs to limit his daily sodium intake to 2000mg  / day. Has been reading food labels for sodium content.  - saw cardiology (Tate) 11/02/2018 - patient is currently class I so no need to switch his losartan to entresto at this time - BNP 05/17/2018 was 505.0  2: HTN-    - BP looks good today - saw PCP (Chrismon) 05/23/2018 - BMP from 08/02/2018 reviewed and showed sodium 141, potassium 3.4, creatinine 0.9 and GFR 111  3: Tobacco use- - smoking <1/2 ppd of cigarettes - complete cessation discussed for 3 minutes with patient  Pictures of medication bottles were reviewed.  Patient opts to not make a return appointment at this time. Advised him that he could call back at anytime to schedule another appointment.

## 2018-11-22 ENCOUNTER — Other Ambulatory Visit: Payer: Self-pay

## 2018-11-22 ENCOUNTER — Ambulatory Visit: Payer: BC Managed Care – PPO | Attending: Family | Admitting: Family

## 2018-11-22 ENCOUNTER — Encounter: Payer: Self-pay | Admitting: Family

## 2018-11-22 VITALS — BP 124/87 | HR 94 | Resp 18 | Ht 68.0 in | Wt 174.4 lb

## 2018-11-22 DIAGNOSIS — F17210 Nicotine dependence, cigarettes, uncomplicated: Secondary | ICD-10-CM | POA: Diagnosis not present

## 2018-11-22 DIAGNOSIS — I5022 Chronic systolic (congestive) heart failure: Secondary | ICD-10-CM | POA: Diagnosis not present

## 2018-11-22 DIAGNOSIS — R002 Palpitations: Secondary | ICD-10-CM | POA: Diagnosis not present

## 2018-11-22 DIAGNOSIS — I110 Hypertensive heart disease with heart failure: Secondary | ICD-10-CM | POA: Insufficient documentation

## 2018-11-22 DIAGNOSIS — Z8249 Family history of ischemic heart disease and other diseases of the circulatory system: Secondary | ICD-10-CM | POA: Diagnosis not present

## 2018-11-22 DIAGNOSIS — I10 Essential (primary) hypertension: Secondary | ICD-10-CM

## 2018-11-22 DIAGNOSIS — R079 Chest pain, unspecified: Secondary | ICD-10-CM | POA: Insufficient documentation

## 2018-11-22 DIAGNOSIS — Z720 Tobacco use: Secondary | ICD-10-CM

## 2018-11-22 DIAGNOSIS — Z79899 Other long term (current) drug therapy: Secondary | ICD-10-CM | POA: Insufficient documentation

## 2018-11-22 NOTE — Patient Instructions (Addendum)
Continue weighing daily and call for an overnight weight gain of > 2 pounds or a weekly weight gain of >5 pounds.  Call in the future for any questions/problems.

## 2018-12-10 DIAGNOSIS — Z20828 Contact with and (suspected) exposure to other viral communicable diseases: Secondary | ICD-10-CM | POA: Diagnosis not present

## 2019-02-05 DIAGNOSIS — I426 Alcoholic cardiomyopathy: Secondary | ICD-10-CM | POA: Diagnosis not present

## 2019-02-05 DIAGNOSIS — I5022 Chronic systolic (congestive) heart failure: Secondary | ICD-10-CM | POA: Diagnosis not present

## 2019-02-05 DIAGNOSIS — F1010 Alcohol abuse, uncomplicated: Secondary | ICD-10-CM | POA: Diagnosis not present

## 2019-02-05 DIAGNOSIS — Z720 Tobacco use: Secondary | ICD-10-CM | POA: Diagnosis not present

## 2019-04-10 ENCOUNTER — Inpatient Hospital Stay: Payer: Self-pay | Admitting: Adult Health

## 2019-12-17 ENCOUNTER — Ambulatory Visit: Payer: BLUE CROSS/BLUE SHIELD | Admitting: Family Medicine

## 2019-12-17 ENCOUNTER — Telehealth (INDEPENDENT_AMBULATORY_CARE_PROVIDER_SITE_OTHER): Payer: BC Managed Care – PPO | Admitting: Family Medicine

## 2019-12-17 ENCOUNTER — Encounter: Payer: Self-pay | Admitting: Family Medicine

## 2019-12-17 DIAGNOSIS — F411 Generalized anxiety disorder: Secondary | ICD-10-CM | POA: Diagnosis not present

## 2019-12-17 MED ORDER — SERTRALINE HCL 50 MG PO TABS: 50.0000 mg | ORAL_TABLET | Freq: Every day | ORAL | 3 refills | Status: DC

## 2019-12-17 NOTE — Progress Notes (Signed)
Telephone Visit    Virtual Visit via Video Note   This visit type was conducted due to national recommendations for restrictions regarding the COVID-19 Pandemic (e.g. social distancing) in an effort to limit this patient's exposure and mitigate transmission in our community. This patient is at least at moderate risk for complications without adequate follow up. This format is felt to be most appropriate for this patient at this time. Physical exam was limited by quality of the video and audio technology used for the visit.   Patient location: home Provider location: office  I discussed the limitations of evaluation and management by telemedicine and the availability of in person appointments. The patient expressed understanding and agreed to proceed.  Patient: Joshua Mayo   DOB: Jan 15, 1975   44 y.o. Male  MRN: [id number] Visit Date: 12/17/2019  Today's healthcare provider: Dortha Kern, PA-C   No chief complaint on file.  Subjective    HPI  The patient is a 44 year old male who presents via video visit for a new problem of anxiety.  He states he is under more stress with his work due to changes and he is now working from home.   He state his symptoms have been presents for about 6 months and getting progressively worse.   He has no history of treatment for depression or anxiety.  Past Medical History:  Diagnosis Date   CHF (congestive heart failure) (HCC)    Hypertension    No past surgical history on file. Social History   Tobacco Use   Smoking status: Current Every Day Smoker    Packs/day: 1.00    Years: 20.00    Pack years: 20.00    Types: Cigarettes   Smokeless tobacco: Never Used  Vaping Use   Vaping Use: Never used  Substance Use Topics   Alcohol use: Yes    Comment: weekends   Drug use: No    Types: Cocaine    Comment: previously used cocaine. Pt states he stopped on his own with the help of his current wife    Family History  Problem  Relation Age of Onset   Heart disease Father    Heart disease Paternal Grandfather    Allergies  Allergen Reactions   Banana Anaphylaxis      Medications: Outpatient Medications Prior to Visit  Medication Sig   carvedilol (COREG) 12.5 MG tablet Take 1 tablet (12.5 mg total) by mouth 2 (two) times daily with a meal.   losartan (COZAAR) 50 MG tablet Take 1 tablet (50 mg total) by mouth daily.   Omeprazole 20 MG TBEC Take 40 mg by mouth as needed.    potassium chloride (KLOR-CON) 10 MEQ tablet Take 10 mEq by mouth daily.   spironolactone (ALDACTONE) 25 MG tablet Take 1 tablet (25 mg total) by mouth daily.   No facility-administered medications prior to visit.    Review of Systems  Psychiatric/Behavioral: Positive for agitation, confusion, decreased concentration, dysphoric mood and sleep disturbance (difficulty falling and staying asleep). Negative for hallucinations and self-injury. The patient is nervous/anxious. The patient is not hyperactive.       Objective    There were no vitals taken for this visit.   Physical Exam: unable to connect the video visit. Changed to telephonic visit and no acute respiratory distress noted.     Assessment & Plan     1. Generalized anxiety disorder Has had increase in anxiety over the past 6 months. No specific trigger known.  Has changed to an at home job. Some sleep disturbance, diarrhea, nausea and generalized sense of stress. No suicidal thoughts. Eating one meal a day. Does not want any addictive medications. Will give trial of Zoloft and call report of response in 3-4 weeks. - sertraline (ZOLOFT) 50 MG tablet; Take 1 tablet (50 mg total) by mouth daily.  Dispense: 30 tablet; Refill: 3   No follow-ups on file.     I discussed the assessment and treatment plan with the patient. The patient was provided an opportunity to ask questions and all were answered. The patient agreed with the plan and demonstrated an understanding of the  instructions.   The patient was advised to call back or seek an in-person evaluation if the symptoms worsen or if the condition fails to improve as anticipated.  I provided 20 minutes of non-face-to-face time during this encounter.  I, Arita Severtson, PA-C, have reviewed all documentation for this visit. The documentation on 12/17/19 for the exam, diagnosis, procedures, and orders are all accurate and complete.   Dortha Kern, PA-C [employer] [phone] (phone) [phone] (fax)  [facility]

## 2019-12-20 ENCOUNTER — Encounter: Payer: Self-pay | Admitting: Family Medicine

## 2020-02-13 DIAGNOSIS — I5020 Unspecified systolic (congestive) heart failure: Secondary | ICD-10-CM | POA: Diagnosis not present

## 2020-02-13 DIAGNOSIS — I426 Alcoholic cardiomyopathy: Secondary | ICD-10-CM | POA: Diagnosis not present

## 2020-02-13 DIAGNOSIS — F1010 Alcohol abuse, uncomplicated: Secondary | ICD-10-CM | POA: Diagnosis not present

## 2020-02-13 DIAGNOSIS — Z720 Tobacco use: Secondary | ICD-10-CM | POA: Diagnosis not present

## 2020-02-27 DIAGNOSIS — I5020 Unspecified systolic (congestive) heart failure: Secondary | ICD-10-CM | POA: Diagnosis not present

## 2020-02-27 DIAGNOSIS — I426 Alcoholic cardiomyopathy: Secondary | ICD-10-CM | POA: Diagnosis not present

## 2020-03-19 ENCOUNTER — Emergency Department: Payer: BC Managed Care – PPO

## 2020-03-19 ENCOUNTER — Other Ambulatory Visit: Payer: Self-pay

## 2020-03-19 ENCOUNTER — Observation Stay
Admission: EM | Admit: 2020-03-19 | Discharge: 2020-03-20 | Disposition: A | Discharge status: Home / Self Care | Payer: BC Managed Care – PPO | Attending: Internal Medicine | Admitting: Internal Medicine

## 2020-03-19 DIAGNOSIS — J9811 Atelectasis: Secondary | ICD-10-CM | POA: Diagnosis not present

## 2020-03-19 DIAGNOSIS — I200 Unstable angina: Principal | ICD-10-CM

## 2020-03-19 DIAGNOSIS — R197 Diarrhea, unspecified: Secondary | ICD-10-CM | POA: Diagnosis present

## 2020-03-19 DIAGNOSIS — Z20822 Contact with and (suspected) exposure to covid-19: Secondary | ICD-10-CM | POA: Diagnosis not present

## 2020-03-19 DIAGNOSIS — R7989 Other specified abnormal findings of blood chemistry: Secondary | ICD-10-CM | POA: Diagnosis not present

## 2020-03-19 DIAGNOSIS — Z79899 Other long term (current) drug therapy: Secondary | ICD-10-CM | POA: Insufficient documentation

## 2020-03-19 DIAGNOSIS — K760 Fatty (change of) liver, not elsewhere classified: Secondary | ICD-10-CM | POA: Diagnosis not present

## 2020-03-19 DIAGNOSIS — I110 Hypertensive heart disease with heart failure: Secondary | ICD-10-CM | POA: Diagnosis not present

## 2020-03-19 DIAGNOSIS — K921 Melena: Secondary | ICD-10-CM | POA: Diagnosis present

## 2020-03-19 DIAGNOSIS — I509 Heart failure, unspecified: Secondary | ICD-10-CM | POA: Insufficient documentation

## 2020-03-19 DIAGNOSIS — F17210 Nicotine dependence, cigarettes, uncomplicated: Secondary | ICD-10-CM | POA: Insufficient documentation

## 2020-03-19 DIAGNOSIS — R519 Headache, unspecified: Secondary | ICD-10-CM | POA: Diagnosis not present

## 2020-03-19 DIAGNOSIS — R079 Chest pain, unspecified: Secondary | ICD-10-CM | POA: Diagnosis not present

## 2020-03-19 DIAGNOSIS — I160 Hypertensive urgency: Secondary | ICD-10-CM | POA: Diagnosis not present

## 2020-03-19 DIAGNOSIS — E876 Hypokalemia: Secondary | ICD-10-CM | POA: Diagnosis present

## 2020-03-19 DIAGNOSIS — F1010 Alcohol abuse, uncomplicated: Secondary | ICD-10-CM | POA: Diagnosis not present

## 2020-03-19 DIAGNOSIS — Z720 Tobacco use: Secondary | ICD-10-CM | POA: Diagnosis present

## 2020-03-19 DIAGNOSIS — M47819 Spondylosis without myelopathy or radiculopathy, site unspecified: Secondary | ICD-10-CM | POA: Diagnosis not present

## 2020-03-19 DIAGNOSIS — I161 Hypertensive emergency: Secondary | ICD-10-CM

## 2020-03-19 DIAGNOSIS — R109 Unspecified abdominal pain: Secondary | ICD-10-CM | POA: Diagnosis not present

## 2020-03-19 LAB — COMPREHENSIVE METABOLIC PANEL
ALT: 48 U/L — ABNORMAL HIGH (ref 0–44)
AST: 50 U/L — ABNORMAL HIGH (ref 15–41)
Albumin: 4.6 g/dL (ref 3.5–5.0)
Alkaline Phosphatase: 52 U/L (ref 38–126)
Anion gap: 17 — ABNORMAL HIGH (ref 5–15)
BUN: 16 mg/dL (ref 6–20)
CO2: 17 mmol/L — ABNORMAL LOW (ref 22–32)
Calcium: 9.2 mg/dL (ref 8.9–10.3)
Chloride: 100 mmol/L (ref 98–111)
Creatinine, Ser: 0.89 mg/dL (ref 0.61–1.24)
GFR, Estimated: >60 mL/min (ref >60)
Glucose, Bld: 89 mg/dL (ref 70–99)
Potassium: 3.1 mmol/L — ABNORMAL LOW (ref 3.5–5.1)
Sodium: 134 mmol/L — ABNORMAL LOW (ref 135–145)
Total Bilirubin: 1.4 mg/dL — ABNORMAL HIGH (ref 0.3–1.2)
Total Protein: 7.7 g/dL (ref 6.5–8.1)

## 2020-03-19 LAB — CBC WITH DIFFERENTIAL/PLATELET
Abs Immature Granulocytes: 0.01 10*3/uL (ref 0.00–0.07)
Basophils Absolute: 0.1 10*3/uL (ref 0.0–0.1)
Basophils Relative: 2 %
Eosinophils Absolute: 0.1 10*3/uL (ref 0.0–0.5)
Eosinophils Relative: 1 %
HCT: 39.7 % (ref 39.0–52.0)
Hemoglobin: 13.3 g/dL (ref 13.0–17.0)
Immature Granulocytes: 0 %
Lymphocytes Relative: 52 %
Lymphs Abs: 3.4 10*3/uL (ref 0.7–4.0)
MCH: 30.2 pg (ref 26.0–34.0)
MCHC: 33.5 g/dL (ref 30.0–36.0)
MCV: 90 fL (ref 80.0–100.0)
Monocytes Absolute: 0.7 10*3/uL (ref 0.1–1.0)
Monocytes Relative: 10 %
Neutro Abs: 2.3 10*3/uL (ref 1.7–7.7)
Neutrophils Relative %: 35 %
Platelets: 173 10*3/uL (ref 150–400)
RBC: 4.41 MIL/uL (ref 4.22–5.81)
RDW: 12.7 % (ref 11.5–15.5)
WBC: 6.6 10*3/uL (ref 4.0–10.5)
nRBC: 0 % (ref 0.0–0.2)

## 2020-03-19 LAB — ETHANOL: Alcohol, Ethyl (B): <10 mg/dL (ref <10)

## 2020-03-19 LAB — TROPONIN I (HIGH SENSITIVITY): Troponin I (High Sensitivity): 7 ng/L (ref <18)

## 2020-03-19 LAB — BRAIN NATRIURETIC PEPTIDE: B Natriuretic Peptide: 22.4 pg/mL (ref 0.0–100.0)

## 2020-03-19 MED ORDER — MORPHINE SULFATE (PF) 4 MG/ML IV SOLN
8.0000 mg | Freq: Once | INTRAVENOUS | Status: AC
Start: 2020-03-19 — End: 2020-03-20
  Administered 2020-03-20: 4 mg via INTRAVENOUS
  Filled 2020-03-19: qty 2

## 2020-03-19 MED ORDER — HEPARIN (PORCINE) 25000 UT/250ML-% IV SOLN
900.0000 [IU]/h | INTRAVENOUS | Status: DC
  Administered 2020-03-20: 900 [IU]/h via INTRAVENOUS
  Filled 2020-03-19 (×2): qty 250

## 2020-03-19 MED ORDER — NITROGLYCERIN 0.4 MG SL SUBL
0.4000 mg | SUBLINGUAL_TABLET | SUBLINGUAL | Status: DC | PRN
  Administered 2020-03-19: 0.4 mg via SUBLINGUAL
  Filled 2020-03-19: qty 1

## 2020-03-19 MED ORDER — HEPARIN BOLUS VIA INFUSION
4000.0000 [IU] | Freq: Once | INTRAVENOUS | Status: AC
  Administered 2020-03-20: 4000 [IU] via INTRAVENOUS
  Filled 2020-03-19: qty 4000

## 2020-03-19 MED ORDER — IOHEXOL 350 MG/ML SOLN
100.0000 mL | Freq: Once | INTRAVENOUS | Status: AC | PRN
  Administered 2020-03-19: 100 mL via INTRAVENOUS

## 2020-03-19 MED ORDER — ONDANSETRON HCL 4 MG/2ML IJ SOLN
4.0000 mg | Freq: Once | INTRAMUSCULAR | Status: AC
  Administered 2020-03-19: 4 mg via INTRAVENOUS
  Filled 2020-03-19: qty 2

## 2020-03-19 MED ORDER — ASPIRIN 81 MG PO CHEW
324.0000 mg | CHEWABLE_TABLET | Freq: Once | ORAL | Status: AC
  Administered 2020-03-20: 324 mg via ORAL
  Filled 2020-03-19: qty 4

## 2020-03-19 MED ORDER — MORPHINE SULFATE (PF) 4 MG/ML IV SOLN
6.0000 mg | Freq: Once | INTRAVENOUS | Status: AC
Start: 2020-03-19 — End: 2020-03-19
  Administered 2020-03-19: 6 mg via INTRAVENOUS
  Filled 2020-03-19: qty 2

## 2020-03-19 NOTE — H&P (Incomplete)
Joshua Mayo XLK:[id number] DOB: [birth year] DOA: 03/19/2020     PCP: Tamsen Roers, PA-C   Outpatient Specialists:   CARDS: Dr. Lady Gary NEphrology: *  Dr. NEurology *   Dr. Pulmonary *  Dr.  Oncology * Dr. Sandria Manly* Dr.  Deboraha Sprang, [facility]) Urology Dr. *  Patient arrived to ER on 03/19/20 at 2057 Referred by Attending Concha Se, MD   Patient coming from: home Lives alone,   *** With family From facility ***  Chief Complaint:   Chief Complaint  Patient presents with  . Chest Pain    HPI: Joshua Mayo is a 45 y.o. 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 Started at rest severe CP, BP up to 180    Infectious risk factors:  Reports*** fever, shortness of breath, dry cough, chest pain, Sore throat, URI symptoms, anosmia/change in taste, N/V/Diarrhea/abdominal pain,  Body aches, severe fatigue Reports known sick contacts, known COVID 19 exposure, inability to completely isolate   ***KNOWN COVID POSITIVE   Has ***NOt been vaccinated against COVID **and boosted   Initial COVID TEST  NEGATIVE**** POSITIVE,  ***in house  PCR testing  Pending  Lab Results  Component Value Date   SARSCOV2NAA NEGATIVE 05/16/2018   SARSCOV2NAA NEGATIVE 05/15/2018     Regarding pertinent Chronic problems: ***  ****Hyperlipidemia - *on statins Lipid Panel     Component Value Date/Time   CHOL 239 (H) 05/16/2018 0359   CHOL 232 (H) 01/12/2016 1503   TRIG 95 05/16/2018 0359   HDL 100 05/16/2018 0359   HDL 77 01/12/2016 1503   CHOLHDL 2.4 05/16/2018 0359   VLDL 19 05/16/2018 0359   LDLCALC 120 (H) 05/16/2018 0359   LDLCALC 144 (H) 01/12/2016 1503   LABVLDL 11 01/12/2016 1503    ***HTN on   ***chronic CHF diastolic/systolic/ combined - last echo***  *** CAD  - On Aspirin, statin, betablocker, Plavix                 - *followed by  cardiology                - last cardiac cath  The 10-year ASCVD risk score Denman George DC Jr., et al., 2013) is: 14.9%   Values used to calculate the score:     Age: 39 years     Sex: Male     Is Non-Hispanic African American: Yes     Diabetic: No     Tobacco smoker: Yes     Systolic Blood Pressure: 156 mmHg     Is BP treated: Yes     HDL Cholesterol: 100 mg/dL     Total Cholesterol: 239 mg/dL    ***DM 2 -  Lab Results  Component Value Date   HGBA1C 4.6 (L) 05/16/2018   ****on insulin, PO meds only, diet controlled  ***Hypothyroidism:  Lab Results  Component Value Date   TSH 0.800 01/12/2016   on synthroid  *** Morbid obesity-   BMI Readings from Last 1 Encounters:  03/19/20 25.84 kg/m     *** Asthma -well *** controlled on home inhalers/ nebs f                        ***last no prior***admission  ***  No ***history of intubation  *** COPD - not **followed by pulmonology *** not  on baseline oxygen  *L,    *** OSA -on nocturnal oxygen, *CPAP, *noncompliant with CPAP  *** Hx of CVA - *with/out residual deficits on Aspirin 81 mg, 325, Plavix  ***A. Fib -  - CHA2DS2 vas score **** CHA2DS2/VAS Stroke Risk Points      N/A >= 2 Points: High Risk  1 - 1.99 Points: Medium Risk  0 Points: Low Risk    Last Change: N/A      This score determines the patient's risk of having a stroke if the  patient has atrial fibrillation.      This score is not applicable to this patient. Components are not  calculated.     current  on anticoagulation with ****Coumadin  ***Xarelto,* Eliquis,  *** Not on anticoagulation secondary to Risk of Falls, *** recurrent bleeding         -  Rate control:  Currently controlled with ***Toprolol,  *Metoprolol,* Diltiazem, *Coreg          - Rhythm control: *** amiodarone, *flecainide  ***Hx of DVT/PE on - anticoagulation with ****Coumadin  ***Xarelto,* Eliquis,   ***CKD stage III*- baseline Cr **** Estimated Creatinine Clearance:  97 mL/min (by C-G formula based on SCr of 0.89 mg/dL).  Lab Results  Component Value Date   CREATININE 0.89 03/19/2020   CREATININE 1.30 (H) 08/16/2018   CREATININE 0.75 (L) 05/23/2018     **** Liver disease Computed MELD-Na score unavailable. Necessary lab results were not found in the last year. Computed MELD score unavailable. Necessary lab results were not found in the last year.   ***BPH - on Flomax, Proscar    *** Dementia - on Aricept** Nemenda  *** Chronic anemia - baseline hg Hemoglobin & Hematocrit  Recent Labs    03/19/20 2108  HGB 13.3     While in ER: T wave inversion no change on repeat Gave nitro BP dropped to 140 and he felt very bad Started on heparin and Aspirin      ED Triage Vitals  Enc Vitals Group     BP 03/19/20 2104 (!) 176/129     Pulse Rate 03/19/20 2104 96     Resp 03/19/20 2104 20     Temp 03/19/20 2104 98.4 F (36.9 C)     Temp Source 03/19/20 2104 Oral     SpO2 03/19/20 2104 95 %     Weight 03/19/20 2102 165 lb (74.8 kg)     Height 03/19/20 2102  (1.702 m)     Head Circumference --      Peak Flow --      Pain Score 03/19/20 2102 4     Pain Loc --      Pain Edu? --      Excl. in GC? --   TMAX(24)@     _________________________________________ Significant initial  Findings: Abnormal Labs Reviewed  COMPREHENSIVE METABOLIC PANEL - Abnormal; Notable for the following components:      Result Value   Sodium 134 (*)    Potassium 3.1 (*)    CO2 17 (*)    AST 50 (*)    ALT 48 (*)    Total Bilirubin 1.4 (*)    Anion gap 17 (*)    All other components within normal limits   ____________________________________________ Ordered CT HEAD *** NON acute  CXR - ***NON acute  CTabd/pelvis - ***nonacute  CTA chest - ***nonacute, no  PE, * no evidence of infiltrate   _________________________ Troponin ***ordered ECG: Ordered Personally reviewed by me showing: HR : *** Rhythm: *NSR, Sinus tachycardia * A.fib. W RVR, RBBB,  LBBB, Paced Ischemic changes*nonspecific changes, no evidence of ischemic changes QTC* ____________________ This patient meets SIRS Criteria and may be septic. SIRS = Systemic Inflammatory Response Syndrome  Order a lactic acid level if needed AND/OR Initiate the sepsis protocol with the attached order set OR Click "Treating Associated Infection or Illness" if the patient is being treated for an infection that is a known cause of these abnormalities     The recent clinical data is shown below. Vitals:   03/19/20 2230 03/19/20 2248 03/19/20 2300 03/19/20 2330  BP: (!) 142/102 (!) 168/115 (!) 165/114 (!) 156/102  Pulse: 65 79 68 71  Resp: (!) Temp:      TempSrc:      SpO2: 98% 100% 98% 98%  Weight:      Height:            WBC     Component Value Date/Time   WBC 6.6 03/19/2020 2108   LYMPHSABS 3.4 03/19/2020 2108   LYMPHSABS 2.8 05/23/2018 1448   MONOABS 0.7 03/19/2020 2108   EOSABS 0.1 03/19/2020 2108   EOSABS 0.2 05/23/2018 1448   BASOSABS 0.1 03/19/2020 2108   BASOSABS 0.1 05/23/2018 1448        Lactic Acid, Venous    Component Value Date/Time   LATICACIDVEN 4.5 (HH) 05/15/2018 1844     Procalcitonin *** Ordered Lactic Acid, Venous    Component Value Date/Time   LATICACIDVEN 4.5 (HH) 05/15/2018 1844     Procalcitonin *** Ordered   UA *** no evidence of UTI  ***Pending ***not ordered   Urine analysis:    Component Value Date/Time   COLORURINE YELLOW (A) 05/18/2018 1104   APPEARANCEUR CLEAR (A) 05/18/2018 1104   LABSPEC 1.020 05/18/2018 1104   PHURINE 6.0 05/18/2018 1104   GLUCOSEU NEGATIVE 05/18/2018 1104   HGBUR NEGATIVE 05/18/2018 1104   BILIRUBINUR NEGATIVE 05/18/2018 1104   BILIRUBINUR neg 11/01/2017 1349   KETONESUR NEGATIVE 05/18/2018 1104   PROTEINUR NEGATIVE 05/18/2018 1104   UROBILINOGEN 0.2 11/01/2017 1349   NITRITE NEGATIVE 05/18/2018 1104   LEUKOCYTESUR NEGATIVE 05/18/2018 1104    Results for orders placed or  performed during the hospital encounter of 05/15/18  SARS Coronavirus 2 (CEPHEID- Performed in [facility] hospital lab), Hosp Order     Status: None   Collection Time: 05/15/18  4:48 PM   Specimen: Nasopharyngeal Swab  Result Value Ref Range Status   SARS Coronavirus 2 NEGATIVE NEGATIVE Final    Comment: (NOTE) If result is NEGATIVE SARS-CoV-2 target nucleic acids are NOT DETECTED. The SARS-CoV-2 RNA is generally detectable in upper and lower  respiratory specimens during the acute phase of infection. The lowest  concentration of SARS-CoV-2 viral copies this assay can detect is 250  copies / mL. A negative result does not preclude SARS-CoV-2 infection  and should not be used as the sole basis for treatment or other  patient management decisions.  A negative result may occur with  improper specimen collection / handling, submission of specimen other  than nasopharyngeal swab, presence of viral mutation(s) within the  areas targeted by this assay, and inadequate number of viral copies  (<250 copies / mL). A negative result must be combined with clinical  observations, patient history, and epidemiological information. If result is POSITIVE SARS-CoV-2 target nucleic  acids are DETECTED. The SARS-CoV-2 RNA is generally detectable in upper and lower  respiratory specimens dur ing the acute phase of infection.  Positive  results are indicative of active infection with SARS-CoV-2.  Clinical  correlation with patient history and other diagnostic information is  necessary to determine patient infection status.  Positive results do  not rule out bacterial infection or co-infection with other viruses. If result is PRESUMPTIVE POSTIVE SARS-CoV-2 nucleic acids MAY BE PRESENT.   A presumptive positive result was obtained on the submitted specimen  and confirmed on repeat testing.  While 2019 novel coronavirus  (SARS-CoV-2) nucleic acids may be present in the submitted sample  additional  confirmatory testing may be necessary for epidemiological  and / or clinical management purposes  to differentiate between  SARS-CoV-2 and other Sarbecovirus currently known to infect humans.  If clinically indicated additional testing with an alternate test  methodology [phone]) is advised. The SARS-CoV-2 RNA is generally  detectable in upper and lower respiratory sp ecimens during the acute  phase of infection. The expected result is Negative. Fact Sheet for Patients:  BoilerBrush.com.cy Fact Sheet for Healthcare Providers: https://pope.com/ This test is not yet approved or cleared by the [place] FDA and has been authorized for detection and/or diagnosis of SARS-CoV-2 by FDA under an Emergency Use Authorization (EUA).  This EUA will remain in effect (meaning this test can be used) for the duration of the COVID-19 declaration under Section 564(b)(1) of the Act, 21 U.S.C. section 360bbb-3(b)(1), unless the authorization is terminated or revoked sooner. Performed at [facility], [address] [place], [place] [address]   Blood culture (routine x 2)     Status: None   Collection Time: 05/15/18  4:48 PM   Specimen: BLOOD  Result Value Ref Range Status   Specimen Description BLOOD RIGHT ANTECUBITAL  Final   Special Requests   Final    BOTTLES DRAWN AEROBIC AND ANAEROBIC Blood Culture adequate volume   Culture   Final    NO GROWTH 5 DAYS Performed at [facility], [address] [place], [place] [address]    Report Status 05/20/2018 FINAL  Final  Blood culture (routine x 2)     Status: None   Collection Time: 05/15/18  4:48 PM   Specimen: BLOOD  Result Value Ref Range Status   Specimen Description BLOOD LEFT ANTECUBITAL  Final   Special Requests   Final    BOTTLES DRAWN AEROBIC AND ANAEROBIC Blood Culture results may not be optimal due to an excessive volume of blood received in culture bottles   Culture    Final    NO GROWTH 5 DAYS Performed at [facility], [address] [place], [place] [address]    Report Status 05/20/2018 FINAL  Final  MRSA PCR Screening     Status: None   Collection Time: 05/15/18  8:55 PM   Specimen: Nasal Mucosa; Nasopharyngeal  Result Value Ref Range Status   MRSA by PCR NEGATIVE NEGATIVE Final    Comment:        The GeneXpert MRSA Assay (FDA approved for NASAL specimens only), is one component of a comprehensive MRSA colonization surveillance program. It is not intended to diagnose MRSA infection nor to guide or monitor treatment for MRSA infections. Performed at [facility], [address] [place], [place] [address]   Expectorated sputum assessment w rflx to resp cult     Status: None   Collection Time: 05/16/18  6:00 AM  Specimen: Sputum  Result Value Ref Range Status   Specimen Description SPUTUM  Final   Special Requests NONE  Final   Sputum evaluation   Final    THIS SPECIMEN IS ACCEPTABLE FOR SPUTUM CULTURE Performed at [facility], [address] [place], [place] [address]    Report Status 05/16/2018 FINAL  Final  Culture, respiratory     Status: None   Collection Time: 05/16/18  6:00 AM   Specimen: SPU  Result Value Ref Range Status   Specimen Description   Final    SPUTUM Performed at [facility], [address], [place], [place] [address]    Special Requests   Final    NONE Reflexed from [id number] Performed at [facility], [address] [place], [place] [address]    Gram Stain   Final    NO WBC SEEN FEW GRAM NEGATIVE COCCOBACILLI FEW GRAM POSITIVE COCCI IN PAIRS RARE GRAM POSITIVE RODS    Culture   Final    MODERATE Consistent with normal respiratory flora. Performed at [facility], [address] [place], [place] [address]    Report Status 05/18/2018 FINAL  Final  SARS Coronavirus 2 [facility] order, Performed in [facility] hospital lab)     Status: None    Collection Time: 05/16/18  8:25 AM   Specimen: Nasopharyngeal Swab  Result Value Ref Range Status   SARS Coronavirus 2 NEGATIVE NEGATIVE Final    Comment: (NOTE) If result is NEGATIVE SARS-CoV-2 target nucleic acids are NOT DETECTED. The SARS-CoV-2 RNA is generally detectable in upper and lower  respiratory specimens during the acute phase of infection. The lowest  concentration of SARS-CoV-2 viral copies this assay can detect is 250  copies / mL. A negative result does not preclude SARS-CoV-2 infection  and should not be used as the sole basis for treatment or other  patient management decisions.  A negative result may occur with  improper specimen collection / handling, submission of specimen other  than nasopharyngeal swab, presence of viral mutation(s) within the  areas targeted by this assay, and inadequate number of viral copies  (<250 copies / mL). A negative result must be combined with clinical  observations, patient history, and epidemiological information. If result is POSITIVE SARS-CoV-2 target nucleic acids are DETECTED. The SARS-CoV-2 RNA is generally detectable in upper and lower  respiratory specimens dur ing the acute phase of infection.  Positive  results are indicative of active infection with SARS-CoV-2.  Clinical  correlation with patient history and other diagnostic information is  necessary to determine patient infection status.  Positive results do  not rule out bacterial infection or co-infection with other viruses. If result is PRESUMPTIVE POSTIVE SARS-CoV-2 nucleic acids MAY BE PRESENT.   A presumptive positive result was obtained on the submitted specimen  and confirmed on repeat testing.  While 2019 novel coronavirus  (SARS-CoV-2) nucleic acids may be present in the submitted sample  additional confirmatory testing may be necessary for epidemiological  and / or clinical management purposes  to differentiate between  SARS-CoV-2 and other Sarbecovirus  currently known to infect humans.  If clinically indicated additional testing with an alternate test  methodology [phone]) is advised. The SARS-CoV-2 RNA is generally  detectable in upper and lower respiratory sp ecimens during the acute  phase of infection. The expected result is Negative. Fact Sheet for Patients:  BoilerBrush.com.cy Fact Sheet for Healthcare Providers: https://pope.com/ This test is not yet approved or cleared by  the [employer] and has been authorized for detection and/or diagnosis of SARS-CoV-2 by FDA under an Emergency Use Authorization (EUA).  This EUA will remain in effect (meaning this test can be used) for the duration of the COVID-19 declaration under Section 564(b)(1) of the Act, 21 U.S.C. section 360bbb-3(b)(1), unless the authorization is terminated or revoked sooner. Performed at [facility], [address], [place], [place] [address]      _______________________________________________________ ER Provider Called:     DrMarland Kitchen  They Recommend admit to medicine *** Will see in AM  ***SEEN in ER _______________________________________________ Hospitalist was called for admission for ***  The following Work up has been ordered so far:  Orders Placed This Encounter  Procedures  . Critical Care  . 1-3 Lead EKG Interpretation  . Resp Panel by RT-PCR (Flu A&B, Covid) Nasopharyngeal Swab  . DG Chest Portable 1 View  . CT Head Wo Contrast  . CT Angio Chest/Abd/Pel for Dissection W and/or Wo Contrast  . CBC with Differential  . Comprehensive metabolic panel  . Brain natriuretic peptide  . Ethanol  . Urine Drug Screen, Qualitative ([facility] only)  . Protime-INR  . APTT  . Cardiac monitoring  . Consult to hospitalist  . heparin per pharmacy consult  . Pulse oximetry, continuous  . EKG 12-Lead  . ED EKG  . EKG 12-Lead  . EKG 12-Lead  . Saline lock IV      Following Medications were  ordered in ER: Medications  morphine 4 MG/ML injection 8 mg (has no administration in time range)  nitroGLYCERIN (NITROSTAT) SL tablet 0.4 mg (0.4 mg Sublingual Given 03/19/20 2223)  aspirin chewable tablet 324 mg (has no administration in time range)  heparin bolus via infusion 4,000 Units (has no administration in time range)  heparin ADULT infusion 100 units/mL (25000 units/271mL) (has no administration in time range)  morphine 4 MG/ML injection 6 mg (6 mg Intravenous Given 03/19/20 2157)  ondansetron (ZOFRAN) injection 4 mg (4 mg Intravenous Given 03/19/20 2155)  iohexol (OMNIPAQUE) 350 MG/ML injection 100 mL (100 mLs Intravenous Contrast Given 03/19/20 2309)        Consult Orders  (From admission, onward)         Start     Ordered   03/19/20 2342  Consult to hospitalist  Once       Provider:  (Not yet assigned)  Question Answer Comment  Place call to: [phone]   Reason for Consult Admit      03/19/20 2342            OTHER Significant initial  Findings:  labs showing:    Recent Labs  Lab 03/19/20 2108  NA 134*  K 3.1*  CO2 17*  GLUCOSE 89  BUN 16  CREATININE 0.89  CALCIUM 9.2    Cr  * stable,  Up from baseline see below Lab Results  Component Value Date   CREATININE 0.89 03/19/2020   CREATININE 1.30 (H) 08/16/2018   CREATININE 0.75 (L) 05/23/2018    Recent Labs  Lab 03/19/20 2108  AST 50*  ALT 48*  ALKPHOS 52  BILITOT 1.4*  PROT 7.7  ALBUMIN 4.6   Lab Results  Component Value Date   CALCIUM 9.2 03/19/2020          Plt: Lab Results  Component Value Date   PLT 173 03/19/2020       COVID-19 Labs  No results for input(s): DDIMER, FERRITIN, LDH, CRP in the last 72 hours.  Lab Results  Component Value Date   SARSCOV2NAA NEGATIVE 05/16/2018   SARSCOV2NAA NEGATIVE 05/15/2018     Arterial ***Venous  Blood Gas result:  pH *** pCO2 ***; pO2 ***;     %O2 Sat ***.  ABG No results found for: PHART, PCO2ART, PO2ART, HCO3, TCO2,  ACIDBASEDEF, O2SAT       Recent Labs  Lab 03/19/20 2108  WBC 6.6  NEUTROABS 2.3  HGB 13.3  HCT 39.7  MCV 90.0  PLT 173    HG/HCT * stable,  Down *Up from baseline see below    Component Value Date/Time   HGB 13.3 03/19/2020 2108   HGB 13.5 05/23/2018 1448   HCT 39.7 03/19/2020 2108   HCT 40.7 05/23/2018 1448   MCV 90.0 03/19/2020 2108   MCV 88 05/23/2018 1448      No results for input(s): LIPASE, AMYLASE in the last 168 hours. No results for input(s): AMMONIA in the last 168 hours.   Cardiac Panel (last 3 results) No results for input(s): CKTOTAL, CKMB, TROPONINI, RELINDX in the last 72 hours.   BNP (last 3 results) Recent Labs    03/19/20 2108  BNP 22.4      DM  labs:  HbA1C: No results for input(s): HGBA1C in the last 8760 hours.     CBG (last 3)  No results for input(s): GLUCAP in the last 72 hours.        Cultures:    Component Value Date/Time   SDES SPUTUM 05/16/2018 0600   SDES  05/16/2018 0600    SPUTUM Performed at [facility], [address] [place] [place], [place] [address]    SPECREQUEST NONE 05/16/2018 0600   SPECREQUEST  05/16/2018 0600    NONE Reflexed from [id number] Performed at [facility], [address] [place], [place] [address]    CULT  05/16/2018 0600    MODERATE Consistent with normal respiratory flora. Performed at [facility], [address], [place], [place] [address]    REPTSTATUS 05/16/2018 FINAL 05/16/2018 0600   REPTSTATUS 05/18/2018 FINAL 05/16/2018 0600     Radiological Exams on Admission: CT Head Wo Contrast  Result Date: 03/19/2020 CLINICAL DATA:  Headache, intracranial hemorrhage suspected EXAM: CT HEAD WITHOUT CONTRAST TECHNIQUE: Contiguous axial images were obtained from the base of the skull through the vertex without intravenous contrast. COMPARISON:  None. FINDINGS: Brain: Minimal encephalomalacia in the right occipital parietal lobe, series 2, image 19. No evidence of acute  infarct. No intracranial hemorrhage. No hydrocephalus, midline shift, mass lesion/mass effect or extra-axial collection. Vascular: No hyperdense vessel or unexpected calcification. Skull: Normal. Negative for fracture or focal lesion. Sinuses/Orbits: Paranasal sinuses and mastoid air cells are clear. The visualized orbits are unremarkable. Other: None. IMPRESSION: 1. No acute intracranial abnormality. 2. Minimal encephalomalacia in the right occipital parietal lobe. Electronically Signed   By: Narda Rutherford M.D.   On: 03/19/2020 23:24   DG Chest Portable 1 View  Result Date: 03/19/2020 CLINICAL DATA:  Chest pain EXAM: PORTABLE CHEST 1 VIEW COMPARISON:  05/18/2018 FINDINGS: No focal opacity or pleural effusion. Cardiac size upper normal. No pneumothorax IMPRESSION: No active disease. Electronically Signed   By: Jasmine Pang M.D.   On: 03/19/2020 21:53   CT Angio Chest/Abd/Pel for Dissection W and/or Wo Contrast  Result Date: 03/19/2020 CLINICAL DATA:  Abdominal pain, aortic dissection suspected. EXAM: CT ANGIOGRAPHY CHEST, ABDOMEN AND PELVIS TECHNIQUE: Non-contrast CT of the chest was initially obtained. Multidetector CT imaging through the chest, abdomen  and pelvis was performed using the standard protocol during bolus administration of intravenous contrast. Multiplanar reconstructed images and MIPs were obtained and reviewed to evaluate the vascular anatomy. CONTRAST:  OMNIPAQUE IOHEXOL 350 MG/ML SOLN COMPARISON:  Ultrasound abdomen 05/18/2018, CT abdomen pelvis 11/02/2007 FINDINGS: CTA CHEST FINDINGS Cardiovascular: Preferential opacification of the thoracic aorta. No evidence of thoracic aortic aneurysm or dissection. Normal heart size. No pericardial effusion. The main pulmonary artery is normal in caliber. No central pulmonary embolus. Mediastinum/Nodes: No enlarged mediastinal, hilar, or axillary lymph nodes. Thyroid gland, trachea, and esophagus demonstrate no significant findings.  Lungs/Pleura: Bilateral lower lobe subsegmental atelectasis. Right middle lobe linear atelectasis. No focal consolidation. No pulmonary nodule. No pulmonary mass. No pleural effusion. No pneumothorax. Musculoskeletal: No chest wall abnormality. No suspicious lytic or blastic osseous lesions. No acute displaced fracture. CTA ABDOMEN AND PELVIS FINDINGS VASCULAR Aorta: Normal caliber aorta without aneurysm, dissection, vasculitis or significant stenosis. Celiac: Patent without evidence of aneurysm, dissection, vasculitis or significant stenosis. SMA: Patent without evidence of aneurysm, dissection, vasculitis or significant stenosis. Renals: Both renal arteries are patent without evidence of aneurysm, dissection, vasculitis, fibromuscular dysplasia or significant stenosis. IMA: Patent without evidence of aneurysm, dissection, vasculitis or significant stenosis. Inflow: Patent without evidence of aneurysm, dissection, vasculitis or significant stenosis. Veins: No obvious venous abnormality within the limitations of this arterial phase study. NON-VASCULAR Hepatobiliary: The hepatic parenchyma is diffusely hypodense compared to the splenic parenchyma consistent with fatty infiltration. No focal liver abnormality. No gallstones, gallbladder wall thickening, or pericholecystic fluid. No biliary dilatation. Pancreas: No focal lesion. Normal pancreatic contour. No surrounding inflammatory changes. No main pancreatic ductal dilatation. Spleen: Normal in size without focal abnormality. Adrenals/Urinary Tract: No adrenal nodule bilaterally. Bilateral kidneys enhance symmetrically. No hydronephrosis. No hydroureter. The urinary bladder is unremarkable. Stomach/Bowel: Stomach is within normal limits. No evidence of bowel wall thickening or dilatation. Appendix appears normal. Lymphatic: No lymphadenopathy Reproductive: Prostate is unremarkable. Other: No intraperitoneal free fluid. No intraperitoneal free gas. No organized fluid  collection. Musculoskeletal: No abdominal abnormality. No suspicious lytic or blastic osseous lesions. No acute displaced fracture. Multilevel degenerative changes of the spine. Review of the MIP images confirms the above findings. IMPRESSION: 1. No acute vascular abnormality. 2. Hepatic steatosis. Otherwise no acute intrathoracic, intra-abdominal, intrapelvic abnormality. Electronically Signed   By: Tish Frederickson M.D.   On: 03/19/2020 23:30   _______________________________________________________________________________________________________ Latest  Blood pressure (!) 156/102, pulse 71, temperature 98.4 F (36.9 C), temperature source Oral, resp. rate 19, height 5\' 7"  (1.702 m), weight 74.8 kg, SpO2 98 %.   Review of Systems:    Pertinent positives include: ***  Constitutional:  No weight loss, night sweats, Fevers, chills, fatigue, weight loss  HEENT:  No headaches, Difficulty swallowing,Tooth/dental problems,Sore throat,  No sneezing, itching, ear ache, nasal congestion, post nasal drip,  Cardio-vascular:  No chest pain, Orthopnea, PND, anasarca, dizziness, palpitations.no Bilateral lower extremity swelling  GI:  No heartburn, indigestion, abdominal pain, nausea, vomiting, diarrhea, change in bowel habits, loss of appetite, melena, blood in stool, hematemesis Resp:  no shortness of breath at rest. No dyspnea on exertion, No excess mucus, no productive cough, No non-productive cough, No coughing up of blood.No change in color of mucus.No wheezing. Skin:  no rash or lesions. No jaundice GU:  no dysuria, change in color of urine, no urgency or frequency. No straining to urinate.  No flank pain.  Musculoskeletal:  No joint pain or no joint swelling. No decreased range of motion. No back pain.  Psych:  No change in mood or affect. No depression or anxiety. No memory loss.  Neuro: no localizing neurological complaints, no tingling, no weakness, no double vision, no gait abnormality,  no slurred speech, no confusion  All systems reviewed and apart from HOPI all are negative _______________________________________________________________________________________________ Past Medical History:   Past Medical History:  Diagnosis Date  . CHF (congestive heart failure) (HCC)   . Hypertension       History reviewed. No pertinent surgical history.  Social History:  Ambulatory *** independently cane, walker  wheelchair bound, bed bound     reports that he has been smoking cigarettes. He has a 20.00 pack-year smoking history. He has never used smokeless tobacco. He reports current alcohol use. He reports that he does not use drugs.     Family History: *** Family History  Problem Relation Age of Onset  . Heart disease Father   . Heart disease Paternal Grandfather    ______________________________________________________________________________________________ Allergies: Allergies  Allergen Reactions  . Banana Anaphylaxis     Prior to Admission medications   Medication Sig Start Date End Date Taking? Authorizing Provider  carvedilol (COREG) 12.5 MG tablet Take 1 tablet (12.5 mg total) by mouth 2 (two) times daily with a meal. 05/19/18   Enid Baas, MD  ENTRESTO 24-26 MG Take 1 tablet by mouth 2 (two) times daily. 03/10/20   [provider]  losartan (COZAAR) 50 MG tablet Take 1 tablet (50 mg total) by mouth daily. 05/20/18   Enid Baas, MD  sertraline (ZOLOFT) 50 MG tablet Take 1 tablet (50 mg total) by mouth daily. 12/17/19   Chrismon, Jodell Cipro, PA-C  spironolactone (ALDACTONE) 25 MG tablet Take 50 mg by mouth daily. 03/10/20   [provider]  tadalafil (CIALIS) 5 MG tablet Take 5 mg by mouth daily. 03/10/20   [provider]    ___________________________________________________________________________________________________ Physical Exam: Vitals with BMI 03/19/2020 03/19/2020 03/19/2020  Height - - -  Weight - - -  BMI  - - -  Systolic 156 165 680  Diastolic 102 114 881  Pulse 71 68 79     1. General:  in No ***Acute distress***increased work of breathing ***complaining of severe pain****agitated * Chronically ill *well *cachectic *toxic acutely ill -appearing 2. Psychological: Alert and *** Oriented 3. Head/ENT:   Moist *** Dry Mucous Membranes                          Head Non traumatic, neck supple                          Normal *** Poor Dentition 4. SKIN: normal *** decreased Skin turgor,  Skin clean Dry and intact no rash 5. Heart: Regular rate and rhythm no*** Murmur, no Rub or gallop 6. Lungs: ***Clear to auscultation bilaterally, no wheezes or crackles   7. Abdomen: Soft, ***non-tender, Non distended *** obese ***bowel sounds present 8. Lower extremities: no clubbing, cyanosis, no ***edema 9. Neurologically Grossly intact, moving all 4 extremities equally *** strength 5 out of 5 in all 4 extremities cranial nerves II through XII intact 10. MSK: Normal range of motion    Chart has been reviewed  ______________________________________________________________________________________________  Assessment/Plan  ***  Admitted for ***  Present on Admission: **None**   Other plan as per orders.  DVT prophylaxis:  SCD *** Lovenox   heparin bolus via infusion 4,000 Units Start: 03/20/20 0000  Code Status:    Code Status: Prior FULL CODE *** DNR/DNI ***comfort care as per patient ***family  I had personally discussed CODE STATUS with patient and family* I had spent *min discussing goals of care and CODE STATUS    Family Communication:   Family not at  Bedside  plan of care was discussed on the phone with *** Son, Daughter, Wife, Husband, Sister, Brother , father, mother  Disposition Plan:   *** likely will need placement for rehabilitation                          Back to current facility when stable                            To home once workup is complete and patient is stable   ***Following barriers for discharge:                            Electrolytes corrected                               Anemia corrected                             Pain controlled with PO medications                               Afebrile, white count improving able to transition to PO antibiotics                             Will need to be able to tolerate PO                            Will likely need home health, home O2, set up                           Will need consultants to evaluate patient prior to discharge  ****EXPECT DC tomorrow                    ***Would benefit from PT/OT eval prior to DC  Ordered                   Swallow eval - SLP ordered                   Diabetes care coordinator                   Transition of care consulted                   Nutrition    consulted                  Wound care  consulted                   Palliative care    consulted                   Behavioral health  consulted  Consults called: ***    Admission status:  ED Disposition    None       Obs***  ***  inpatient     I Expect 2 midnight stay secondary to severity of patient's current illness need for inpatient interventions justified by the following: ***hemodynamic instability despite optimal treatment (tachycardia *hypotension * tachypnea *hypoxia, hypercapnia) * Severe lab/radiological/exam abnormalities including:     and extensive comorbidities including: *substance abuse  *Chronic pain *DM2  * CHF * CAD  * COPD/asthma *Morbid Obesity * CKD *dementia *liver disease *history of stroke with residual deficits *  malignancy, * sickle cell disease  . History of amputation . Chronic anticoagulation  That are currently affecting medical management.   I expect  patient to be hospitalized for 2 midnights requiring inpatient medical care.  Patient is at high risk for adverse outcome (such as loss of life or disability) if not treated.  Indication  for inpatient stay as follows:  Severe change from baseline regarding mental status Hemodynamic instability despite maximal medical therapy,  ongoing suicidal ideations,  severe pain requiring acute inpatient management,  inability to maintain oral hydration   persistent chest pain despite medical management Need for operative/procedural  intervention New or worsening hypoxia  Need for IV antibiotics, IV fluids, IV rate controling medications, IV antihypertensives, IV pain medications, IV anticoagulation    Level of care   *** tele  For 12H 24H     medical floor       SDU tele indefinitely please discontinue once patient no longer qualifies COVID-19 Labs    Lab Results  Component Value Date   SARSCOV2NAA NEGATIVE 05/16/2018     Precautions: admitted as *** Covid Negative  ***asymptomatic screening protocol****PUI *** covid positive No active isolations ***If Covid PCR is negative  - please DC precautions - would need additional investigation given very high risk for false native test result   PPE: Used by the provider:   N95***P100  eye Goggles,  Gloves ***gown     Critical***  Patient is critically ill due to  hemodynamic instability * respiratory failure *severe sepsis* ongoing chest pain*  They are at high risk for life/limb threatening clinical deterioration requiring frequent reassessment and modifications of care.  Services provided include examination of the patient, review of relevant ancillary tests, prescription of lifesaving therapies, review of medications and prophylactic therapy.  Total critical care time excluding separately billable procedures: 60*  Minutes.    Nafeesah Lapaglia 03/19/2020, 11:55 PM ***  [facility]     after 2 AM please page floor coverage PA If 7AM-7PM, please contact the day team taking care of the patient using [email or web address]   Patient was evaluated in the context of the global COVID-19 pandemic, which necessitated  consideration that the patient might be at risk for infection with the SARS-CoV-2 virus that causes COVID-19. Institutional protocols and algorithms that pertain to the evaluation of patients at risk for COVID-19 are in a state of rapid change based on information released by regulatory bodies including the CDC and federal and state organizations. These policies and algorithms were followed during the patient's care.

## 2020-03-19 NOTE — ED Provider Notes (Signed)
[facility] Emergency Department Provider Note  ____________________________________________   Event Date/Time   First MD Initiated Contact with Patient 03/19/20 2126     (approximate)  I have reviewed the triage vital signs and the nursing notes.   HISTORY  Chief Complaint Chest Pain    HPI Joshua Mayo is a 45 y.o. male with CHF EF of 35%, hypertension, alcohol abuse who comes in for chest pain.  Patient reports having a lot of life stressors recently.  He went on an alcohol binge for 1 week.  He states that afterward he did not feel like he was withdrawing at all.  He states that he was eating lunch today when he had sudden onset of severe chest pain radiating down into his arms as well as tingling sensation more in the right arm.  His symptoms have been constant, moderate but then getting more severe, nothing makes it better, nothing makes it worse.  Patient did have a prior CTA that showed nonobstructive coronary disease in August 2020.  Patient normally drinks a pint of liquor a day and last drink 2 days ago.    Past Medical History:  Diagnosis Date  . CHF (congestive heart failure) (HCC)   . Hypertension     Patient Active Problem List   Diagnosis Date Noted  . Acute respiratory failure with hypoxia (HCC) 05/15/2018  . Esophageal reflux 01/08/2016  . Internal hemorrhoids without complication 01/08/2016  . Snoring 01/08/2016    History reviewed. No pertinent surgical history.  Prior to Admission medications   Medication Sig Start Date End Date Taking? Authorizing Provider  carvedilol (COREG) 12.5 MG tablet Take 1 tablet (12.5 mg total) by mouth 2 (two) times daily with a meal. 05/19/18   Enid Baas, MD  losartan (COZAAR) 50 MG tablet Take 1 tablet (50 mg total) by mouth daily. 05/20/18   Enid Baas, MD  sertraline (ZOLOFT) 50 MG tablet Take 1 tablet (50 mg total) by mouth daily. 12/17/19   Chrismon, Jodell Cipro, PA-C     Allergies Banana  Family History  Problem Relation Age of Onset  . Heart disease Father   . Heart disease Paternal Grandfather     Social History Social History   Tobacco Use  . Smoking status: Current Every Day Smoker    Packs/day: 1.00    Years: 20.00    Pack years: 20.00    Types: Cigarettes  . Smokeless tobacco: Never Used  Vaping Use  . Vaping Use: Never used  Substance Use Topics  . Alcohol use: Yes    Comment: weekends  . Drug use: No    Types: Cocaine    Comment: previously used cocaine. Pt states he stopped on his own with the help of his current wife       Review of Systems Constitutional: No fever/chills Eyes: No visual changes. ENT: No sore throat. Cardiovascular: Positive chest pain Respiratory: Denies shortness of breath. Gastrointestinal: No abdominal pain.  No nausea, no vomiting.  No diarrhea.  No constipation. Genitourinary: Negative for dysuria. Musculoskeletal: Negative for back pain. Skin: Negative for rash. Neurological: Negative for headaches, focal weakness or numbness. All other ROS negative ____________________________________________   PHYSICAL EXAM:  VITAL SIGNS: ED Triage Vitals  Enc Vitals Group     BP 03/19/20 2104 (!) 176/129     Pulse Rate 03/19/20 2104 96     Resp 03/19/20 2104 20     Temp 03/19/20 2104 98.4 F (36.9 C)  Temp Source 03/19/20 2104 Oral     SpO2 03/19/20 2104 95 %     Weight 03/19/20 2102 165 lb (74.8 kg)     Height 03/19/20 2102 5\' 7"  (1.702 m)     Head Circumference --      Peak Flow --      Pain Score 03/19/20 2102 4     Pain Loc --      Pain Edu? --      Excl. in GC? --     Constitutional: Alert and oriented.  But appears in pain and slightly diaphoretic Eyes: Conjunctivae are normal. EOMI. Head: Atraumatic. Nose: No congestion/rhinnorhea. Mouth/Throat: Mucous membranes are moist.   Neck: No stridor. Trachea Midline. FROM Cardiovascular: Normal rate, regular rhythm. Grossly normal  heart sounds.  Good peripheral circulation. Respiratory: Normal respiratory effort.  No retractions. Lungs CTAB. Gastrointestinal: Soft and nontender. No distention. No abdominal bruits.  Musculoskeletal: No lower extremity tenderness nor edema.  No joint effusions. Neurologic:  Normal speech and language. No gross focal neurologic deficits are appreciated.  Skin:  Skin is warm, dry and intact. No rash noted. Psychiatric: Mood and affect are normal. Speech and behavior are normal. GU: Deferred   ____________________________________________   LABS (all labs ordered are listed, but only abnormal results are displayed)  Labs Reviewed  COMPREHENSIVE METABOLIC PANEL - Abnormal; Notable for the following components:      Result Value   Sodium 134 (*)    Potassium 3.1 (*)    CO2 17 (*)    AST 50 (*)    ALT 48 (*)    Total Bilirubin 1.4 (*)    Anion gap 17 (*)    All other components within normal limits  RESP PANEL BY RT-PCR (FLU A&B, COVID) ARPGX2  CBC WITH DIFFERENTIAL/PLATELET  BRAIN NATRIURETIC PEPTIDE  ETHANOL  URINE DRUG SCREEN, QUALITATIVE ([facility] ONLY)  TROPONIN I (HIGH SENSITIVITY)  TROPONIN I (HIGH SENSITIVITY)   ____________________________________________   ED ECG REPORT I, Concha SeMary E Brysin Towery, the attending physician, personally viewed and interpreted this ECG.  Normal sinus rate of 94, no ST elevation but does have T wave inversions in 2 3 aVF V6, QTC of 490  Repeat EKG is sinus rate of 74, no ST elevation, T wave inversion in lead III, aVF, V3 through V6 ____________________________________________  RADIOLOGY I, Concha SeMary E Nomie Buchberger, personally viewed and evaluated these images (plain radiographs) as part of my medical decision making, as well as reviewing the written report by the radiologist.  ED MD interpretation: No widened mediastinum on x-ray  Official radiology report(s): CT Head Wo Contrast  Result Date: 03/19/2020 CLINICAL DATA:  Headache, intracranial hemorrhage  suspected EXAM: CT HEAD WITHOUT CONTRAST TECHNIQUE: Contiguous axial images were obtained from the base of the skull through the vertex without intravenous contrast. COMPARISON:  None. FINDINGS: Brain: Minimal encephalomalacia in the right occipital parietal lobe, series 2, image 19. No evidence of acute infarct. No intracranial hemorrhage. No hydrocephalus, midline shift, mass lesion/mass effect or extra-axial collection. Vascular: No hyperdense vessel or unexpected calcification. Skull: Normal. Negative for fracture or focal lesion. Sinuses/Orbits: Paranasal sinuses and mastoid air cells are clear. The visualized orbits are unremarkable. Other: None. IMPRESSION: 1. No acute intracranial abnormality. 2. Minimal encephalomalacia in the right occipital parietal lobe. Electronically Signed   By: Narda RutherfordMelanie  Sanford M.D.   On: 03/19/2020 23:24   DG Chest Portable 1 View  Result Date: 03/19/2020 CLINICAL DATA:  Chest pain EXAM: PORTABLE CHEST 1 VIEW COMPARISON:  05/18/2018  FINDINGS: No focal opacity or pleural effusion. Cardiac size upper normal. No pneumothorax IMPRESSION: No active disease. Electronically Signed   By: Jasmine Pang M.D.   On: 03/19/2020 21:53   CT Angio Chest/Abd/Pel for Dissection W and/or Wo Contrast  Result Date: 03/19/2020 CLINICAL DATA:  Abdominal pain, aortic dissection suspected. EXAM: CT ANGIOGRAPHY CHEST, ABDOMEN AND PELVIS TECHNIQUE: Non-contrast CT of the chest was initially obtained. Multidetector CT imaging through the chest, abdomen and pelvis was performed using the standard protocol during bolus administration of intravenous contrast. Multiplanar reconstructed images and MIPs were obtained and reviewed to evaluate the vascular anatomy. CONTRAST:  OMNIPAQUE IOHEXOL 350 MG/ML SOLN COMPARISON:  Ultrasound abdomen 05/18/2018, CT abdomen pelvis 11/02/2007 FINDINGS: CTA CHEST FINDINGS Cardiovascular: Preferential opacification of the thoracic aorta. No evidence of thoracic aortic  aneurysm or dissection. Normal heart size. No pericardial effusion. The main pulmonary artery is normal in caliber. No central pulmonary embolus. Mediastinum/Nodes: No enlarged mediastinal, hilar, or axillary lymph nodes. Thyroid gland, trachea, and esophagus demonstrate no significant findings. Lungs/Pleura: Bilateral lower lobe subsegmental atelectasis. Right middle lobe linear atelectasis. No focal consolidation. No pulmonary nodule. No pulmonary mass. No pleural effusion. No pneumothorax. Musculoskeletal: No chest wall abnormality. No suspicious lytic or blastic osseous lesions. No acute displaced fracture. CTA ABDOMEN AND PELVIS FINDINGS VASCULAR Aorta: Normal caliber aorta without aneurysm, dissection, vasculitis or significant stenosis. Celiac: Patent without evidence of aneurysm, dissection, vasculitis or significant stenosis. SMA: Patent without evidence of aneurysm, dissection, vasculitis or significant stenosis. Renals: Both renal arteries are patent without evidence of aneurysm, dissection, vasculitis, fibromuscular dysplasia or significant stenosis. IMA: Patent without evidence of aneurysm, dissection, vasculitis or significant stenosis. Inflow: Patent without evidence of aneurysm, dissection, vasculitis or significant stenosis. Veins: No obvious venous abnormality within the limitations of this arterial phase study. NON-VASCULAR Hepatobiliary: The hepatic parenchyma is diffusely hypodense compared to the splenic parenchyma consistent with fatty infiltration. No focal liver abnormality. No gallstones, gallbladder wall thickening, or pericholecystic fluid. No biliary dilatation. Pancreas: No focal lesion. Normal pancreatic contour. No surrounding inflammatory changes. No main pancreatic ductal dilatation. Spleen: Normal in size without focal abnormality. Adrenals/Urinary Tract: No adrenal nodule bilaterally. Bilateral kidneys enhance symmetrically. No hydronephrosis. No hydroureter. The urinary bladder  is unremarkable. Stomach/Bowel: Stomach is within normal limits. No evidence of bowel wall thickening or dilatation. Appendix appears normal. Lymphatic: No lymphadenopathy Reproductive: Prostate is unremarkable. Other: No intraperitoneal free fluid. No intraperitoneal free gas. No organized fluid collection. Musculoskeletal: No abdominal abnormality. No suspicious lytic or blastic osseous lesions. No acute displaced fracture. Multilevel degenerative changes of the spine. Review of the MIP images confirms the above findings. IMPRESSION: 1. No acute vascular abnormality. 2. Hepatic steatosis. Otherwise no acute intrathoracic, intra-abdominal, intrapelvic abnormality. Electronically Signed   By: Tish Frederickson M.D.   On: 03/19/2020 23:30    ____________________________________________   PROCEDURES  Procedure(s) performed (including Critical Care):  .Critical Care Performed by: Concha Se, MD Authorized by: Concha Se, MD   Critical care provider statement:    Critical care time (minutes):  45   Critical care was necessary to treat or prevent imminent or life-threatening deterioration of the following conditions:  Cardiac failure   Critical care was time spent personally by me on the following activities:  Discussions with consultants, evaluation of patient's response to treatment, examination of patient, ordering and performing treatments and interventions, ordering and review of laboratory studies, ordering and review of radiographic studies, pulse oximetry, re-evaluation of patient's condition, obtaining history from  patient or surrogate and review of old charts .1-3 Lead EKG Interpretation Performed by: Concha Se, MD Authorized by: Concha Se, MD     Interpretation: normal     ECG rate:  70s    ECG rate assessment: normal     Rhythm: sinus rhythm     Ectopy: none     Conduction: normal       ____________________________________________   INITIAL IMPRESSION /  ASSESSMENT AND PLAN / ED COURSE   Joshua Mayo was evaluated in Emergency Department on 03/19/2020 for the symptoms described in the history of present illness. He was evaluated in the context of the global COVID-19 pandemic, which necessitated consideration that the patient might be at risk for infection with the SARS-CoV-2 virus that causes COVID-19. Institutional protocols and algorithms that pertain to the evaluation of patients at risk for COVID-19 are in a state of rapid change based on information released by regulatory bodies including the CDC and federal and state organizations. These policies and algorithms were followed during the patient's care in the ED.    Most Likely DDx:  -EKG with new T wave inversions in the setting of significant hypertension will get CT dissection and CT head to rule intracranial hemorrhage.  Will keep patient the cardiac monitor due to chest pain  DDx that was also considered d/t potential to cause harm, but was found less likely based on history and physical (as detailed above): -PNA (no fevers, cough but CXR to evaluate) -PNX (reassured with equal b/l breath sounds, CXR to evaluate) -Symptomatic anemia (will get H&H) -Pulmonary embolism as no sob at rest, not pleuritic in nature, no hypoxia -Pericarditis no rub on exam, EKG changes or hx to suggest dx -Tamponade (no notable SOB, tachycardic, hypotensive) -Esophageal rupture (no h/o diffuse vomitting/no crepitus)   EKG is significantly abnormal from prior.  Will get cardiac markers.  We will hold off on aspirin and heparin at this time due to concern he could be having a dissection.  Will get CT head and CT dissection first  10:47 PM patient got the nitro and blood pressures came down and little bit more diaphoretic and pain got worse.  Repeat EKG my interpretation still does not have any evidence of STEMI but continues to have these deeply inverted T waves.,  QTC of 510.   CT dissection is negative.   Patient continues to have some chest pain 4 out of 10.  Will start patient on heparin and aspirin, alert the [facility] clinic cardiology.  Send him a message and attempted to page them but had not gotten a response.  Will admit patient to the hospital team.       ____________________________________________   FINAL CLINICAL IMPRESSION(S) / ED DIAGNOSES   Final diagnoses:  Unstable angina (HCC)  Hypertensive emergency     MEDICATIONS GIVEN DURING THIS VISIT:  Medications  morphine 4 MG/ML injection 8 mg (has no administration in time range)  nitroGLYCERIN (NITROSTAT) SL tablet 0.4 mg (0.4 mg Sublingual Given 03/19/20 2223)  aspirin chewable tablet 324 mg (has no administration in time range)  heparin bolus via infusion 4,000 Units (has no administration in time range)  heparin ADULT infusion 100 units/mL (25000 units/250mL) (has no administration in time range)  morphine 4 MG/ML injection 6 mg (6 mg Intravenous Given 03/19/20 2157)  ondansetron (ZOFRAN) injection 4 mg (4 mg Intravenous Given 03/19/20 2155)  iohexol (OMNIPAQUE) 350 MG/ML injection 100 mL (100 mLs Intravenous Contrast Given 03/19/20 2309)  ED Discharge Orders    None       Note:  This document was prepared using Dragon voice recognition software and may include unintentional dictation errors.   Concha Se, MD 03/19/20 [phone]

## 2020-03-19 NOTE — ED Triage Notes (Addendum)
Pt states tonight he started having chest pain associated with shortness of breath and dizziness. Pt states he checked his BP at home and it was elevated, pt has hx of heart failure. Pt states he normally drinks a pint of liquor a day and the last time he drank was 2 days ago. Pt diaphoretic in triage.

## 2020-03-19 NOTE — ED Notes (Signed)
Patient reports no contact order with his wife and requests that no member of the care team call her or speak with her should she call. MD made aware. Patient states his wife's name is Melissa.

## 2020-03-19 NOTE — ED Notes (Signed)
MD notified pt continues to report extreme chest tightness. 8/10 on numeric scale. See orders

## 2020-03-19 NOTE — H&P (Signed)
Joshua Mayo BJS:[id number] DOB: [birth year] DOA: 03/19/2020     PCP: Tamsen Roers, PA-C   Outpatient Specialists:   CARDS: Dr. Lady Gary    Patient arrived to ER on 03/19/20 at 2057 Referred by Attending Concha Se, MD   Patient coming from: home Lives   With SO   Chief Complaint:   Chief Complaint  Patient presents with  . Chest Pain    HPI: Joshua Mayo is a 45 y.o. 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 Started at rest severe CP while eating lunch, radiating to arms with tingling on the right arm getting worse but has been ongoing since lunch, BP up to 180  has been vomiting all day 2 days ago, has some diarrhea as well  reports chronic diarrhea for months 5 or more No blood, no mucus, sometimes its black Occasional blood on tissue  Sometimes takes BC powder rarely once a month  he uses Pepto bysmol on occasion not for the past 3 wks  CTA from 2020 showed non -obstructive CAD  Infectious risk factors:  Reports none    Has   been vaccinated against COVID    Initial COVID TEST   in house  PCR testing  Pending  Lab Results  Component Value Date   SARSCOV2NAA NEGATIVE 05/16/2018   SARSCOV2NAA NEGATIVE 05/15/2018    Regarding pertinent Chronic problems:      HTN on coreg, Cozaar   chronic CHF  systolic/ combined - last echo 2020    ejection fraction of 20-25%.   While in ER: T wave inversion no change on repeat Gave nitro BP dropped to 140 and he felt very bad Started on heparin and Aspirin     ED Triage Vitals  Enc Vitals Group     BP 03/19/20 2104 (!) 176/129     Pulse Rate 03/19/20 2104 96     Resp 03/19/20 2104 20     Temp 03/19/20 2104 98.4 F (36.9 C)     Temp Source 03/19/20 2104 Oral     SpO2 03/19/20 2104 95 %     Weight 03/19/20 2102 165 lb (74.8 kg)     Height 03/19/20 2102 5\' 7"   (1.702 m)     Head Circumference --      Peak Flow --      Pain Score 03/19/20 2102 4     Pain Loc --      Pain Edu? --      Excl. in GC? --   TMAX(24)@     _________________________________________ Significant initial  Findings: Abnormal Labs Reviewed  COMPREHENSIVE METABOLIC PANEL - Abnormal; Notable for the following components:      Result Value   Sodium 134 (*)    Potassium 3.1 (*)    CO2 17 (*)    AST 50 (*)    ALT 48 (*)    Total Bilirubin 1.4 (*)    Anion gap 17 (*)    All other components within normal limits   ____________________________________________ Ordered CT HEAD   NON acute  CXR -  NON acute  CTabd/pelvis/CTA chest  -  nonacute  no PE,   no evidence of infiltrate, hepatic steatosis   _________________________ Troponin 7  ECG: Ordered Personally reviewed by me showing: HR : 74 Rhythm:  NSR  T waves inversion  QTC 510  The recent clinical data is shown below. Vitals:   03/19/20 2230 03/19/20 2248 03/19/20 2300 03/19/20 2330  BP: (!) 142/102 (!) 168/115 (!) 165/114 (!) 156/102  Pulse: 65 79 68 71  Resp: (!) 23 13 19    Temp:      TempSrc:      SpO2: 98% 100% 98% 98%  Weight:      Height:          WBC     Component Value Date/Time   WBC 6.6 03/19/2020 2108   LYMPHSABS 3.4 03/19/2020 2108        UA  ordered    _______________________________________________________ ER Provider Called:  Cardiology   Dr. 2109 They Recommend admit to medicine  NPO pm, heparin ASA,  Will see in AM    _______________________________________________ Hospitalist was called for admission for hypertensive Urgency  The following Work up has been ordered so far:  Orders Placed This Encounter  Procedures  . Critical Care  . 1-3 Lead EKG Interpretation  . Resp Panel by RT-PCR (Flu A&B, Covid) Nasopharyngeal Swab  . DG Chest Portable 1 View  . CT Head Wo Contrast  . CT Angio Chest/Abd/Pel for Dissection W and/or Wo Contrast  . CBC with Differential  .  Comprehensive metabolic panel  . Brain natriuretic peptide  . Ethanol  . Urine Drug Screen, Qualitative ([facility] only)  . Protime-INR  . APTT  . Cardiac monitoring  . Consult to hospitalist  . heparin per pharmacy consult  . Pulse oximetry, continuous  . EKG 12-Lead  . ED EKG  . EKG 12-Lead  . EKG 12-Lead  . Saline lock IV    Following Medications were ordered in ER: Medications  morphine 4 MG/ML injection 8 mg (has no administration in time range)  nitroGLYCERIN (NITROSTAT) SL tablet 0.4 mg (0.4 mg Sublingual Given 03/19/20 2223)  aspirin chewable tablet 324 mg (has no administration in time range)  heparin bolus via infusion 4,000 Units (has no administration in time range)  heparin ADULT infusion 100 units/mL (25000 units/286mL) (has no administration in time range)  morphine 4 MG/ML injection 6 mg (6 mg Intravenous Given 03/19/20 2157)  ondansetron (ZOFRAN) injection 4 mg (4 mg Intravenous Given 03/19/20 2155)  iohexol (OMNIPAQUE) 350 MG/ML injection 100 mL (100 mLs Intravenous Contrast Given 03/19/20 2309)        Consult Orders  (From admission, onward)         Start     Ordered   03/19/20 2342  Consult to hospitalist  Once       Provider:  (Not yet assigned)  Question Answer Comment  Place call to: [id number]   Reason for Consult Admit      03/19/20 2342           OTHER Significant initial  Findings:  labs showing:    Recent Labs  Lab 03/19/20 2108  NA 134*  K 3.1*  CO2 17*  GLUCOSE 89  BUN 16  CREATININE 0.89  CALCIUM 9.2    Cr   Stable  Lab Results  Component Value Date   CREATININE 0.89 03/19/2020   CREATININE 1.30 (H) 08/16/2018   CREATININE 0.75 (L) 05/23/2018    Recent Labs  Lab 03/19/20 2108  AST 50*  ALT 48*  ALKPHOS 52  BILITOT 1.4*  PROT 7.7  ALBUMIN 4.6   Lab Results  Component Value Date   CALCIUM 9.2 03/19/2020      Plt: Lab Results  Component Value  Date   PLT 173 03/19/2020         Recent Labs  Lab 03/19/20 2108   WBC 6.6  NEUTROABS 2.3  HGB 13.3  HCT 39.7  MCV 90.0  PLT 173    HG/HCT   stable,      Component Value Date/Time   HGB 13.3 03/19/2020 2108   HGB 13.5 05/23/2018 1448   HCT 39.7 03/19/2020 2108   HCT 40.7 05/23/2018 1448   MCV 90.0 03/19/2020 2108   MCV 88 05/23/2018 1448    Cardiac Panel (last 3 results) Recent Labs    03/20/20 0001  CKTOTAL 220     BNP (last 3 results) Recent Labs    03/19/20 2108  BNP 22.4    Cultures:    Component Value Date/Time   SDES SPUTUM 05/16/2018 0600   SDES  05/16/2018 0600    SPUTUM Performed at [facility], [address] [place], [place] [address]    SPECREQUEST NONE 05/16/2018 0600   SPECREQUEST  05/16/2018 0600    NONE Reflexed from [id number] Performed at [facility], [address] [place], [place] [address]    CULT  05/16/2018 0600    MODERATE Consistent with normal respiratory flora. Performed at [facility], [address], [place], [place] [address]    REPTSTATUS 05/16/2018 FINAL 05/16/2018 0600   REPTSTATUS 05/18/2018 FINAL 05/16/2018 0600     Radiological Exams on Admission: CT Head Wo Contrast  Result Date: 03/19/2020 CLINICAL DATA:  Headache, intracranial hemorrhage suspected EXAM: CT HEAD WITHOUT CONTRAST TECHNIQUE: Contiguous axial images were obtained from the base of the skull through the vertex without intravenous contrast. COMPARISON:  None. FINDINGS: Brain: Minimal encephalomalacia in the right occipital parietal lobe, series 2, image 19. No evidence of acute infarct. No intracranial hemorrhage. No hydrocephalus, midline shift, mass lesion/mass effect or extra-axial collection. Vascular: No hyperdense vessel or unexpected calcification. Skull: Normal. Negative for fracture or focal lesion. Sinuses/Orbits: Paranasal sinuses and mastoid air cells are clear. The visualized orbits are unremarkable. Other: None. IMPRESSION: 1. No acute intracranial abnormality. 2. Minimal encephalomalacia  in the right occipital parietal lobe. Electronically Signed   By: Narda Rutherford M.D.   On: 03/19/2020 23:24   DG Chest Portable 1 View  Result Date: 03/19/2020 CLINICAL DATA:  Chest pain EXAM: PORTABLE CHEST 1 VIEW COMPARISON:  05/18/2018 FINDINGS: No focal opacity or pleural effusion. Cardiac size upper normal. No pneumothorax IMPRESSION: No active disease. Electronically Signed   By: Jasmine Pang M.D.   On: 03/19/2020 21:53   CT Angio Chest/Abd/Pel for Dissection W and/or Wo Contrast  Result Date: 03/19/2020 CLINICAL DATA:  Abdominal pain, aortic dissection suspected. EXAM: CT ANGIOGRAPHY CHEST, ABDOMEN AND PELVIS TECHNIQUE: Non-contrast CT of the chest was initially obtained. Multidetector CT imaging through the chest, abdomen and pelvis was performed using the standard protocol during bolus administration of intravenous contrast. Multiplanar reconstructed images and MIPs were obtained and reviewed to evaluate the vascular anatomy. CONTRAST:  OMNIPAQUE IOHEXOL 350 MG/ML SOLN COMPARISON:  Ultrasound abdomen 05/18/2018, CT abdomen pelvis 11/02/2007 FINDINGS: CTA CHEST FINDINGS Cardiovascular: Preferential opacification of the thoracic aorta. No evidence of thoracic aortic aneurysm or dissection. Normal heart size. No pericardial effusion. The main pulmonary artery is normal in caliber. No central pulmonary embolus. Mediastinum/Nodes: No enlarged mediastinal, hilar, or axillary lymph nodes. Thyroid gland, trachea, and esophagus demonstrate no significant findings. Lungs/Pleura: Bilateral lower lobe subsegmental atelectasis. Right middle lobe linear atelectasis. No focal consolidation. No pulmonary nodule.  No pulmonary mass. No pleural effusion. No pneumothorax. Musculoskeletal: No chest wall abnormality. No suspicious lytic or blastic osseous lesions. No acute displaced fracture. CTA ABDOMEN AND PELVIS FINDINGS VASCULAR Aorta: Normal caliber aorta without aneurysm, dissection, vasculitis or  significant stenosis. Celiac: Patent without evidence of aneurysm, dissection, vasculitis or significant stenosis. SMA: Patent without evidence of aneurysm, dissection, vasculitis or significant stenosis. Renals: Both renal arteries are patent without evidence of aneurysm, dissection, vasculitis, fibromuscular dysplasia or significant stenosis. IMA: Patent without evidence of aneurysm, dissection, vasculitis or significant stenosis. Inflow: Patent without evidence of aneurysm, dissection, vasculitis or significant stenosis. Veins: No obvious venous abnormality within the limitations of this arterial phase study. NON-VASCULAR Hepatobiliary: The hepatic parenchyma is diffusely hypodense compared to the splenic parenchyma consistent with fatty infiltration. No focal liver abnormality. No gallstones, gallbladder wall thickening, or pericholecystic fluid. No biliary dilatation. Pancreas: No focal lesion. Normal pancreatic contour. No surrounding inflammatory changes. No main pancreatic ductal dilatation. Spleen: Normal in size without focal abnormality. Adrenals/Urinary Tract: No adrenal nodule bilaterally. Bilateral kidneys enhance symmetrically. No hydronephrosis. No hydroureter. The urinary bladder is unremarkable. Stomach/Bowel: Stomach is within normal limits. No evidence of bowel wall thickening or dilatation. Appendix appears normal. Lymphatic: No lymphadenopathy Reproductive: Prostate is unremarkable. Other: No intraperitoneal free fluid. No intraperitoneal free gas. No organized fluid collection. Musculoskeletal: No abdominal abnormality. No suspicious lytic or blastic osseous lesions. No acute displaced fracture. Multilevel degenerative changes of the spine. Review of the MIP images confirms the above findings. IMPRESSION: 1. No acute vascular abnormality. 2. Hepatic steatosis. Otherwise no acute intrathoracic, intra-abdominal, intrapelvic abnormality. Electronically Signed   By: Tish FredericksonMorgane  Naveau M.D.   On:  03/19/2020 23:30   _______________________________________________________________________________________________________ Latest  Blood pressure (!) 156/102, pulse 71, temperature 98.4 F (36.9 C), temperature source Oral, resp. rate 19, height 5\' 7"  (1.702 m), weight 74.8 kg, SpO2 98 %.   Review of Systems:    Pertinent positives include:   abdominal pain, nausea, vomiting, diarrhea, chest pain, Constitutional:  No weight loss, night sweats, Fevers, chills, fatigue, weight loss  HEENT:  No headaches, Difficulty swallowing,Tooth/dental problems,Sore throat,  No sneezing, itching, ear ache, nasal congestion, post nasal drip,  Cardio-vascular:  No  Orthopnea, PND, anasarca, dizziness, palpitations.no Bilateral lower extremity swelling  GI:  No heartburn, indigestion, change in bowel habits, loss of appetite, melena, blood in stool, hematemesis Resp:  no shortness of breath at rest. No dyspnea on exertion, No excess mucus, no productive cough, No non-productive cough, No coughing up of blood.No change in color of mucus.No wheezing. Skin:  no rash or lesions. No jaundice GU:  no dysuria, change in color of urine, no urgency or frequency. No straining to urinate.  No flank pain.  Musculoskeletal:  No joint pain or no joint swelling. No decreased range of motion. No back pain.  Psych:  No change in mood or affect. No depression or anxiety. No memory loss.  Neuro: no localizing neurological complaints, no tingling, no weakness, no double vision, no gait abnormality, no slurred speech, no confusion  All systems reviewed and apart from HOPI all are negative _______________________________________________________________________________________________ Past Medical History:   Past Medical History:  Diagnosis Date  . CHF (congestive heart failure) (HCC)   . Hypertension       History reviewed. No pertinent surgical history.  Social History:  Ambulatory independently     reports  that he has been smoking cigarettes. He has a 20.00 pack-year smoking history. He has never used smokeless tobacco. He reports  current alcohol use. He reports that he does not use drugs.   Family History:   Family History  Problem Relation Age of Onset  . Heart disease Father   . Heart disease Paternal Grandfather    ______________________________________________________________________________________________ Allergies: Allergies  Allergen Reactions  . Banana Anaphylaxis     Prior to Admission medications   Medication Sig Start Date End Date Taking? Authorizing Provider  carvedilol (COREG) 12.5 MG tablet Take 1 tablet (12.5 mg total) by mouth 2 (two) times daily with a meal. 05/19/18   Enid Baas, MD  ENTRESTO 24-26 MG Take 1 tablet by mouth 2 (two) times daily. 03/10/20   [provider]  losartan (COZAAR) 50 MG tablet Take 1 tablet (50 mg total) by mouth daily. 05/20/18   Enid Baas, MD  sertraline (ZOLOFT) 50 MG tablet Take 1 tablet (50 mg total) by mouth daily. 12/17/19   Chrismon, Jodell Cipro, PA-C  spironolactone (ALDACTONE) 25 MG tablet Take 50 mg by mouth daily. 03/10/20   [provider]  tadalafil (CIALIS) 5 MG tablet Take 5 mg by mouth daily. 03/10/20   [provider]    ___________________________________________________________________________________________________ Physical Exam: Vitals with BMI 03/19/2020 03/19/2020 03/19/2020  Height - - -  Weight - - -  BMI - - -  Systolic 156 165 161  Diastolic 102 114 096  Pulse 71 68 79   1. General:  in No  Acute distress    Well -appearing 2. Psychological: Alert and   Oriented 3. Head/ENT:   Dry Mucous Membranes                          Head Non traumatic, neck supple                         Poor Dentition 4. SKIN:  decreased Skin turgor,  Skin clean Dry and intact no rash 5. Heart: Regular rate and rhythm no  Murmur, no Rub or gallop 6. Lungs:  no wheezes or crackles   7. Abdomen:  Soft,  non-tender, Non distended  bowel sounds present 8. Lower extremities: no clubbing, cyanosis, no  edema 9. Neurologically Grossly intact, moving all 4 extremities equally   10. MSK: Normal range of motion    Chart has been reviewed  ______________________________________________________________________________________________  Assessment/Plan   45 y.o. 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 Chest pain - ongoing but negative troponin and no dynamic changes on ECG , NPO , Cardiology consult in AM, started on heparin Continue to cycle cardiac enzymes and obtain echo in a.m. daily aspirin control blood pressure if able Other etiology could be potentially GI related given recent nausea vomiting Check lipase  . Hypokalemia -replace and replace hypoanemia  . Hypertensive urgency -restart home medications, patient had a very poor response to nitroglycerin will hold off for now avoid over aggressive blood pressure correction  . Alcohol abuse patient states he is interested in quitting order CIWA protocol may help with her blood pressure as well   . Tobacco abuse -  - Spoke about importance of quitting spent 5 minutes discussing options for treatment, prior attempts at quitting, and dangers of smoking  -At this point patient is    interested in quitting  - order nicotine patch   - nursing tobacco cessation protocol  .  Diarrhea -chronic will order gastric panel patient states occasionally he has black stools.  Hemoccult stool  . Melena -possible will Hemoccult stool follow CBC while being on anticoagulation If Hemoccult positive would benefit from GI consult No evidence of anemia, initiate Protonix No evidence of hematemesis to suggest variceal bleed  Elevated LFTs in the setting of alcohol abuse will obtain right upper quadrant  ultrasound and h hepatitis serologies Other plan as per orders.  History of chronic systolic CHF will repeat echogram and continue to follow fluid status  DVT prophylaxis:     heparin bolus via infusion 4,000 Units Start: 03/20/20 0000    Code Status:    Code Status: Prior FULL CODE   as per patient   I had personally discussed CODE STATUS with patient      Family Communication:   Family not at  Bedside    Disposition Plan:     To home once workup is complete and patient is stable   Following barriers for discharge:                            Electrolytes corrected                                                           Pain controlled with PO medications                            Will need consultants to evaluate patient prior to discharge                      Consults called: cardiology is aware   Admission status:  ED Disposition    ED Disposition Condition Comment   Admit  Hospital Area: [facility] [[id number]]  Level of Care: Progressive Cardiac [[id number]]  Admit to Progressive based on following criteria: CARDIOVASCULAR & THORACIC of moderate stability with acute coronary syndrome symptoms/low risk myocardial infarction/hypertensive urgency/arrhythmias/heart failure potentially compromising stability and stable post cardiovascular intervention patients.  Covid Evaluation: Asymptomatic Screening Protocol (No Symptoms)  Diagnosis: Hypertensive urgency [161096]  Admitting Physician: Therisa Doyne [[id number]]  Attending Physician: Therisa Doyne [[id number]]        Obs   Level of care  progressive tele indefinitely please discontinue once patient no longer qualifies COVID-19 Labs      Precautions: admitted as  asymptomatic screening protocol    PPE: Used by the provider:   N95  eye Goggles,  Gloves      Caoilainn Sacks 03/19/2020, 1:23 AM    [facility]     after 2 AM please page floor coverage PA If 7AM-7PM, please contact the  day team taking care of the patient using [email or web address]   Patient was evaluated in the context of the global COVID-19 pandemic, which necessitated consideration that the patient might be at risk for infection with the SARS-CoV-2 virus that causes COVID-19. Institutional protocols and algorithms that pertain to the evaluation of patients at risk for COVID-19 are in a state of rapid change based on information released by regulatory bodies including the CDC and federal and state organizations. These policies and algorithms were followed during the patient's care.

## 2020-03-19 NOTE — Progress Notes (Signed)
ANTICOAGULATION CONSULT NOTE - Initial Consult  Pharmacy Consult for Heparin  Indication: chest pain/ACS  Allergies  Allergen Reactions  . Banana Anaphylaxis    Patient Measurements: Height: 5\' 7"  (170.2 cm) Weight: 74.8 kg (165 lb) IBW/kg (Calculated) : 66.1 Heparin Dosing Weight: 74.8 kg   Vital Signs: Temp: 98.4 F (36.9 C) (03/16 2104) Temp Source: Oral (03/16 2104) BP: 156/102 (03/16 2330) Pulse Rate: 71 (03/16 2330)  Labs: Recent Labs    03/19/20 2108  HGB 13.3  HCT 39.7  PLT 173  CREATININE 0.89  TROPONINIHS 7    Estimated Creatinine Clearance: 97 mL/min (by C-G formula based on SCr of 0.89 mg/dL).   Medical History: Past Medical History:  Diagnosis Date  . CHF (congestive heart failure) (HCC)   . Hypertension     Medications:  (Not in a hospital admission)   Assessment: Pharmacy consulted to dose heparin in this 45 year old male admitted with ACS/NSTEMI.   CrCl = 97 ml/min No prior anticoag noted.   Goal of Therapy:  Heparin level 0.3-0.7 units/ml Monitor platelets by anticoagulation protocol: Yes   Plan:  Give 4000 units bolus x 1 Start heparin infusion at 900 units/hr Check anti-Xa level in 6 hours and daily while on heparin Continue to monitor H&H and platelets  Eino Whitner D 03/19/2020,11:56 PM

## 2020-03-20 ENCOUNTER — Encounter: Payer: Self-pay | Admitting: Gastroenterology

## 2020-03-20 ENCOUNTER — Observation Stay
Admit: 2020-03-20 | Discharge: 2020-03-20 | Disposition: A | Discharge status: Home / Self Care | Payer: BC Managed Care – PPO | Attending: Internal Medicine | Admitting: Internal Medicine

## 2020-03-20 ENCOUNTER — Observation Stay: Payer: BC Managed Care – PPO

## 2020-03-20 DIAGNOSIS — I161 Hypertensive emergency: Secondary | ICD-10-CM

## 2020-03-20 DIAGNOSIS — F1010 Alcohol abuse, uncomplicated: Secondary | ICD-10-CM | POA: Diagnosis not present

## 2020-03-20 DIAGNOSIS — E876 Hypokalemia: Secondary | ICD-10-CM | POA: Diagnosis present

## 2020-03-20 DIAGNOSIS — R079 Chest pain, unspecified: Secondary | ICD-10-CM | POA: Diagnosis present

## 2020-03-20 DIAGNOSIS — I25110 Atherosclerotic heart disease of native coronary artery with unstable angina pectoris: Secondary | ICD-10-CM | POA: Diagnosis not present

## 2020-03-20 DIAGNOSIS — I5022 Chronic systolic (congestive) heart failure: Secondary | ICD-10-CM | POA: Diagnosis not present

## 2020-03-20 DIAGNOSIS — R7989 Other specified abnormal findings of blood chemistry: Secondary | ICD-10-CM | POA: Diagnosis not present

## 2020-03-20 DIAGNOSIS — K921 Melena: Secondary | ICD-10-CM | POA: Diagnosis present

## 2020-03-20 DIAGNOSIS — K760 Fatty (change of) liver, not elsewhere classified: Secondary | ICD-10-CM | POA: Diagnosis not present

## 2020-03-20 DIAGNOSIS — I160 Hypertensive urgency: Secondary | ICD-10-CM | POA: Diagnosis present

## 2020-03-20 DIAGNOSIS — I200 Unstable angina: Secondary | ICD-10-CM

## 2020-03-20 DIAGNOSIS — R197 Diarrhea, unspecified: Secondary | ICD-10-CM | POA: Diagnosis present

## 2020-03-20 DIAGNOSIS — Z720 Tobacco use: Secondary | ICD-10-CM | POA: Diagnosis present

## 2020-03-20 LAB — ECHOCARDIOGRAM COMPLETE
AR max vel: 2.06 cm2
AV Area VTI: 1.86 cm2
AV Area mean vel: 1.95 cm2
AV Mean grad: 2 mmHg
AV Peak grad: 3.6 mmHg
Ao pk vel: 0.95 m/s
Area-P 1/2: 2.94 cm2
Height: 67 in
S' Lateral: 4.01 cm
Weight: 2632 oz

## 2020-03-20 LAB — CBC WITH DIFFERENTIAL/PLATELET
Abs Immature Granulocytes: 0.01 10*3/uL (ref 0.00–0.07)
Basophils Absolute: 0.1 10*3/uL (ref 0.0–0.1)
Basophils Relative: 1 %
Eosinophils Absolute: 0.1 10*3/uL (ref 0.0–0.5)
Eosinophils Relative: 2 %
HCT: 38 % — ABNORMAL LOW (ref 39.0–52.0)
Hemoglobin: 12.4 g/dL — ABNORMAL LOW (ref 13.0–17.0)
Immature Granulocytes: 0 %
Lymphocytes Relative: 50 %
Lymphs Abs: 2.4 10*3/uL (ref 0.7–4.0)
MCH: 29.8 pg (ref 26.0–34.0)
MCHC: 32.6 g/dL (ref 30.0–36.0)
MCV: 91.3 fL (ref 80.0–100.0)
Monocytes Absolute: 0.5 10*3/uL (ref 0.1–1.0)
Monocytes Relative: 10 %
Neutro Abs: 1.8 10*3/uL (ref 1.7–7.7)
Neutrophils Relative %: 37 %
Platelets: 148 10*3/uL — ABNORMAL LOW (ref 150–400)
RBC: 4.16 MIL/uL — ABNORMAL LOW (ref 4.22–5.81)
RDW: 12.8 % (ref 11.5–15.5)
WBC: 4.8 10*3/uL (ref 4.0–10.5)
nRBC: 0 % (ref 0.0–0.2)

## 2020-03-20 LAB — COMPREHENSIVE METABOLIC PANEL
ALT: 43 U/L (ref 0–44)
AST: 39 U/L (ref 15–41)
Albumin: 4.2 g/dL (ref 3.5–5.0)
Alkaline Phosphatase: 39 U/L (ref 38–126)
Anion gap: 16 — ABNORMAL HIGH (ref 5–15)
BUN: 15 mg/dL (ref 6–20)
CO2: 21 mmol/L — ABNORMAL LOW (ref 22–32)
Calcium: 8.6 mg/dL — ABNORMAL LOW (ref 8.9–10.3)
Chloride: 97 mmol/L — ABNORMAL LOW (ref 98–111)
Creatinine, Ser: 0.78 mg/dL (ref 0.61–1.24)
GFR, Estimated: >60 mL/min (ref >60)
Glucose, Bld: 77 mg/dL (ref 70–99)
Potassium: 3.1 mmol/L — ABNORMAL LOW (ref 3.5–5.1)
Sodium: 134 mmol/L — ABNORMAL LOW (ref 135–145)
Total Bilirubin: 2 mg/dL — ABNORMAL HIGH (ref 0.3–1.2)
Total Protein: 7 g/dL (ref 6.5–8.1)

## 2020-03-20 LAB — URINE DRUG SCREEN, QUALITATIVE (ARMC ONLY)
Amphetamines, Ur Screen: NOT DETECTED
Barbiturates, Ur Screen: NOT DETECTED
Benzodiazepine, Ur Scrn: NOT DETECTED
Cannabinoid 50 Ng, Ur ~~LOC~~: POSITIVE — AB
Cocaine Metabolite,Ur ~~LOC~~: NOT DETECTED
MDMA (Ecstasy)Ur Screen: NOT DETECTED
Methadone Scn, Ur: NOT DETECTED
Opiate, Ur Screen: POSITIVE — AB
Phencyclidine (PCP) Ur S: NOT DETECTED
Tricyclic, Ur Screen: NOT DETECTED

## 2020-03-20 LAB — TROPONIN I (HIGH SENSITIVITY)
Troponin I (High Sensitivity): 8 ng/L (ref <18)
Troponin I (High Sensitivity): 8 ng/L (ref <18)
Troponin I (High Sensitivity): 8 ng/L (ref <18)

## 2020-03-20 LAB — RESP PANEL BY RT-PCR (FLU A&B, COVID) ARPGX2
Influenza A by PCR: NEGATIVE
Influenza B by PCR: NEGATIVE
SARS Coronavirus 2 by RT PCR: NEGATIVE

## 2020-03-20 LAB — HEPARIN LEVEL (UNFRACTIONATED)
Heparin Unfractionated: 0.54 IU/mL (ref 0.30–0.70)
Heparin Unfractionated: 0.3 IU/mL (ref 0.30–0.70)

## 2020-03-20 LAB — LIPASE, BLOOD: Lipase: 132 U/L — ABNORMAL HIGH (ref 11–51)

## 2020-03-20 LAB — APTT: aPTT: 28 seconds (ref 24–36)

## 2020-03-20 LAB — MAGNESIUM
Magnesium: 1.6 mg/dL — ABNORMAL LOW (ref 1.7–2.4)
Magnesium: 2.4 mg/dL (ref 1.7–2.4)

## 2020-03-20 LAB — URINALYSIS, COMPLETE (UACMP) WITH MICROSCOPIC
Bacteria, UA: NONE SEEN
Bilirubin Urine: NEGATIVE
Glucose, UA: NEGATIVE mg/dL
Hgb urine dipstick: NEGATIVE
Ketones, ur: 80 mg/dL — AB
Leukocytes,Ua: NEGATIVE
Nitrite: NEGATIVE
Protein, ur: NEGATIVE mg/dL
Specific Gravity, Urine: 1.042 — ABNORMAL HIGH (ref 1.005–1.030)
Squamous Epithelial / HPF: NONE SEEN (ref 0–5)
pH: 6 (ref 5.0–8.0)

## 2020-03-20 LAB — PROTIME-INR
INR: 1 (ref 0.8–1.2)
Prothrombin Time: 12.5 seconds (ref 11.4–15.2)

## 2020-03-20 LAB — HEPATITIS PANEL, ACUTE
HCV Ab: NONREACTIVE
Hep A IgM: NONREACTIVE
Hep B C IgM: NONREACTIVE
Hepatitis B Surface Ag: NONREACTIVE

## 2020-03-20 LAB — PHOSPHORUS
Phosphorus: 2.9 mg/dL (ref 2.5–4.6)
Phosphorus: 3.9 mg/dL (ref 2.5–4.6)

## 2020-03-20 LAB — TSH: TSH: 2.459 u[IU]/mL (ref 0.350–4.500)

## 2020-03-20 LAB — HIV ANTIBODY (ROUTINE TESTING W REFLEX): HIV Screen 4th Generation wRfx: NONREACTIVE

## 2020-03-20 LAB — CK: Total CK: 220 U/L (ref 49–397)

## 2020-03-20 MED ORDER — PANTOPRAZOLE SODIUM 40 MG IV SOLR
40.0000 mg | Freq: Two times a day (BID) | INTRAVENOUS | Status: DC
  Administered 2020-03-20 (×2): 40 mg via INTRAVENOUS
  Filled 2020-03-20 (×2): qty 40

## 2020-03-20 MED ORDER — THIAMINE HCL 100 MG PO TABS
100.0000 mg | ORAL_TABLET | Freq: Every day | ORAL | Status: DC
  Administered 2020-03-20: 100 mg via ORAL
  Filled 2020-03-20: qty 1

## 2020-03-20 MED ORDER — SODIUM CHLORIDE 0.9% FLUSH
3.0000 mL | INTRAVENOUS | Status: DC | PRN
Start: 2020-03-20 — End: 2020-03-20

## 2020-03-20 MED ORDER — SERTRALINE HCL 50 MG PO TABS
50.0000 mg | ORAL_TABLET | Freq: Every day | ORAL | Status: DC
  Administered 2020-03-20: 50 mg via ORAL
  Filled 2020-03-20: qty 1

## 2020-03-20 MED ORDER — PANTOPRAZOLE SODIUM 40 MG PO TBEC: 40.0000 mg | DELAYED_RELEASE_TABLET | Freq: Every day | ORAL | Status: DC

## 2020-03-20 MED ORDER — POTASSIUM CHLORIDE 10 MEQ/100ML IV SOLN
10.0000 meq | INTRAVENOUS | Status: AC
  Administered 2020-03-20 (×3): 10 meq via INTRAVENOUS
  Filled 2020-03-20 (×4): qty 100

## 2020-03-20 MED ORDER — SODIUM CHLORIDE 0.9% FLUSH
3.0000 mL | Freq: Two times a day (BID) | INTRAVENOUS | Status: DC
  Administered 2020-03-20 (×2): 3 mL via INTRAVENOUS

## 2020-03-20 MED ORDER — ASPIRIN 81 MG PO CHEW: 81.0000 mg | CHEWABLE_TABLET | Freq: Every day | ORAL | Status: DC

## 2020-03-20 MED ORDER — SPIRONOLACTONE 25 MG PO TABS: 50.0000 mg | ORAL_TABLET | Freq: Every day | ORAL | Status: DC

## 2020-03-20 MED ORDER — SODIUM CHLORIDE 0.9 % IV SOLN: 250.0000 mL | INTRAVENOUS | Status: DC | PRN

## 2020-03-20 MED ORDER — LORAZEPAM 1 MG PO TABS
1.0000 mg | ORAL_TABLET | ORAL | Status: DC | PRN
  Administered 2020-03-20: 1 mg via ORAL
  Filled 2020-03-20: qty 1

## 2020-03-20 MED ORDER — HYDROCODONE-ACETAMINOPHEN 5-325 MG PO TABS
1.0000 | ORAL_TABLET | ORAL | Status: DC | PRN
  Administered 2020-03-20: 1 via ORAL
  Filled 2020-03-20: qty 1

## 2020-03-20 MED ORDER — ACETAMINOPHEN 325 MG PO TABS: 650.0000 mg | ORAL_TABLET | Freq: Four times a day (QID) | ORAL | Status: DC | PRN

## 2020-03-20 MED ORDER — NICOTINE 21 MG/24HR TD PT24
21.0000 mg | MEDICATED_PATCH | Freq: Every day | TRANSDERMAL | Status: DC
  Administered 2020-03-20: 21 mg via TRANSDERMAL
  Filled 2020-03-20: qty 1

## 2020-03-20 MED ORDER — MAGNESIUM SULFATE 2 GM/50ML IV SOLN
2.0000 g | Freq: Once | INTRAVENOUS | Status: AC
  Administered 2020-03-20: 2 g via INTRAVENOUS
  Filled 2020-03-20: qty 50

## 2020-03-20 MED ORDER — LORAZEPAM 2 MG/ML IJ SOLN
1.0000 mg | INTRAMUSCULAR | Status: DC | PRN
  Administered 2020-03-20: 2 mg via INTRAVENOUS
  Filled 2020-03-20: qty 1

## 2020-03-20 MED ORDER — ACETAMINOPHEN 650 MG RE SUPP: 650.0000 mg | Freq: Four times a day (QID) | RECTAL | Status: DC | PRN

## 2020-03-20 MED ORDER — THIAMINE HCL 100 MG/ML IJ SOLN: 100.0000 mg | Freq: Every day | INTRAMUSCULAR | Status: DC

## 2020-03-20 MED ORDER — CARVEDILOL 12.5 MG PO TABS
12.5000 mg | ORAL_TABLET | Freq: Two times a day (BID) | ORAL | Status: DC
  Administered 2020-03-20: 12.5 mg via ORAL
  Filled 2020-03-20: qty 1

## 2020-03-20 MED ORDER — ENOXAPARIN SODIUM 40 MG/0.4ML ~~LOC~~ SOLN: 40.0000 mg | SUBCUTANEOUS | Status: DC

## 2020-03-20 MED ORDER — ADULT MULTIVITAMIN W/MINERALS CH
1.0000 | ORAL_TABLET | Freq: Every day | ORAL | Status: DC
  Administered 2020-03-20: 1 via ORAL
  Filled 2020-03-20: qty 1

## 2020-03-20 MED ORDER — SACUBITRIL-VALSARTAN 24-26 MG PO TABS
1.0000 | ORAL_TABLET | Freq: Two times a day (BID) | ORAL | Status: DC
  Administered 2020-03-20 (×2): 1 via ORAL
  Filled 2020-03-20 (×2): qty 1

## 2020-03-20 MED ORDER — FOLIC ACID 1 MG PO TABS
1.0000 mg | ORAL_TABLET | Freq: Every day | ORAL | Status: DC
  Administered 2020-03-20: 1 mg via ORAL
  Filled 2020-03-20: qty 1

## 2020-03-20 NOTE — Progress Notes (Incomplete)
Patient's IVs removed, telemetry d/c. Discharge instructions and follow up appointments discussed with patient. Patient discharging to home. Patient states he is still experiencing chest pain and some dizziness. MD notified via secure chat. Per attending and cardiology, patient is instructed to follow up with cardiology within one week and is clear to d/c from both MD standpoints. Patient denies further questions or needs at this time. All documentation completed

## 2020-03-20 NOTE — Plan of Care (Signed)

## 2020-03-20 NOTE — ED Notes (Signed)
RN awaiting heparin from pharmacy in order to administer. Pharmacist aware and states will send shortly.

## 2020-03-20 NOTE — Plan of Care (Signed)
  Problem: Education: Goal: Knowledge of General Education information will improve Description: Including pain rating scale, medication(s)/side effects and non-pharmacologic comfort measures Outcome: Adequate for Discharge   Problem: Health Behavior/Discharge Planning: Goal: Ability to manage health-related needs will improve Outcome: Adequate for Discharge   Problem: Clinical Measurements: Goal: Ability to maintain clinical measurements within normal limits will improve Outcome: Adequate for Discharge Goal: Cardiovascular complication will be avoided Outcome: Adequate for Discharge   Problem: Coping: Goal: Level of anxiety will decrease Outcome: Adequate for Discharge   Problem: Pain Managment: Goal: General experience of comfort will improve Outcome: Adequate for Discharge   Problem: Safety: Goal: Ability to remain free from injury will improve Outcome: Adequate for Discharge   Problem: Education: Goal: Ability to demonstrate management of disease process will improve Outcome: Adequate for Discharge Goal: Ability to verbalize understanding of medication therapies will improve Outcome: Adequate for Discharge Goal: Individualized Educational Video(s) Outcome: Adequate for Discharge   Problem: Activity: Goal: Capacity to carry out activities will improve Outcome: Adequate for Discharge   Problem: Cardiac: Goal: Ability to achieve and maintain adequate cardiopulmonary perfusion will improve Outcome: Adequate for Discharge

## 2020-03-20 NOTE — Progress Notes (Signed)
*  PRELIMINARY RESULTS* Echocardiogram 2D Echocardiogram has been performed.  Joshua Mayo 03/20/2020, 10:39 AM

## 2020-03-20 NOTE — Discharge Instructions (Signed)

## 2020-03-20 NOTE — ED Notes (Signed)
Pharmacy contacted requesting verification of ordered meds for RN to administer 

## 2020-03-20 NOTE — Consult Note (Signed)
[facility] Consultation [facility]  Patient ID: Joshua Mayo, MRN: [id number], DOB/AGE: 1975-03-15 45 y.o. Admit date: 03/19/2020   Date of Consult: 03/20/2020 Primary Physician: Waverly Ferrarihrismon, Dennis E, PA-C Primary Cardiologist: Lady GaryFath  Chief Complaint:  Chief Complaint  Patient presents with  . Chest Pain   Reason for Consult: Chest pain  HPI: 45 y.o. male with known apparent alcoholic and/or idiopathic dilated cardiomyopathy with ejection fraction of 35% by echocardiogram in 2021.  The patient has had appropriate medication management for this and has had no apparent significant lower extremity edema pulmonary edema and/or congestive heart failure symptoms with appropriate medication management.  This appropriate medication management includes a beta-blocker Entresto and spironolactone.  The patient has had a recent CT angiogram showing minimal atherosclerosis and fractional flow reserve which was normal.  When presenting to the emergency room apparently the patient had some binge drinking in the last week but has not had any drinking in the last few days but presents with tingling in both hands with the right arm pain abdominal discomfort and other weakness and fatigue.  This is waxing and waning over the last day or 2 but has not had any significant issues in the last hours.  EKG has shown normal sinus rhythm with T wave inversion diffusely which is unchanged from previous EKGs.  Troponin level is 8.  Chest x-ray has shown no evidence of pulmonary edema.  Currently the patient is hemodynamically stable without evidence of acute coronary syndrome congestive heart failure and/or myocardial infarction.  The patient does have some malignant hypertension for which likely secondary to current situation and possibly not using his medication management for congestive heart failure and hypertension at this time  Past Medical History:  Diagnosis Date  . CHF (congestive heart failure) (HCC)   .  Hypertension       Surgical History: History reviewed. No pertinent surgical history.   Home Meds: Prior to Admission medications   Medication Sig Start Date End Date Taking? Authorizing Provider  carvedilol (COREG) 12.5 MG tablet Take 1 tablet (12.5 mg total) by mouth 2 (two) times daily with a meal. 05/19/18  Yes Enid BaasKalisetti, Radhika, MD  ENTRESTO 24-26 MG Take 1 tablet by mouth 2 (two) times daily. 03/10/20  Yes [provider]  losartan (COZAAR) 50 MG tablet Take 1 tablet (50 mg total) by mouth daily. 05/20/18  Yes Enid BaasKalisetti, Radhika, MD  sertraline (ZOLOFT) 50 MG tablet Take 1 tablet (50 mg total) by mouth daily. 12/17/19  Yes Chrismon, Jodell Ciproennis E, PA-C  spironolactone (ALDACTONE) 25 MG tablet Take 50 mg by mouth daily. 03/10/20  Yes [provider]  tadalafil (CIALIS) 5 MG tablet Take 5 mg by mouth daily. 03/10/20  Yes [provider]    Inpatient Medications:  . [START ON 03/21/2020] aspirin  81 mg Oral Daily  . carvedilol  12.5 mg Oral BID WC  . folic acid  1 mg Oral Daily  . multivitamin with minerals  1 tablet Oral Daily  . nicotine  21 mg Transdermal Daily  . pantoprazole (PROTONIX) IV  40 mg Intravenous Q12H  . sacubitril-valsartan  1 tablet Oral BID  . sertraline  50 mg Oral Daily  . sodium chloride flush  3 mL Intravenous Q12H  . [START ON 03/21/2020] spironolactone  50 mg Oral Daily  . thiamine  100 mg Oral Daily   Or  . thiamine  100 mg Intravenous Daily   . sodium chloride    . heparin 900 Units/hr (  03/20/20 0112)    Allergies:  Allergies  Allergen Reactions  . Banana Anaphylaxis    Social History   Socioeconomic History  . Marital status: Legally Separated    Spouse name: Not on file  . Number of children: Not on file  . Years of education: Not on file  . Highest education level: Not on file  Occupational History  . Not on file  Tobacco Use  . Smoking status: Current Every Day Smoker    Packs/day: 1.00    Years: 20.00    Pack  years: 20.00    Types: Cigarettes  . Smokeless tobacco: Never Used  Vaping Use  . Vaping Use: Never used  Substance and Sexual Activity  . Alcohol use: Yes    Comment: weekends  . Drug use: No    Types: Cocaine    Comment: previously used cocaine. Pt states he stopped on his own with the help of his current wife   . Sexual activity: Yes  Other Topics Concern  . Not on file  Social History Narrative  . Not on file   Social Determinants of Health   Financial Resource Strain: Not on file  Food Insecurity: Not on file  Transportation Needs: Not on file  Physical Activity: Not on file  Stress: Not on file  Social Connections: Not on file  Intimate Partner Violence: Not on file     Family History  Problem Relation Age of Onset  . Heart disease Father   . Heart disease Paternal Grandfather      Review of Systems Positive for tingling chest pain arm pain Negative for: General:  chills, fever, night sweats or weight changes.  Cardiovascular: PND orthopnea syncope dizziness  Dermatological skin lesions rashes Respiratory: Cough congestion Urologic: Frequent urination urination at night and hematuria Abdominal: negative for nausea, vomiting, diarrhea, bright red blood per rectum, melena, or hematemesis Neurologic: negative for visual changes, and/or hearing changes  All other systems reviewed and are otherwise negative except as noted above.  Labs: Recent Labs    03/20/20 0001  CKTOTAL 220   Lab Results  Component Value Date   WBC 4.8 03/20/2020   HGB 12.4 (L) 03/20/2020   HCT 38.0 (L) 03/20/2020   MCV 91.3 03/20/2020   PLT 148 (L) 03/20/2020    Recent Labs  Lab 03/20/20 0241  NA 134*  K 3.1*  CL 97*  CO2 21*  BUN 15  CREATININE 0.78  CALCIUM 8.6*  PROT 7.0  BILITOT 2.0*  ALKPHOS 39  ALT 43  AST 39  GLUCOSE 77   Lab Results  Component Value Date   CHOL 239 (H) 05/16/2018   HDL 100 05/16/2018   LDLCALC 120 (H) 05/16/2018   TRIG 95 05/16/2018    No results found for: DDIMER  Radiology/Studies:  CT Head Wo Contrast  Result Date: 03/19/2020 CLINICAL DATA:  Headache, intracranial hemorrhage suspected EXAM: CT HEAD WITHOUT CONTRAST TECHNIQUE: Contiguous axial images were obtained from the base of the skull through the vertex without intravenous contrast. COMPARISON:  None. FINDINGS: Brain: Minimal encephalomalacia in the right occipital parietal lobe, series 2, image 19. No evidence of acute infarct. No intracranial hemorrhage. No hydrocephalus, midline shift, mass lesion/mass effect or extra-axial collection. Vascular: No hyperdense vessel or unexpected calcification. Skull: Normal. Negative for fracture or focal lesion. Sinuses/Orbits: Paranasal sinuses and mastoid air cells are clear. The visualized orbits are unremarkable. Other: None. IMPRESSION: 1. No acute intracranial abnormality. 2. Minimal encephalomalacia in the right occipital parietal  lobe. Electronically Signed   By: Narda Rutherford M.D.   On: 03/19/2020 23:24   DG Chest Portable 1 View  Result Date: 03/19/2020 CLINICAL DATA:  Chest pain EXAM: PORTABLE CHEST 1 VIEW COMPARISON:  05/18/2018 FINDINGS: No focal opacity or pleural effusion. Cardiac size upper normal. No pneumothorax IMPRESSION: No active disease. Electronically Signed   By: Jasmine Pang M.D.   On: 03/19/2020 21:53   CT Angio Chest/Abd/Pel for Dissection W and/or Wo Contrast  Result Date: 03/19/2020 CLINICAL DATA:  Abdominal pain, aortic dissection suspected. EXAM: CT ANGIOGRAPHY CHEST, ABDOMEN AND PELVIS TECHNIQUE: Non-contrast CT of the chest was initially obtained. Multidetector CT imaging through the chest, abdomen and pelvis was performed using the standard protocol during bolus administration of intravenous contrast. Multiplanar reconstructed images and MIPs were obtained and reviewed to evaluate the vascular anatomy. CONTRAST:  OMNIPAQUE IOHEXOL 350 MG/ML SOLN COMPARISON:  Ultrasound abdomen 05/18/2018,  CT abdomen pelvis 11/02/2007 FINDINGS: CTA CHEST FINDINGS Cardiovascular: Preferential opacification of the thoracic aorta. No evidence of thoracic aortic aneurysm or dissection. Normal heart size. No pericardial effusion. The main pulmonary artery is normal in caliber. No central pulmonary embolus. Mediastinum/Nodes: No enlarged mediastinal, hilar, or axillary lymph nodes. Thyroid gland, trachea, and esophagus demonstrate no significant findings. Lungs/Pleura: Bilateral lower lobe subsegmental atelectasis. Right middle lobe linear atelectasis. No focal consolidation. No pulmonary nodule. No pulmonary mass. No pleural effusion. No pneumothorax. Musculoskeletal: No chest wall abnormality. No suspicious lytic or blastic osseous lesions. No acute displaced fracture. CTA ABDOMEN AND PELVIS FINDINGS VASCULAR Aorta: Normal caliber aorta without aneurysm, dissection, vasculitis or significant stenosis. Celiac: Patent without evidence of aneurysm, dissection, vasculitis or significant stenosis. SMA: Patent without evidence of aneurysm, dissection, vasculitis or significant stenosis. Renals: Both renal arteries are patent without evidence of aneurysm, dissection, vasculitis, fibromuscular dysplasia or significant stenosis. IMA: Patent without evidence of aneurysm, dissection, vasculitis or significant stenosis. Inflow: Patent without evidence of aneurysm, dissection, vasculitis or significant stenosis. Veins: No obvious venous abnormality within the limitations of this arterial phase study. NON-VASCULAR Hepatobiliary: The hepatic parenchyma is diffusely hypodense compared to the splenic parenchyma consistent with fatty infiltration. No focal liver abnormality. No gallstones, gallbladder wall thickening, or pericholecystic fluid. No biliary dilatation. Pancreas: No focal lesion. Normal pancreatic contour. No surrounding inflammatory changes. No main pancreatic ductal dilatation. Spleen: Normal in size without focal  abnormality. Adrenals/Urinary Tract: No adrenal nodule bilaterally. Bilateral kidneys enhance symmetrically. No hydronephrosis. No hydroureter. The urinary bladder is unremarkable. Stomach/Bowel: Stomach is within normal limits. No evidence of bowel wall thickening or dilatation. Appendix appears normal. Lymphatic: No lymphadenopathy Reproductive: Prostate is unremarkable. Other: No intraperitoneal free fluid. No intraperitoneal free gas. No organized fluid collection. Musculoskeletal: No abdominal abnormality. No suspicious lytic or blastic osseous lesions. No acute displaced fracture. Multilevel degenerative changes of the spine. Review of the MIP images confirms the above findings. IMPRESSION: 1. No acute vascular abnormality. 2. Hepatic steatosis. Otherwise no acute intrathoracic, intra-abdominal, intrapelvic abnormality. Electronically Signed   By: Tish Frederickson M.D.   On: 03/19/2020 23:30   US Abdomen Limited RUQ (LIVER/GB)  Result Date: 03/20/2020 CLINICAL DATA:  Elevated liver function tests EXAM: ULTRASOUND ABDOMEN LIMITED RIGHT UPPER QUADRANT COMPARISON:  03/19/2020, 05/18/2018 FINDINGS: Gallbladder: No gallstones or wall thickening visualized. No sonographic Murphy sign noted by sonographer. Common bile duct: Diameter: 2 mm Liver: Diffuse increased liver echotexture consistent with hepatic steatosis identified on preceding CT. No focal liver abnormalities. No biliary dilation. Portal vein is patent on color Doppler imaging with  normal direction of blood flow towards the liver. Other: None. IMPRESSION: 1. Hepatic steatosis. 2. Otherwise unremarkable exam. Electronically Signed   By: Sharlet Salina M.D.   On: 03/20/2020 03:37    EKG: Normal sinus rhythm with diffuse T wave inversions unchanged from before  Weights: [facility]   03/19/20 2102 03/20/20 0133  Weight: 74.8 kg 74.6 kg     Physical Exam: Blood pressure (!) 143/107, pulse 71, temperature 97.9 F (36.6 C), temperature source  Oral, resp. rate 16, height 5\' 7"  (1.702 m), weight 74.6 kg, SpO2 99 %. Body mass index is 25.76 kg/m. General: Well developed, well nourished, in no acute distress. Head eyes ears nose throat: Normocephalic, atraumatic, sclera non-icteric, no xanthomas, nares are without discharge. No apparent thyromegaly and/or mass  Lungs: Normal respiratory effort.  no wheezes, no rales, no rhonchi.  Heart: RRR with normal S1 S2. no murmur gallop, no rub, PMI is normal size and placement, carotid upstroke normal without bruit, jugular venous pressure is normal Abdomen: Soft, non-tender, non-distended with normoactive bowel sounds. No hepatomegaly. No rebound/guarding. No obvious abdominal masses. Abdominal aorta is normal size without bruit Extremities: No edema. no cyanosis, no clubbing, no ulcers  Peripheral : 2+ bilateral upper extremity pulses, 2+ bilateral femoral pulses, 2+ bilateral dorsal pedal pulse Neuro: Alert and oriented. No facial asymmetry. No focal deficit. Moves all extremities spontaneously. Musculoskeletal: Normal muscle tone without kyphosis Psych:  Responds to questions appropriately with a normal affect.    Assessment: 45 year old male with apparent idiopathic dilated cardiomyopathy possibly due to alcoholic injury with acute nonspecific symptoms possibly due to drinking alcohol and/or other concerns without evidence of acute coronary syndrome myocardial infarction and/or congestive heart failure with malignant hypertension most likely secondary to his binge drinking  Plan: 1.  Reinstatement of medication management for hypertension control and cardiomyopathy and congestive heart failure.  This includes reinstatement of Entresto and carvedilol.  Will consider the possibility of addition of amlodipine later today if these medication management reinstatement does not help with blood pressure control 2.  No further cardiac diagnostics necessary at this time due to no evidence of acute  coronary syndrome congestive heart failure with recent evaluations as listed in HPI 3.  Patient is to have medical treatment for the possibility of withdrawal 4.  Continue risk reduction in cardiovascular disease with diuretics as necessary 5.  Begin ambulation and follow-up for improvements of symptoms  Signed, 47 M.D. [facility] Cardiology 03/20/2020, 8:44 AM

## 2020-03-22 NOTE — Discharge Summary (Signed)
[place] at [facility]   PATIENT NAME: Joshua Mayo    MR#:  [id number]  DATE OF BIRTH:  01-Dec-1975  DATE OF ADMISSION:  03/19/2020   ADMITTING PHYSICIAN: Therisa Doyne, MD  DATE OF DISCHARGE: 03/20/2020  1:31 PM  PRIMARY CARE PHYSICIAN: Chrismon, Jodell Cipro, PA-C   ADMISSION DIAGNOSIS:  Unstable angina (HCC) [I20.0] Elevated LFTs [R79.89] Hypertensive urgency [I16.0] Hypertensive emergency [I16.1] DISCHARGE DIAGNOSIS:  Active Problems:   Chest pain   Hypokalemia   Hypertensive urgency   Alcohol abuse   Tobacco abuse   Diarrhea   Melena   Unstable angina (HCC)   Hypertensive emergency   Elevated LFTs  SECONDARY DIAGNOSIS:   Past Medical History:  Diagnosis Date  . CHF (congestive heart failure) (HCC)   . Hypertension    HOSPITAL COURSE:  45 y.o. male with medical history significant of  Alcoholic cardiomyopathy, Systolic CHF EF 35%, tobacco abuse Admitted for hypertensive urgency, CP, melenat EtOH abuse on CIWA  . Chest pain - ongoing but negative troponin and no dynamic changes on ECG. Likely noncardiac. Stress vs GI etio in differentials - cardio recommends outpt f/up  . Hypokalemia -replaced  . Hypertensive urgency -resolved.  . Alcohol abuse - recommended AA as an outpt  . Tobacco abuse - counseled for cessation  . Chronic Diarrhea (> 3 months) and occasional Melena - hemodynamically stable. outpt GI f/up. Patient prefers this and would like to go home.  Elevated LFTs in the setting of alcohol abuse - right upper quadrant ultrasound showed hepatic steatosis. Neg hepatitis panel outpt GI f/up  History of chronic systolic CHF echo showed EF 40-45%   DISCHARGE CONDITIONS:  stable CONSULTS OBTAINED:   DRUG ALLERGIES:   Allergies  Allergen Reactions  . Banana Anaphylaxis   DISCHARGE MEDICATIONS:   Allergies as of 03/20/2020      Reactions   Banana Anaphylaxis      Medication List    STOP taking these medications    losartan 50 MG tablet Commonly known as: COZAAR     TAKE these medications   carvedilol 12.5 MG tablet Commonly known as: COREG Take 1 tablet (12.5 mg total) by mouth 2 (two) times daily with a meal.   Entresto 24-26 MG Generic drug: sacubitril-valsartan Take 1 tablet by mouth 2 (two) times daily.   sertraline 50 MG tablet Commonly known as: ZOLOFT Take 1 tablet (50 mg total) by mouth daily.   spironolactone 25 MG tablet Commonly known as: ALDACTONE Take 50 mg by mouth daily.   tadalafil 5 MG tablet Commonly known as: CIALIS Take 5 mg by mouth daily.      DISCHARGE INSTRUCTIONS:   DIET:  Cardiac diet DISCHARGE CONDITION:  Stable ACTIVITY:  Activity as tolerated OXYGEN:  Home Oxygen: No.  Oxygen Delivery: room air DISCHARGE LOCATION:  home   If you experience worsening of your admission symptoms, develop shortness of breath, life threatening emergency, suicidal or homicidal thoughts you must seek medical attention immediately by calling 911 or calling your MD immediately  if symptoms less severe.  You Must read complete instructions/literature along with all the possible adverse reactions/side effects for all the Medicines you take and that have been prescribed to you. Take any new Medicines after you have completely understood and accpet all the possible adverse reactions/side effects.   Please note  You were cared for by a hospitalist during your hospital stay. If you have any questions about your discharge medications or the care you received while  you were in the hospital after you are discharged, you can call the unit and asked to speak with the hospitalist on call if the hospitalist that took care of you is not available. Once you are discharged, your primary care physician will handle any further medical issues. Please note that NO REFILLS for any discharge medications will be authorized once you are discharged, as it is imperative that you return to your  primary care physician (or establish a relationship with a primary care physician if you do not have one) for your aftercare needs so that they can reassess your need for medications and monitor your lab values.    On the day of Discharge:  VITAL SIGNS:  Blood pressure 134/81, pulse 83, temperature 97.8 F (36.6 C), temperature source Oral, resp. rate 18, height 5\' 7"  (1.702 m), weight 74.6 kg, SpO2 96 %. PHYSICAL EXAMINATION:  GENERAL:  45 y.o.-year-old patient lying in the bed with no acute distress.  EYES: Pupils equal, round, reactive to light and accommodation. No scleral icterus. Extraocular muscles intact.  HEENT: Head atraumatic, normocephalic. Oropharynx and nasopharynx clear.  NECK:  Supple, no jugular venous distention. No thyroid enlargement, no tenderness.  LUNGS: Normal breath sounds bilaterally, no wheezing, rales,rhonchi or crepitation. No use of accessory muscles of respiration.  CARDIOVASCULAR: S1, S2 normal. No murmurs, rubs, or gallops.  ABDOMEN: Soft, non-tender, non-distended. Bowel sounds present. No organomegaly or mass.  EXTREMITIES: No pedal edema, cyanosis, or clubbing.  NEUROLOGIC: Cranial nerves II through XII are intact. Muscle strength 5/5 in all extremities. Sensation intact. Gait not checked.  PSYCHIATRIC: The patient is alert and oriented x 3.  SKIN: No obvious rash, lesion, or ulcer.  DATA REVIEW:   CBC Recent Labs  Lab 03/20/20 0241  WBC 4.8  HGB 12.4*  HCT 38.0*  PLT 148*    Chemistries  Recent Labs  Lab 03/20/20 0241  NA 134*  K 3.1*  CL 97*  CO2 21*  GLUCOSE 77  BUN 15  CREATININE 0.78  CALCIUM 8.6*  MG 2.4  AST 39  ALT 43  ALKPHOS 39  BILITOT 2.0*     Outpatient follow-up  Follow-up Information    Chrismon, 03/22/20, PA-C. Schedule an appointment as soon as possible for a visit in 1 week.   Specialty: Family Medicine Why: Patient will need to make a follow up appointment. Contact information: [address] [place] [address]        [phone], MD. Schedule an appointment as soon as possible for a visit on 04/03/2020.   Specialty: Gastroenterology Why: @ 1:45pm Contact information: [address] [place] [phone]        [phone], MD. Schedule an appointment as soon as possible for a visit on 03/27/2020.   Specialty: Cardiology Why: @ 2pm Contact information: [address] [place] [phone]                  Management plans discussed with the patient, family and they are in agreement.  CODE STATUS: Prior   TOTAL TIME TAKING CARE OF THIS PATIENT: 45 minutes.    [phone] M.D on 03/22/2020 at 9:22 PM  [facility]   CC: Primary care physician; Chrismon, 03/24/2020, PA-C   Note: This dictation was prepared with Dragon dictation along with smaller phrase technology. Any transcriptional errors that result from this process are unintentional.

## 2020-03-26 ENCOUNTER — Other Ambulatory Visit: Payer: Self-pay

## 2020-03-26 ENCOUNTER — Encounter: Payer: Self-pay | Admitting: Gastroenterology

## 2020-03-26 ENCOUNTER — Ambulatory Visit: Payer: BC Managed Care – PPO | Admitting: Gastroenterology

## 2020-03-26 VITALS — BP 146/103 | HR 94 | Ht 67.0 in | Wt 166.0 lb

## 2020-03-26 DIAGNOSIS — R197 Diarrhea, unspecified: Secondary | ICD-10-CM | POA: Diagnosis not present

## 2020-03-26 DIAGNOSIS — K760 Fatty (change of) liver, not elsewhere classified: Secondary | ICD-10-CM

## 2020-03-26 DIAGNOSIS — R748 Abnormal levels of other serum enzymes: Secondary | ICD-10-CM | POA: Diagnosis not present

## 2020-03-26 DIAGNOSIS — R109 Unspecified abdominal pain: Secondary | ICD-10-CM

## 2020-03-26 DIAGNOSIS — R7989 Other specified abnormal findings of blood chemistry: Secondary | ICD-10-CM

## 2020-03-26 DIAGNOSIS — R112 Nausea with vomiting, unspecified: Secondary | ICD-10-CM

## 2020-03-26 DIAGNOSIS — Z1211 Encounter for screening for malignant neoplasm of colon: Secondary | ICD-10-CM

## 2020-03-26 MED ORDER — PEG 3350-KCL-NA BICARB-NACL 420 G PO SOLR: 4000.0000 mL | Freq: Once | ORAL | 0 refills | Status: AC

## 2020-03-26 NOTE — Progress Notes (Signed)
Joshua Mayo [address]  [address]  [place], [place] [address]  Main: [phone]  Fax: [phone]   Gastroenterology Consultation  Referring Provider:     Tamsen Roers, PA-C Primary Care Physician:  Tamsen Roers, PA-C Reason for Consultation:     Diarrhea, blood in stool        HPI:    Chief complaint: Diarrhea, abdominal pain, nausea and vomiting Joshua Mayo is a 45 y.o. y/o male referred for consultation & management  by Dr. Shella Spearing, Jodell Cipro, PA-C.  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  Patient reports chronic nausea vomiting, diarrhea, low appetite, abdominal pain.  Attributes this partly to his daily alcohol use.  No prior EGD or colonoscopy.  Notices bright red blood streaks with stool once or twice a day.  He has had 3-4 loose stools a day chronically.  Describes the pain as dull, 5/10, nonradiating, worsens with meals.  Right upper quadrant ultrasound in the hospital recently showed hepatic steatosis.  This was done for elevated liver enzymes.  CTA abdomen also shows hepatic steatosis, otherwise no acute abnormalities.  Hemoglobin 12.4, mildly low.  Normocytic  Transaminases mildly elevated but normalized on discharge  Past Medical History:  Diagnosis Date  . CHF (congestive heart failure) (HCC)   . Hypertension     No past surgical history on file.  Prior to Admission medications   Medication Sig Start Date End Date Taking? Authorizing Provider  carvedilol (COREG) 12.5 MG tablet Take 1 tablet (12.5 mg total) by mouth 2 (two) times daily with a meal. 05/19/18  Yes Enid Baas, MD  ENTRESTO 24-26 MG Take 1 tablet by mouth 2 (two) times daily. 03/10/20  Yes [provider]  sertraline (ZOLOFT) 50 MG tablet Take 1 tablet (50 mg  total) by mouth daily. 12/17/19  Yes Chrismon, Jodell Cipro, PA-C  spironolactone (ALDACTONE) 25 MG tablet Take 50 mg by mouth daily. 03/10/20  Yes [provider]  tadalafil (CIALIS) 5 MG tablet Take 5 mg by mouth daily. 03/10/20  Yes [provider]    Family History  Problem Relation Age of Onset  . Heart disease Father   . Heart disease Paternal Grandfather      Social History   Tobacco Use  . Smoking status: Current Every Day Smoker    Packs/day: 1.00    Years: 20.00    Pack years: 20.00    Types: Cigarettes  . Smokeless tobacco: Never Used  Vaping Use  . Vaping Use: Never used  Substance Use Topics  . Alcohol use: Yes    Comment: weekends  . Drug use: No    Types: Cocaine    Comment: previously used cocaine. Pt states he stopped on his own with the help of his current wife     Allergies as of 03/26/2020 - Review Complete 03/26/2020  Allergen Reaction Noted  . 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   Physical Exam:  BP (!) 146/103 (BP Location: Right Arm, Patient Position: Sitting, Cuff Size: Large)   Pulse 94   Ht  (1.702 m)   Wt 166 lb (75.3 kg)   BMI 26.00 kg/m  No LMP for male patient. Psych:  Alert and cooperative. Normal mood and affect. General:   Alert,  Well-developed, well-nourished, pleasant and cooperative in NAD Head:  Normocephalic and atraumatic. Eyes:  Sclera clear, no icterus.   Conjunctiva pink. Ears:  Normal auditory acuity. Nose:  No deformity, discharge, or lesions. Mouth:  No deformity or lesions,oropharynx pink & moist. Neck:  Supple; no masses or thyromegaly. Abdomen:  Normal bowel sounds.  No bruits.  Soft, non-tender and non-distended without masses, hepatosplenomegaly or hernias noted.  No guarding or rebound tenderness.    Msk:  Symmetrical without gross deformities. Good, equal movement & strength bilaterally. Pulses:  Normal pulses  noted. Extremities:  No clubbing or edema.  No cyanosis. Neurologic:  Alert and oriented x3;  grossly normal neurologically. Skin:  Intact without significant lesions or rashes. No jaundice. Lymph Nodes:  No significant cervical adenopathy. Psych:  Alert and cooperative. Normal mood and affect.   Labs: CBC    Component Value Date/Time   WBC 4.8 03/20/2020 0241   RBC 4.16 (L) 03/20/2020 0241   HGB 12.4 (L) 03/20/2020 0241   HGB 13.5 05/23/2018 1448   HCT 38.0 (L) 03/20/2020 0241   HCT 40.7 05/23/2018 1448   PLT 148 (L) 03/20/2020 0241   PLT 375 05/23/2018 1448   MCV 91.3 03/20/2020 0241   MCV 88 05/23/2018 1448   MCH 29.8 03/20/2020 0241   MCHC 32.6 03/20/2020 0241   RDW 12.8 03/20/2020 0241   RDW 11.7 05/23/2018 1448   LYMPHSABS 2.4 03/20/2020 0241   LYMPHSABS 2.8 05/23/2018 1448   MONOABS 0.5 03/20/2020 0241   EOSABS 0.1 03/20/2020 0241   EOSABS 0.2 05/23/2018 1448   BASOSABS 0.1 03/20/2020 0241   BASOSABS 0.1 05/23/2018 1448   CMP     Component Value Date/Time   NA 134 (L) 03/20/2020 0241   NA 139 05/23/2018 1448   K 3.1 (L) 03/20/2020 0241   CL 97 (L) 03/20/2020 0241   CO2 21 (L) 03/20/2020 0241   GLUCOSE 77 03/20/2020 0241   BUN 15 03/20/2020 0241   BUN 23 05/23/2018 1448   CREATININE 0.78 03/20/2020 0241   CALCIUM 8.6 (L) 03/20/2020 0241   PROT 7.0 03/20/2020 0241   PROT 7.3 05/23/2018 1448   ALBUMIN 4.2 03/20/2020 0241   ALBUMIN 4.8 05/23/2018 1448   AST 39 03/20/2020 0241   ALT 43 03/20/2020 0241   ALKPHOS 39 03/20/2020 0241   BILITOT 2.0 (H) 03/20/2020 0241   BILITOT 0.4 05/23/2018 1448   GFRNONAA >60 03/20/2020 0241   GFRAA 129 05/23/2018 1448    Imaging Studies: CT Head Wo Contrast  Result Date: 03/19/2020 CLINICAL DATA:  Headache, intracranial hemorrhage suspected EXAM: CT HEAD WITHOUT CONTRAST TECHNIQUE: Contiguous axial images were obtained from the base of the skull through the vertex without intravenous contrast. COMPARISON:  None.  FINDINGS: Brain: Minimal encephalomalacia in the right occipital parietal lobe, series 2, image 19. No evidence of acute infarct. No intracranial hemorrhage. No hydrocephalus, midline shift, mass lesion/mass effect or extra-axial collection. Vascular: No hyperdense vessel or unexpected calcification. Skull: Normal. Negative for fracture or focal lesion. Sinuses/Orbits: Paranasal sinuses and mastoid air cells are clear. The visualized orbits are unremarkable. Other: None. IMPRESSION: 1. No acute intracranial abnormality. 2. Minimal encephalomalacia in the right occipital parietal lobe. Electronically Signed   By: Narda RutherfordMelanie  Sanford M.D.   On: 03/19/2020 23:24   DG Chest Portable 1 View  Result Date: 03/19/2020 CLINICAL DATA:  Chest pain EXAM: PORTABLE CHEST 1 VIEW COMPARISON:  05/18/2018 FINDINGS: No  focal opacity or pleural effusion. Cardiac size upper normal. No pneumothorax IMPRESSION: No active disease. Electronically Signed   By: Jasmine Pang M.D.   On: 03/19/2020 21:53   ECHOCARDIOGRAM COMPLETE  Result Date: 03/20/2020    ECHOCARDIOGRAM REPORT   Patient Name:   ABDIRIZAK RICHISON Date of Exam: 03/20/2020 Medical Rec #:  [id number]        Height:       67.0 in Accession #:    [id number]       Weight:       164.5 lb Date of Birth:  09-28-1975        BSA:          1.861 m Patient Age:    44 years         BP:           138/99 mmHg Patient Gender: M                HR:           73 bpm. Exam Location:  [facility] Procedure: 2D Echo, Color Doppler, Cardiac Doppler and Strain Analysis Indications:     Chest pain R 07.9  History:         Patient has prior history of Echocardiogram examinations, most                  recent 05/16/2018. CHF; Risk Factors:Hypertension.  Sonographer:     Cristela Blue RDCS (AE) Referring Phys:  Kenn File DOUTOVA Diagnosing Phys: Arnoldo Hooker MD  Sonographer Comments: Global longitudinal strain was attempted. IMPRESSIONS  1. Left ventricular ejection fraction, by estimation, is 40 to 45%.  The left ventricle has mildly decreased function. The left ventricle demonstrates regional wall motion abnormalities (see scoring diagram/findings for description). The left ventricular  internal cavity size was mildly dilated. Left ventricular diastolic parameters were normal.  2. Right ventricular systolic function is normal. The right ventricular size is normal.  3. Left atrial size was mildly dilated.  4. The mitral valve is normal in structure. Mild mitral valve regurgitation.  5. The aortic valve is normal in structure. Aortic valve regurgitation is not visualized. FINDINGS  Left Ventricle: Left ventricular ejection fraction, by estimation, is 40 to 45%. The left ventricle has mildly decreased function. The left ventricle demonstrates regional wall motion abnormalities. The left ventricular internal cavity size was mildly dilated. There is no left ventricular hypertrophy. Left ventricular diastolic parameters were normal. Right Ventricle: The right ventricular size is normal. No increase in right ventricular wall thickness. Right ventricular systolic function is normal. Left Atrium: Left atrial size was mildly dilated. Right Atrium: Right atrial size was normal in size. Pericardium: There is no evidence of pericardial effusion. Mitral Valve: The mitral valve is normal in structure. Mild mitral valve regurgitation. Tricuspid Valve: The tricuspid valve is normal in structure. Tricuspid valve regurgitation is mild. Aortic Valve: The aortic valve is normal in structure. Aortic valve regurgitation is not visualized. Aortic valve mean gradient measures 2.0 mmHg. Aortic valve peak gradient measures 3.6 mmHg. Aortic valve area, by VTI measures 1.86 cm. Pulmonic Valve: The pulmonic valve was normal in structure. Pulmonic valve regurgitation is not visualized. Aorta: The aortic root and ascending aorta are structurally normal, with no evidence of dilitation. IAS/Shunts: No atrial level shunt detected by color flow  Doppler.  LEFT VENTRICLE PLAX 2D LVIDd:         5.51 cm  Diastology LVIDs:  4.01 cm  LV e' medial:    4.90 cm/s LV PW:         1.02 cm  LV E/e' medial:  11.3 LV IVS:        1.25 cm  LV e' lateral:   7.94 cm/s LVOT diam:     2.30 cm  LV E/e' lateral: 7.0 LV SV:         34 LV SV Index:   18 LVOT Area:     4.15 cm  RIGHT VENTRICLE RV S prime:     10.90 cm/s TAPSE (M-mode): 4.0 cm LEFT ATRIUM             Index       RIGHT ATRIUM           Index LA diam:        3.50 cm 1.88 cm/m  RA Area:     10.60 cm LA Vol (A2C):   54.5 ml 29.28 ml/m RA Volume:   21.50 ml  11.55 ml/m LA Vol (A4C):   49.0 ml 26.33 ml/m LA Biplane Vol: 51.9 ml 27.89 ml/m  AORTIC VALVE                   PULMONIC VALVE AV Area (Vmax):    2.06 cm    PV Vmax:        0.51 m/s AV Area (Vmean):   1.95 cm    PV Peak grad:   1.0 mmHg AV Area (VTI):     1.86 cm    RVOT Peak grad: 2 mmHg AV Vmax:           94.70 cm/s AV Vmean:          68.300 cm/s AV VTI:            0.185 m AV Peak Grad:      3.6 mmHg AV Mean Grad:      2.0 mmHg LVOT Vmax:         47.00 cm/s LVOT Vmean:        32.100 cm/s LVOT VTI:          0.083 m LVOT/AV VTI ratio: 0.45  AORTA Ao Root diam: 3.90 cm MITRAL VALVE               TRICUSPID VALVE MV Area (PHT): 2.94 cm    TR Peak grad:   13.7 mmHg MV Decel Time: 258 msec    TR Vmax:        185.00 cm/s MV E velocity: 55.30 cm/s MV A velocity: 76.30 cm/s  SHUNTS MV E/A ratio:  0.72        Systemic VTI:  0.08 m                            Systemic Diam: 2.30 cm Arnoldo Hooker MD Electronically signed by Arnoldo Hooker MD Signature Date/Time: 03/20/2020/1:16:38 PM    Final    CT Angio Chest/Abd/Pel for Dissection W and/or Wo Contrast  Result Date: 03/19/2020 CLINICAL DATA:  Abdominal pain, aortic dissection suspected. EXAM: CT ANGIOGRAPHY CHEST, ABDOMEN AND PELVIS TECHNIQUE: Non-contrast CT of the chest was initially obtained. Multidetector CT imaging through the chest, abdomen and pelvis was performed using the standard protocol during  bolus administration of intravenous contrast. Multiplanar reconstructed images and MIPs were obtained and reviewed to evaluate the vascular anatomy. CONTRAST:  OMNIPAQUE IOHEXOL 350 MG/ML SOLN COMPARISON:  Ultrasound abdomen 05/18/2018, CT abdomen  pelvis 11/02/2007 FINDINGS: CTA CHEST FINDINGS Cardiovascular: Preferential opacification of the thoracic aorta. No evidence of thoracic aortic aneurysm or dissection. Normal heart size. No pericardial effusion. The main pulmonary artery is normal in caliber. No central pulmonary embolus. Mediastinum/Nodes: No enlarged mediastinal, hilar, or axillary lymph nodes. Thyroid gland, trachea, and esophagus demonstrate no significant findings. Lungs/Pleura: Bilateral lower lobe subsegmental atelectasis. Right middle lobe linear atelectasis. No focal consolidation. No pulmonary nodule. No pulmonary mass. No pleural effusion. No pneumothorax. Musculoskeletal: No chest wall abnormality. No suspicious lytic or blastic osseous lesions. No acute displaced fracture. CTA ABDOMEN AND PELVIS FINDINGS VASCULAR Aorta: Normal caliber aorta without aneurysm, dissection, vasculitis or significant stenosis. Celiac: Patent without evidence of aneurysm, dissection, vasculitis or significant stenosis. SMA: Patent without evidence of aneurysm, dissection, vasculitis or significant stenosis. Renals: Both renal arteries are patent without evidence of aneurysm, dissection, vasculitis, fibromuscular dysplasia or significant stenosis. IMA: Patent without evidence of aneurysm, dissection, vasculitis or significant stenosis. Inflow: Patent without evidence of aneurysm, dissection, vasculitis or significant stenosis. Veins: No obvious venous abnormality within the limitations of this arterial phase study. NON-VASCULAR Hepatobiliary: The hepatic parenchyma is diffusely hypodense compared to the splenic parenchyma consistent with fatty infiltration. No focal liver abnormality. No gallstones, gallbladder  wall thickening, or pericholecystic fluid. No biliary dilatation. Pancreas: No focal lesion. Normal pancreatic contour. No surrounding inflammatory changes. No main pancreatic ductal dilatation. Spleen: Normal in size without focal abnormality. Adrenals/Urinary Tract: No adrenal nodule bilaterally. Bilateral kidneys enhance symmetrically. No hydronephrosis. No hydroureter. The urinary bladder is unremarkable. Stomach/Bowel: Stomach is within normal limits. No evidence of bowel wall thickening or dilatation. Appendix appears normal. Lymphatic: No lymphadenopathy Reproductive: Prostate is unremarkable. Other: No intraperitoneal free fluid. No intraperitoneal free gas. No organized fluid collection. Musculoskeletal: No abdominal abnormality. No suspicious lytic or blastic osseous lesions. No acute displaced fracture. Multilevel degenerative changes of the spine. Review of the MIP images confirms the above findings. IMPRESSION: 1. No acute vascular abnormality. 2. Hepatic steatosis. Otherwise no acute intrathoracic, intra-abdominal, intrapelvic abnormality. Electronically Signed   By: Tish Frederickson M.D.   On: 03/19/2020 23:30   US Abdomen Limited RUQ (LIVER/GB)  Result Date: 03/20/2020 CLINICAL DATA:  Elevated liver function tests EXAM: ULTRASOUND ABDOMEN LIMITED RIGHT UPPER QUADRANT COMPARISON:  03/19/2020, 05/18/2018 FINDINGS: Gallbladder: No gallstones or wall thickening visualized. No sonographic Murphy sign noted by sonographer. Common bile duct: Diameter: 2 mm Liver: Diffuse increased liver echotexture consistent with hepatic steatosis identified on preceding CT. No focal liver abnormalities. No biliary dilation. Portal vein is patent on color Doppler imaging with normal direction of blood flow towards the liver. Other: None. IMPRESSION: 1. Hepatic steatosis. 2. Otherwise unremarkable exam. Electronically Signed   By: Sharlet Salina M.D.   On: 03/20/2020 03:37    Assessment and Plan:   ANOTHY BUFANO  is a 45 y.o. y/o male has been referred for nausea vomiting abdominal pain diarrhea history of alcoholic cardiomyopathy, continues to drink alcohol, imaging showing hepatic steatosis  Patient has never had a screening colonoscopy and has had chronic diarrhea with rectal bleeding that needs to be evaluated further.  Likely cause may be underlying hemorrhoids as patient reports having problems with internal hemorrhoids before and was given steroid foam topical treatment by PCP a couple years ago.  Unlikely to be infectious given chronic nature of his symptoms  Given abdominal pain, associated nausea vomiting, EGD also indicated at the same time  His INR is normal, but last labs showed low normal platelets.  Albumin is normal  We will obtain further work-up for fatty liver  Finding of fatty liver on imaging discussed with patient Diet, weight loss, and exercise encouraged along with avoiding hepatotoxic drugs including alcohol Risk of progression to cirrhosis if above measures are not instituted were discussed as well, and patient verbalized understanding  We will fractionate bilirubin as well.  Patient is seeing his cardiologist tomorrow and send cardiac clearance request   Dr Joshua Mayo  Speech recognition software was used to dictate the above note.

## 2020-03-27 DIAGNOSIS — F1010 Alcohol abuse, uncomplicated: Secondary | ICD-10-CM | POA: Diagnosis not present

## 2020-03-27 DIAGNOSIS — I5020 Unspecified systolic (congestive) heart failure: Secondary | ICD-10-CM | POA: Diagnosis not present

## 2020-03-27 DIAGNOSIS — J9601 Acute respiratory failure with hypoxia: Secondary | ICD-10-CM | POA: Diagnosis not present

## 2020-03-27 DIAGNOSIS — I426 Alcoholic cardiomyopathy: Secondary | ICD-10-CM | POA: Diagnosis not present

## 2020-03-28 LAB — ANTI-MICROSOMAL ANTIBODY LIVER / KIDNEY: LKM1 Ab: 0.8 Units (ref 0.0–20.0)

## 2020-03-28 LAB — IRON AND TIBC
Iron Saturation: 41 % (ref 15–55)
Iron: 122 ug/dL (ref 38–169)
Total Iron Binding Capacity: 299 ug/dL (ref 250–450)
UIBC: 177 ug/dL (ref 111–343)

## 2020-03-28 LAB — HEPATITIS B SURFACE ANTIBODY,QUALITATIVE: Hep B Surface Ab, Qual: NONREACTIVE

## 2020-03-28 LAB — CERULOPLASMIN: Ceruloplasmin: 19.1 mg/dL (ref 16.0–31.0)

## 2020-03-28 LAB — HEPATITIS C ANTIBODY: Hep C Virus Ab: <0.1 s/co ratio (ref 0.0–0.9)

## 2020-03-28 LAB — HEPATITIS B CORE ANTIBODY, TOTAL: Hep B Core Total Ab: NEGATIVE

## 2020-03-28 LAB — HEPATITIS A ANTIBODY, TOTAL: hep A Total Ab: NEGATIVE

## 2020-03-28 LAB — FERRITIN: Ferritin: 783 ng/mL — ABNORMAL HIGH (ref 30–400)

## 2020-03-28 LAB — MITOCHONDRIAL/SMOOTH MUSCLE AB PNL
Mitochondrial Ab: <20 Units (ref 0.0–20.0)
Smooth Muscle Ab: 4 Units (ref 0–19)

## 2020-03-28 LAB — ANA: ANA Titer 1: NEGATIVE

## 2020-03-28 LAB — IGG: IgG (Immunoglobin G), Serum: 884 mg/dL (ref 603–1613)

## 2020-04-01 ENCOUNTER — Other Ambulatory Visit: Payer: Self-pay

## 2020-04-01 ENCOUNTER — Other Ambulatory Visit
Admission: RE | Admit: 2020-04-01 | Discharge: 2020-04-01 | Disposition: A | Discharge status: Home / Self Care | Payer: BC Managed Care – PPO | Source: Ambulatory Visit | Attending: Gastroenterology | Admitting: Gastroenterology

## 2020-04-01 DIAGNOSIS — Z79899 Other long term (current) drug therapy: Secondary | ICD-10-CM | POA: Diagnosis not present

## 2020-04-01 DIAGNOSIS — R112 Nausea with vomiting, unspecified: Secondary | ICD-10-CM | POA: Diagnosis not present

## 2020-04-01 DIAGNOSIS — K2950 Unspecified chronic gastritis without bleeding: Secondary | ICD-10-CM | POA: Diagnosis not present

## 2020-04-01 DIAGNOSIS — K6389 Other specified diseases of intestine: Secondary | ICD-10-CM | POA: Diagnosis not present

## 2020-04-01 DIAGNOSIS — K319 Disease of stomach and duodenum, unspecified: Secondary | ICD-10-CM | POA: Diagnosis not present

## 2020-04-01 DIAGNOSIS — R109 Unspecified abdominal pain: Secondary | ICD-10-CM | POA: Diagnosis not present

## 2020-04-01 DIAGNOSIS — Z20822 Contact with and (suspected) exposure to covid-19: Secondary | ICD-10-CM | POA: Insufficient documentation

## 2020-04-01 DIAGNOSIS — K222 Esophageal obstruction: Secondary | ICD-10-CM | POA: Diagnosis not present

## 2020-04-01 DIAGNOSIS — Z01812 Encounter for preprocedural laboratory examination: Secondary | ICD-10-CM | POA: Insufficient documentation

## 2020-04-01 DIAGNOSIS — K449 Diaphragmatic hernia without obstruction or gangrene: Secondary | ICD-10-CM | POA: Diagnosis not present

## 2020-04-01 DIAGNOSIS — Z7982 Long term (current) use of aspirin: Secondary | ICD-10-CM | POA: Diagnosis not present

## 2020-04-01 DIAGNOSIS — Z1211 Encounter for screening for malignant neoplasm of colon: Secondary | ICD-10-CM | POA: Diagnosis not present

## 2020-04-01 DIAGNOSIS — R197 Diarrhea, unspecified: Secondary | ICD-10-CM | POA: Diagnosis not present

## 2020-04-01 DIAGNOSIS — K621 Rectal polyp: Secondary | ICD-10-CM | POA: Diagnosis not present

## 2020-04-01 DIAGNOSIS — K2980 Duodenitis without bleeding: Secondary | ICD-10-CM | POA: Diagnosis not present

## 2020-04-01 DIAGNOSIS — F17210 Nicotine dependence, cigarettes, uncomplicated: Secondary | ICD-10-CM | POA: Diagnosis not present

## 2020-04-02 LAB — SARS CORONAVIRUS 2 (TAT 6-24 HRS): SARS Coronavirus 2: NEGATIVE

## 2020-04-03 ENCOUNTER — Ambulatory Visit
Admission: RE | Admit: 2020-04-03 | Discharge: 2020-04-03 | Disposition: A | Discharge status: Home / Self Care | Payer: BC Managed Care – PPO | Attending: Gastroenterology | Admitting: Gastroenterology

## 2020-04-03 ENCOUNTER — Ambulatory Visit: Payer: BC Managed Care – PPO | Admitting: Gastroenterology

## 2020-04-03 ENCOUNTER — Ambulatory Visit: Payer: BC Managed Care – PPO | Admitting: Certified Registered"

## 2020-04-03 ENCOUNTER — Encounter
Admission: RE | Disposition: A | Discharge status: Home / Self Care | Payer: Self-pay | Source: Home / Self Care | Attending: Gastroenterology

## 2020-04-03 ENCOUNTER — Other Ambulatory Visit: Payer: Self-pay

## 2020-04-03 ENCOUNTER — Encounter: Payer: Self-pay | Admitting: Gastroenterology

## 2020-04-03 DIAGNOSIS — K449 Diaphragmatic hernia without obstruction or gangrene: Secondary | ICD-10-CM | POA: Insufficient documentation

## 2020-04-03 DIAGNOSIS — R197 Diarrhea, unspecified: Secondary | ICD-10-CM | POA: Diagnosis not present

## 2020-04-03 DIAGNOSIS — K621 Rectal polyp: Secondary | ICD-10-CM | POA: Diagnosis not present

## 2020-04-03 DIAGNOSIS — Z20822 Contact with and (suspected) exposure to covid-19: Secondary | ICD-10-CM | POA: Insufficient documentation

## 2020-04-03 DIAGNOSIS — K3189 Other diseases of stomach and duodenum: Secondary | ICD-10-CM | POA: Diagnosis not present

## 2020-04-03 DIAGNOSIS — Z1211 Encounter for screening for malignant neoplasm of colon: Secondary | ICD-10-CM

## 2020-04-03 DIAGNOSIS — F17210 Nicotine dependence, cigarettes, uncomplicated: Secondary | ICD-10-CM | POA: Insufficient documentation

## 2020-04-03 DIAGNOSIS — K639 Disease of intestine, unspecified: Secondary | ICD-10-CM

## 2020-04-03 DIAGNOSIS — Z79899 Other long term (current) drug therapy: Secondary | ICD-10-CM | POA: Diagnosis not present

## 2020-04-03 DIAGNOSIS — K2980 Duodenitis without bleeding: Secondary | ICD-10-CM | POA: Insufficient documentation

## 2020-04-03 DIAGNOSIS — Z7982 Long term (current) use of aspirin: Secondary | ICD-10-CM | POA: Diagnosis not present

## 2020-04-03 DIAGNOSIS — K319 Disease of stomach and duodenum, unspecified: Secondary | ICD-10-CM | POA: Diagnosis not present

## 2020-04-03 DIAGNOSIS — K2950 Unspecified chronic gastritis without bleeding: Secondary | ICD-10-CM | POA: Insufficient documentation

## 2020-04-03 DIAGNOSIS — R112 Nausea with vomiting, unspecified: Secondary | ICD-10-CM | POA: Diagnosis not present

## 2020-04-03 DIAGNOSIS — R109 Unspecified abdominal pain: Secondary | ICD-10-CM

## 2020-04-03 DIAGNOSIS — D125 Benign neoplasm of sigmoid colon: Secondary | ICD-10-CM | POA: Diagnosis not present

## 2020-04-03 DIAGNOSIS — K222 Esophageal obstruction: Secondary | ICD-10-CM | POA: Diagnosis not present

## 2020-04-03 DIAGNOSIS — K6389 Other specified diseases of intestine: Secondary | ICD-10-CM | POA: Insufficient documentation

## 2020-04-03 HISTORY — PX: COLONOSCOPY WITH PROPOFOL: SHX5780

## 2020-04-03 HISTORY — PX: ESOPHAGOGASTRODUODENOSCOPY (EGD) WITH PROPOFOL: SHX5813

## 2020-04-03 SURGERY — COLONOSCOPY WITH PROPOFOL
Anesthesia: General

## 2020-04-03 MED ORDER — LIDOCAINE HCL (CARDIAC) PF 100 MG/5ML IV SOSY
PREFILLED_SYRINGE | INTRAVENOUS | Status: DC | PRN
  Administered 2020-04-03: 60 mg via INTRAVENOUS

## 2020-04-03 MED ORDER — GLYCOPYRROLATE 0.2 MG/ML IJ SOLN
INTRAMUSCULAR | Status: DC | PRN
  Administered 2020-04-03: .2 mg via INTRAVENOUS

## 2020-04-03 MED ORDER — PROPOFOL 10 MG/ML IV BOLUS
INTRAVENOUS | Status: DC | PRN
  Administered 2020-04-03: 100 mg via INTRAVENOUS

## 2020-04-03 MED ORDER — SODIUM CHLORIDE 0.9 % IV SOLN: INTRAVENOUS | Status: DC

## 2020-04-03 MED ORDER — MIDAZOLAM HCL 2 MG/2ML IJ SOLN
INTRAMUSCULAR | Status: DC | PRN
  Administered 2020-04-03 (×2): 1 mg via INTRAVENOUS

## 2020-04-03 MED ORDER — MIDAZOLAM HCL 2 MG/2ML IJ SOLN
INTRAMUSCULAR | Status: AC
  Filled 2020-04-03: qty 2

## 2020-04-03 MED ORDER — PROPOFOL 500 MG/50ML IV EMUL
INTRAVENOUS | Status: DC | PRN
  Administered 2020-04-03: 155 ug/kg/min via INTRAVENOUS

## 2020-04-03 NOTE — Op Note (Signed)
[facility] Gastroenterology Patient Name: Joshua Mayo Procedure Date: 04/03/2020 11:29 AM MRN: [id number] Account #: [id number] Date of Birth: 06-30-1975 Admit Type: Outpatient Age: [age] [facility]OM 2 Gender: Male Note Status: Finalized Procedure:             Colonoscopy Indications:           Screening for colorectal malignant neoplasm Providers:             Tirrell Buchberger B. Maximino Greenlandahiliani MD, MD Referring MD:          Jodell Ciproennis E. Chrismon, MD (Referring MD) Medicines:             Monitored Anesthesia Care Complications:         No immediate complications. Procedure:             Pre-Anesthesia Assessment:                        - ASA Grade Assessment: II - A patient with mild                         systemic disease.                        - Prior to the procedure, a History and Physical was                         performed, and patient medications, allergies and                         sensitivities were reviewed. The patient's tolerance                         of previous anesthesia was reviewed.                        - The risks and benefits of the procedure and the                         sedation options and risks were discussed with the                         patient. All questions were answered and informed                         consent was obtained.                        - Patient identification and proposed procedure were                         verified prior to the procedure by the physician, the                         nurse, the anesthesiologist, the anesthetist and the                         technician. The procedure was verified in the                         procedure  room.                        After obtaining informed consent, the colonoscope was                         passed under direct vision. Throughout the procedure,                         the patient's blood pressure, pulse, and oxygen                         saturations were  monitored continuously. The                         Colonoscope was introduced through the anus and                         advanced to the the cecum, identified by appendiceal                         orifice and ileocecal valve. The colonoscopy was                         performed with ease. The patient tolerated the                         procedure well. The quality of the bowel preparation                         was fair. Findings:      The perianal and digital rectal examinations were normal.      A 5 mm polyp was found in the rectum. The polyp was flat. The polyp was       removed with a cold snare. Resection and retrieval were complete.      A patchy area of mildly erythematous mucosa was found in the sigmoid       colon. Biopsies were taken with a cold forceps for histology.      The exam was otherwise without abnormality.      The rectum, sigmoid colon, descending colon, transverse colon, ascending       colon and cecum appeared normal. Biopsies for histology were taken with       a cold forceps from the cecum, ascending colon, transverse colon and       descending colon for evaluation of microscopic colitis.      No additional abnormalities were found on retroflexion. Impression:            - Preparation of the colon was fair.                        - One 5 mm polyp in the rectum, removed with a cold                         snare. Resected and retrieved.                        - Erythematous mucosa in the sigmoid colon. Biopsied.                        -  The examination was otherwise normal.                        - The rectum, sigmoid colon, descending colon,                         transverse colon, ascending colon and cecum are                         normal. Biopsied. Recommendation:        - Await pathology results.                        - Discharge patient to home (with escort).                        - Advance diet as tolerated.                        - Continue  present medications.                        - Repeat colonoscopy in 1 year, with 2 day prep.                        - The findings and recommendations were discussed with                         the patient.                        - The findings and recommendations were discussed with                         the patient's family.                        - Return to primary care physician as previously                         scheduled. Procedure Code(s):     --- Professional ---                        [phone], Colonoscopy, flexible; with removal of                         tumor(s), polyp(s), or other lesion(s) by snare                         technique                        45380, 59, Colonoscopy, flexible; with biopsy, single                         or multiple Diagnosis Code(s):     --- Professional ---                        Z12.11, Encounter for screening for malignant neoplasm  of colon                        K62.1, Rectal polyp                        K63.89, Other specified diseases of intestine CPT copyright 2019 [employer]. All rights reserved. The codes documented in this report are preliminary and upon coder review may  be revised to meet current compliance requirements.  [name], MD Michel Bickers B. Maximino Greenland MD, MD 04/03/2020 12:18:48 PM This report has been signed electronically. Number of Addenda: 0 Note Initiated On: 04/03/2020 11:29 AM Scope Withdrawal Time: 0 hours 14 minutes 49 seconds  Total Procedure Duration: 0 hours 16 minutes 59 seconds  Estimated Blood Loss:  Estimated blood loss: none.      [facility]

## 2020-04-03 NOTE — Anesthesia Preprocedure Evaluation (Addendum)
Anesthesia Evaluation  Patient identified by MRN, date of birth, ID band Patient awake    Reviewed: Allergy & Precautions, H&P , NPO status , Patient's Chart, lab work & pertinent test results  History of Anesthesia Complications Negative for: history of anesthetic complications  Airway Mallampati: II  TM Distance: >3 FB     Dental  (+) Teeth Intact   Pulmonary neg sleep apnea, neg COPD, Current Smoker,    breath sounds clear to auscultation       Cardiovascular hypertension, (-) angina+CHF  (-) Past MI and (-) Cardiac Stents (-) dysrhythmias  Rhythm:regular Rate:Normal  Echo 03/20/20: 1. Left ventricular ejection fraction, by estimation, is 40 to 45%. The  left ventricle has mildly decreased function. The left ventricle  demonstrates regional wall motion abnormalities (see scoring  diagram/findings for description). The left ventricular  internal cavity size was mildly dilated. Left ventricular diastolic  parameters were normal.  2. Right ventricular systolic function is normal. The right ventricular  size is normal.  3. Left atrial size was mildly dilated.  4. The mitral valve is normal in structure. Mild mitral valve  regurgitation.  5. The aortic valve is normal in structure. Aortic valve regurgitation is  not visualized.    Neuro/Psych negative neurological ROS  negative psych ROS   GI/Hepatic GERD  Controlled,(+)     substance abuse  alcohol use,   Endo/Other  negative endocrine ROS  Renal/GU negative Renal ROS  negative genitourinary   Musculoskeletal   Abdominal   Peds  Hematology negative hematology ROS (+)   Anesthesia Other Findings Past Medical History: No date: CHF (congestive heart failure) (HCC) No date: Hypertension  History reviewed. No pertinent surgical history.  BMI    Body Mass Index: 25.69 kg/m      Reproductive/Obstetrics negative OB ROS                             Anesthesia Physical Anesthesia Plan  ASA: III  Anesthesia Plan: General   Post-op Pain Management:    Induction:   PONV Risk Score and Plan: Propofol infusion and TIVA  Airway Management Planned: Nasal Cannula  Additional Equipment:   Intra-op Plan:   Post-operative Plan:   Informed Consent: I have reviewed the patients History and Physical, chart, labs and discussed the procedure including the risks, benefits and alternatives for the proposed anesthesia with the patient or authorized representative who has indicated his/her understanding and acceptance.     Dental Advisory Given  Plan Discussed with: Anesthesiologist, CRNA and Surgeon  Anesthesia Plan Comments:         Anesthesia Quick Evaluation

## 2020-04-03 NOTE — H&P (Signed)
[name], MD [address], [address], [place], [place], [address] [address], [address], [place], [place], [address] Phone: [phone]  Fax: [phone]  Primary Care Physician:  Tamsen Roers, PA-C   Pre-Procedure History & Physical: HPI:  Joshua Mayo is a 45 y.o. male is here for a colonoscopy and EGD.   Past Medical History:  Diagnosis Date  . CHF (congestive heart failure) (HCC)   . Hypertension     History reviewed. No pertinent surgical history.  Prior to Admission medications   Medication Sig Start Date End Date Taking? Authorizing Provider  aspirin EC 81 MG tablet Take 81 mg by mouth daily. Swallow whole.   Yes [provider]  carvedilol (COREG) 12.5 MG tablet Take 1 tablet (12.5 mg total) by mouth 2 (two) times daily with a meal. 05/19/18  Yes Enid Baas, MD  ENTRESTO 24-26 MG Take 1 tablet by mouth 2 (two) times daily. 03/10/20  Yes [provider]  sertraline (ZOLOFT) 50 MG tablet Take 1 tablet (50 mg total) by mouth daily. 12/17/19  Yes Chrismon, Jodell Cipro, PA-C  spironolactone (ALDACTONE) 25 MG tablet Take 50 mg by mouth daily. 03/10/20  Yes [provider]  tadalafil (CIALIS) 5 MG tablet Take 5 mg by mouth daily. 03/10/20  Yes [provider]    Allergies as of 03/27/2020 - Review Complete 03/26/2020  Allergen Reaction Noted  . 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 Heart disease Father   . Heart disease Paternal Grandfather     Social History   Socioeconomic History  . Marital status: Legally Separated    Spouse name: Not on file  . Number of children: Not on file  . Years of education: Not on file  . Highest education level: Not on file  Occupational History  . Not on file  Tobacco Use  . Smoking status: Current Every Day Smoker    Packs/day: 0.50    Years: 20.00    Pack years: 10.00    Types: Cigarettes  . Smokeless tobacco: Never Used   Vaping Use  . Vaping Use: Never used  Substance and Sexual Activity  . Alcohol use: Yes    Alcohol/week: 23.0 standard drinks    Types: 3 Cans of beer, 20 Standard drinks or equivalent per week    Comment: weekends pint to a 5th   . Drug use: No    Types: Cocaine    Comment: previously used cocaine. Pt states he stopped on his own with the help of his current wife   . Sexual activity: Yes  Other Topics Concern  . Not on file  Social History Narrative  . Not on file   Social Determinants of Health   Financial Resource Strain: Not on file  Food Insecurity: Not on file  Transportation Needs: Not on file  Physical Activity: Not on file  Stress: Not on file  Social Connections: Not on file  Intimate Partner Violence: Not on file    Review of Systems: See HPI, otherwise negative ROS  Physical Exam: BP (!) 136/103   Pulse 82   Temp (!) 96.7 F (35.9 C) (Temporal)   Ht 5\' 7"  (1.702 m)   Wt 74.4 kg   SpO2 100%   BMI 25.69 kg/m  General:   Alert,  pleasant and cooperative in NAD Head:  Normocephalic and atraumatic. Neck:  Supple; no masses or thyromegaly. Lungs:  Clear throughout to auscultation,  normal respiratory effort.    Heart:  +S1, +S2, Regular rate and rhythm, No edema. Abdomen:  Soft, nontender and nondistended. Normal bowel sounds, without guarding, and without rebound.   Neurologic:  Alert and  oriented x4;  grossly normal neurologically.  Impression/Plan: [name] is here for a colonoscopy to be performed for average risk screening and EGD for abdominal pain, nausea/vomiting.  Risks, benefits, limitations, and alternatives regarding the procedures have been reviewed with the patient.  Questions have been answered.  All parties agreeable.   Pasty Spillers, MD  04/03/2020, 10:47 AM

## 2020-04-03 NOTE — Op Note (Signed)
[facility] Gastroenterology Patient Name: Joshua Mayo Procedure Date: 04/03/2020 11:30 AM MRN: [id number] Account #: [id number] Date of Birth: May 20, 1975 Admit Type: Outpatient Age: 45 Room: [facility] ENDO ROOM 2 Gender: Male Note Status: Finalized Procedure:             Upper GI endoscopy Indications:           Abdominal pain, Diarrhea, Nausea with vomiting Providers:             Kiele Heavrin B. Maximino Greenland MD, MD Referring MD:          Jodell Cipro. Chrismon, MD (Referring MD) Medicines:             Monitored Anesthesia Care Complications:         No immediate complications. Procedure:             Pre-Anesthesia Assessment:                        - Prior to the procedure, a History and Physical was                         performed, and patient medications, allergies and                         sensitivities were reviewed. The patient's tolerance                         of previous anesthesia was reviewed.                        - The risks and benefits of the procedure and the                         sedation options and risks were discussed with the                         patient. All questions were answered and informed                         consent was obtained.                        - Patient identification and proposed procedure were                         verified prior to the procedure by the physician, the                         nurse, the anesthesiologist, the anesthetist and the                         technician. The procedure was verified in the                         procedure room.                        - ASA Grade Assessment: II - A patient with mild  systemic disease.                        After obtaining informed consent, the endoscope was                         passed under direct vision. Throughout the procedure,                         the patient's blood pressure, pulse, and oxygen                         saturations  were monitored continuously. The Endoscope                         was introduced through the mouth, and advanced to the                         second part of duodenum. The upper GI endoscopy was                         accomplished with ease. The patient tolerated the                         procedure well. Findings:      A widely patent and non-obstructing Schatzki ring was found at the       gastroesophageal junction.      The exam of the esophagus was otherwise normal.      Patchy mildly erythematous mucosa without bleeding was found in the       gastric antrum. Biopsies were taken with a cold forceps for histology.       Biopsies were obtained in the gastric body, at the incisura and in the       gastric antrum with cold forceps for histology.      Mucosal changes characterized by thickened folds were found in the       gastric antrum. Biopsies were taken with a cold forceps for histology.      A small hiatal hernia was present.      The exam of the stomach was otherwise normal.      Patchy mild mucosal changes characterized by discoloration and       nodularity were found in the second portion of the duodenum. Biopsies       were taken with a cold forceps for histology.      The exam of the duodenum was otherwise normal. Impression:            - Widely patent and non-obstructing Schatzki ring.                        - Erythematous mucosa in the antrum. Biopsied.                        - Thickened folds mucosa in the antrum. Biopsied.                        - Small hiatal hernia.                        - Mucosal changes in the duodenum. Biopsied.                        -  Biopsies were obtained in the gastric body, at the                         incisura and in the gastric antrum. Recommendation:        - Await pathology results.                        - Discharge patient to home (with escort).                        - Advance diet as tolerated.                        - Continue  present medications.                        - Patient has a contact number available for                         emergencies. The signs and symptoms of potential                         delayed complications were discussed with the patient.                         Return to normal activities tomorrow. Written                         discharge instructions were provided to the patient.                        - Discharge patient to home (with escort).                        - The findings and recommendations were discussed with                         the patient.                        - The findings and recommendations were discussed with                         the patient's family.                        - Follow an antireflux regimen. Procedure Code(s):     --- Professional ---                        [id number], Esophagogastroduodenoscopy, flexible,                         transoral; with biopsy, single or multiple Diagnosis Code(s):     --- Professional ---                        K22.2, Esophageal obstruction                        K31.89, Other diseases of stomach and duodenum  K44.9, Diaphragmatic hernia without obstruction or                         gangrene                        R10.9, Unspecified abdominal pain                        R19.7, Diarrhea, unspecified                        R11.2, Nausea with vomiting, unspecified CPT copyright 2019 American Medical Association. All rights reserved. The codes documented in this report are preliminary and upon coder review may  be revised to meet current compliance requirements.  [name], MD Michel Bickers B. Maximino Greenland MD, MD 04/03/2020 11:54:35 AM This report has been signed electronically. Number of Addenda: 0 Note Initiated On: 04/03/2020 11:30 AM Estimated Blood Loss:  Estimated blood loss: none.      [facility]

## 2020-04-03 NOTE — Transfer of Care (Signed)
Immediate Anesthesia Transfer of Care Note  Patient: Joshua Mayo  Procedure(s) Performed: COLONOSCOPY WITH PROPOFOL (N/A ) ESOPHAGOGASTRODUODENOSCOPY (EGD) WITH PROPOFOL (N/A )  Patient Location: Endoscopy Unit  Anesthesia Type:General  Level of Consciousness: drowsy, patient cooperative and responds to stimulation  Airway & Oxygen Therapy: Patient Spontanous Breathing and Patient connected to face mask oxygen  Post-op Assessment: Report given to RN and Post -op Vital signs reviewed and stable  Post vital signs: Reviewed and stable  Last Vitals:  Vitals Value Taken Time  BP 114/84 04/03/20 1217  Temp    Pulse 84 04/03/20 1219  Resp 16 04/03/20 1219  SpO2 100 % 04/03/20 1219  Vitals shown include unvalidated device data.  Last Pain:  Vitals:   04/03/20 1216  TempSrc:   PainSc: 0-No pain         Complications: No complications documented.

## 2020-04-04 LAB — SURGICAL PATHOLOGY

## 2020-04-04 NOTE — Anesthesia Postprocedure Evaluation (Signed)
Anesthesia Post Note  Patient: DALVIN CLIPPER  Procedure(s) Performed: COLONOSCOPY WITH PROPOFOL (N/A ) ESOPHAGOGASTRODUODENOSCOPY (EGD) WITH PROPOFOL (N/A )  Patient location during evaluation: PACU Anesthesia Type: General Level of consciousness: awake and alert Pain management: pain level controlled Vital Signs Assessment: post-procedure vital signs reviewed and stable Respiratory status: spontaneous breathing, nonlabored ventilation and respiratory function stable Cardiovascular status: blood pressure returned to baseline and stable Postop Assessment: no apparent nausea or vomiting Anesthetic complications: no   No complications documented.   Last Vitals:  Vitals:   04/03/20 1236 04/03/20 1246  BP: (!) 129/99   Pulse: 75 76  Resp: (!) 21 (!) 23  Temp:    SpO2: 100% 99%    Last Pain:  Vitals:   04/04/20 0741  TempSrc:   PainSc: 2                  Karleen Hampshire

## 2020-04-07 ENCOUNTER — Other Ambulatory Visit: Payer: Self-pay | Admitting: Family Medicine

## 2020-04-07 DIAGNOSIS — F411 Generalized anxiety disorder: Secondary | ICD-10-CM

## 2020-04-10 ENCOUNTER — Inpatient Hospital Stay: Payer: Self-pay | Admitting: Family Medicine

## 2020-04-10 ENCOUNTER — Encounter: Payer: Self-pay | Admitting: Gastroenterology

## 2020-04-10 NOTE — Addendum Note (Signed)
Addended by: [name] on: 04/10/2020 11:49 AM   Modules accepted: Orders

## 2020-04-15 ENCOUNTER — Other Ambulatory Visit: Payer: Self-pay

## 2020-04-15 ENCOUNTER — Ambulatory Visit (INDEPENDENT_AMBULATORY_CARE_PROVIDER_SITE_OTHER): Payer: Self-pay | Admitting: Family Medicine

## 2020-04-15 ENCOUNTER — Encounter: Payer: Self-pay | Admitting: Family Medicine

## 2020-04-15 VITALS — BP 111/77 | HR 88 | Temp 97.8°F | Wt 165.0 lb

## 2020-04-15 DIAGNOSIS — Z09 Encounter for follow-up examination after completed treatment for conditions other than malignant neoplasm: Secondary | ICD-10-CM

## 2020-04-15 DIAGNOSIS — F1010 Alcohol abuse, uncomplicated: Secondary | ICD-10-CM | POA: Diagnosis not present

## 2020-04-15 DIAGNOSIS — F411 Generalized anxiety disorder: Secondary | ICD-10-CM

## 2020-04-15 MED ORDER — ESCITALOPRAM OXALATE 10 MG PO TABS: 10.0000 mg | ORAL_TABLET | Freq: Every day | ORAL | 1 refills | Status: DC

## 2020-04-15 MED ORDER — CARVEDILOL 25 MG PO TABS: 25.0000 mg | ORAL_TABLET | Freq: Two times a day (BID) | ORAL | 3 refills | Status: DC

## 2020-04-15 NOTE — Progress Notes (Signed)
Established patient visit   Patient: Joshua Mayo   DOB: [birth year]   45 y.o. Male  MRN: [id number] Visit Date: 04/15/2020  Today's healthcare provider: Azalee Course Hadia Minier, FNP   Chief Complaint  Patient presents with  . Hospitalization Follow-up   Subjective    Abdominal Pain This is a new problem. The current episode started more than 1 month ago. The problem has been unchanged. The pain is located in the generalized abdominal region. The quality of the pain is aching, dull, burning, cramping and sharp. Associated symptoms include anorexia, diarrhea, nausea and vomiting. Pertinent negatives include no constipation, fever or headaches.    Follow up Hospitalization  Patient was admitted to [facility] on 03/19/2020 and discharged on 03/20/2020. Treatment for this included increasing carvedilol to 25mg  twice a day. Telephone follow up was done on N/A He reports excellent compliance with treatment. He reports this condition is stayed the same.  Chest pain and numbness to arm brought him to the ED Determined alcohol was the cause Had a colonoscopy on the 31st of March Had a few polyps removed Still having tarish diarrhea, black, going 6-7 times a day Still having abdominal pain, cramping Is awaiting to hear GI reqs Has a hard time keeping down food Feels this is alcohol related Used to drink every day 1 pint to a fifth of 10-15-1976 Had cut back to once or twice a week Now out of the house and lost his job Now a couple of drinks a week Am interested in quitting completely Not liking the Zoloft Has shakes sometimes and worries this is from the medication Has been Zoloft 2-3 months  Has been on Trazodone in the past  ----------------------------------------------------------------------------------------- -    Patient Active Problem List   Diagnosis Date Noted  . Colon cancer screening   . Erythema of colon   . Rectal polyp   . Abdominal pain   . Gastric erythema    . Chest pain 03/20/2020  . Hypokalemia 03/20/2020  . Hypertensive urgency 03/20/2020  . Alcohol abuse 03/20/2020  . Tobacco abuse 03/20/2020  . Diarrhea 03/20/2020  . Melena 03/20/2020  . Unstable angina (HCC)   . Hypertensive emergency   . Elevated LFTs   . Acute respiratory failure with hypoxia (HCC) 05/15/2018  . Esophageal reflux 01/08/2016  . Internal hemorrhoids without complication 01/08/2016  . Snoring 01/08/2016   Past Medical History:  Diagnosis Date  . CHF (congestive heart failure) (HCC)   . Hypertension    Social History   Tobacco Use  . Smoking status: Current Every Day Smoker    Packs/day: 0.50    Years: 20.00    Pack years: 10.00    Types: Cigarettes  . Smokeless tobacco: Never Used  Vaping Use  . Vaping Use: Never used  Substance Use Topics  . Alcohol use: Yes    Alcohol/week: 23.0 standard drinks    Types: 3 Cans of beer, 20 Standard drinks or equivalent per week    Comment: weekends pint to a 5th   . Drug use: No    Types: Cocaine    Comment: previously used cocaine. Pt states he stopped on his own with the help of his current wife    Allergies  Allergen Reactions  . Banana Anaphylaxis     Medications: Outpatient Medications Prior to Visit  Medication Sig  . aspirin EC 81 MG tablet Take 81 mg by mouth daily. Swallow whole.  03/07/2016 ENTRESTO 24-26 MG Take 1  tablet by mouth 2 (two) times daily.  Marland Kitchen spironolactone (ALDACTONE) 25 MG tablet Take 50 mg by mouth daily.  . tadalafil (CIALIS) 5 MG tablet Take 5 mg by mouth daily.  . [DISCONTINUED] sertraline (ZOLOFT) 50 MG tablet TAKE 1 TABLET BY MOUTH EVERY DAY  . [DISCONTINUED] carvedilol (COREG) 12.5 MG tablet Take 1 tablet (12.5 mg total) by mouth 2 (two) times daily with a meal.   No facility-administered medications prior to visit.    Review of Systems  Constitutional: Positive for fatigue. Negative for activity change, appetite change, chills, diaphoresis, fever and unexpected weight change.   Respiratory: Positive for shortness of breath. Negative for apnea, cough, choking, chest tightness, wheezing and stridor.   Cardiovascular: Positive for chest pain and palpitations. Negative for leg swelling.       Has improved some.   Gastrointestinal: Positive for abdominal distention, abdominal pain, anorexia, blood in stool, diarrhea, nausea and vomiting. Negative for anal bleeding, constipation and rectal pain.  Neurological: Positive for dizziness, light-headedness (fingers) and numbness. Negative for headaches.        Objective    BP 111/77 (BP Location: Left Arm, Patient Position: Sitting, Cuff Size: Normal)   Pulse 88   Temp 97.8 F (36.6 C) (Oral)   Wt 165 lb (74.8 kg)   BMI 25.84 kg/m    Physical Exam Vitals reviewed.  Constitutional:      Appearance: Normal appearance.  HENT:     Head: Normocephalic and atraumatic.  Eyes:     Conjunctiva/sclera: Conjunctivae normal.     Pupils: Pupils are equal, round, and reactive to light.  Cardiovascular:     Rate and Rhythm: Normal rate and regular rhythm.     Pulses: Normal pulses.     Heart sounds: Normal heart sounds. No murmur heard. No friction rub. No gallop.   Pulmonary:     Effort: Pulmonary effort is normal. No respiratory distress.     Breath sounds: Normal breath sounds. No stridor. No wheezing or rales.  Abdominal:     General: Bowel sounds are normal.     Palpations: Abdomen is soft.     Tenderness: There is no abdominal tenderness.  Musculoskeletal:     Right lower leg: No edema.     Left lower leg: No edema.  Skin:    General: Skin is warm and dry.  Neurological:     General: No focal deficit present.     Mental Status: He is alert and oriented to person, place, and time.  Psychiatric:        Mood and Affect: Mood normal.        Behavior: Behavior normal.      No results found for any visits on 04/15/20.  Assessment & Plan     Problem List Items Addressed This Visit      Other   Alcohol  abuse   Relevant Orders   Ambulatory referral to Psychiatry    Other Visit Diagnoses    Hospital discharge follow-up    -  Primary   Relevant Medications   carvedilol (COREG) 25 MG tablet   Other Relevant Orders   Comprehensive metabolic panel   CBC with Differential   Generalized anxiety disorder       Relevant Medications   escitalopram (LEXAPRO) 10 MG tablet     Plan  Stop zoloft, start lexapro daily  Referral placed to psychiatry  Discussed options for alcohol cessation  Will follow up on lab results  Follow up with  GI on stools  Discussed how most of his symptoms are most likely alcohol related and importance of quitting  Return in about 2 months (around 06/15/2020).      Azalee Course Paige Vanderwoude, FNP  [facility] [phone] (phone) [phone] (fax)  [facility]

## 2020-04-15 NOTE — Patient Instructions (Signed)
Alcohol Abuse and Dependence Information, Adult Alcohol is a widely available drug. People drink alcohol in different amounts. People who drink alcohol very often and in large amounts often have problems during and after drinking. They may develop what is called an alcohol use disorder. There are two main types of alcohol use disorders:  Alcohol abuse. This is when you use alcohol too much or too often. You may use alcohol to make yourself feel happy or to reduce stress. You may have a hard time setting a limit on the amount you drink.  Alcohol dependence. This is when you use alcohol consistently for a period of time, and your body changes as a result. This can make it hard to stop drinking because you may start to feel sick or feel different when you do not use alcohol. These symptoms are known as withdrawal. How can alcohol abuse and dependence affect me? Alcohol abuse and dependence can have a negative effect on your life. Drinking too much can lead to addiction. You may feel like you need alcohol to function normally. You may drink alcohol before work in the morning, during the day, or as soon as you get home from work in the evening. These actions can result in:  Poor work performance.  Job loss.  Financial problems.  Car crashes or criminal charges from driving after drinking alcohol.  Problems in your relationships with friends and family.  Losing the trust and respect of coworkers, friends, and family. Drinking heavily over a long period of time can permanently damage your body and brain, and can cause lifelong health issues, such as:  Damage to your liver or pancreas.  Heart problems, high blood pressure, or stroke.  Certain cancers.  Decreased ability to fight infections.  Brain or nerve damage.  Depression.  Early (premature) death. If you are careless or you crave alcohol, it is easy to drink more than your body can handle (overdose). Alcohol overdose is a serious  situation that requires hospitalization. It may lead to permanent injuries or death. What can increase my risk?  Having a family history of alcohol abuse.  Having depression or other mental health conditions.  Beginning to drink at an early age.  Binge drinking often.  Experiencing trauma, stress, and an unstable home life during childhood.  Spending time with people who drink often. What actions can I take to prevent or manage alcohol abuse and dependence?  Do not drink alcohol if: ? Your health care provider tells you not to drink. ? You are pregnant, may be pregnant, or are planning to become pregnant.  If you drink alcohol: ? Limit how much you use to:  0-1 drink a day for women.  0-2 drinks a day for men. ? Be aware of how much alcohol is in your drink. In the [place] one drink equals one 12 oz bottle of beer (355 mL), one 5 oz glass of wine (148 mL), or one 1 oz glass of hard liquor (44 mL).  Stop drinking if you have been drinking too much. This can be very hard to do if you are used to abusing alcohol. If you begin to have withdrawal symptoms, talk with your health care provider or a person that you trust. These symptoms may include anxiety, shaky hands, headache, nausea, sweating, or not being able to sleep.  Choose to drink nonalcoholic beverages in social gatherings and places where there may be alcohol. Activity  Spend more time on activities that you enjoy that do   not involve alcohol, like hobbies or exercise.  Find healthy ways to cope with stress, such as exercise, meditation, or spending time with people you care about. General information  Talk to your family, coworkers, and friends about supporting you in your efforts to stop drinking. If they drink, ask them not to drink around you. Spend more time with people who do not drink alcohol.  If you think that you have an alcohol dependency problem: ? Tell friends or family about your concerns. ? Talk with your  health care provider or another health professional about where to get help. ? Work with a therapist and a chemical dependency counselor. ? Consider joining a support group for people who struggle with alcohol abuse and dependence. Where to find support  Your health care provider.  SMART Recovery: www.smartrecovery.org Therapy and support groups  Local treatment centers or chemical dependency counselors.  Local AA groups in your community: www.aa.org   Where to find more information  Centers for Disease Control and Prevention: www.cdc.gov  National Institute on Alcohol Abuse and Alcoholism: www.niaaa.nih.gov  Alcoholics Anonymous (AA): www.aa.org Contact a health care provider if:  You drank more or for longer than you intended on more than one occasion.  You tried to stop drinking or to cut back on how much you drink, but you were not able to.  You often drink to the point of vomiting or passing out.  You want to drink so badly that you cannot think about anything else.  You have problems in your life due to drinking, but you continue to drink.  You keep drinking even though you feel anxious, depressed, or have experienced memory loss.  You have stopped doing the things you used to enjoy in order to drink.  You have to drink more than you used to in order to get the effect you want.  You experience anxiety, sweating, nausea, shakiness, and trouble sleeping when you try to stop drinking. Get help right away if:  You have thoughts about hurting yourself or others.  You have serious withdrawal symptoms, including: ? Confusion. ? Racing heart. ? High blood pressure. ? Fever. If you ever feel like you may hurt yourself or others, or have thoughts about taking your own life, get help right away. You can go to your nearest emergency department or call:  Your local emergency services (911 in the [place]  A suicide crisis helpline, such as the National Suicide Prevention  Lifeline at 1-800-273-8255. This is open 24 hours a day. Summary  Alcohol abuse and dependence can have a negative effect on your life. Drinking too much or too often can lead to addiction.  If you drink alcohol, limit how much you use.  If you are having trouble keeping your drinking under control, find ways to change your behavior. Hobbies, calming activities, exercise, or support groups can help.  If you feel you need help with changing your drinking habits, talk with your health care provider, a good friend, or a therapist, or go to an AA group. This information is not intended to replace advice given to you by your health care provider. Make sure you discuss any questions you have with your health care provider. Document Revised: 04/11/2018 Document Reviewed: 02/28/2018 Elsevier Patient Education  2021 Elsevier Inc.  

## 2020-04-16 LAB — CBC WITH DIFFERENTIAL/PLATELET
Basophils Absolute: 0.1 10*3/uL (ref 0.0–0.2)
Basos: 1 %
EOS (ABSOLUTE): 0.1 10*3/uL (ref 0.0–0.4)
Eos: 1 %
Hematocrit: 37.8 % (ref 37.5–51.0)
Hemoglobin: 12.6 g/dL — ABNORMAL LOW (ref 13.0–17.7)
Immature Grans (Abs): 0 10*3/uL (ref 0.0–0.1)
Immature Granulocytes: 0 %
Lymphocytes Absolute: 1.9 10*3/uL (ref 0.7–3.1)
Lymphs: 33 %
MCH: 29.9 pg (ref 26.6–33.0)
MCHC: 33.3 g/dL (ref 31.5–35.7)
MCV: 90 fL (ref 79–97)
Monocytes Absolute: 0.4 10*3/uL (ref 0.1–0.9)
Monocytes: 7 %
Neutrophils Absolute: 3.2 10*3/uL (ref 1.4–7.0)
Neutrophils: 58 %
Platelets: 179 10*3/uL (ref 150–450)
RBC: 4.21 x10E6/uL (ref 4.14–5.80)
RDW: 11.9 % (ref 11.6–15.4)
WBC: 5.6 10*3/uL (ref 3.4–10.8)

## 2020-04-17 DIAGNOSIS — R7989 Other specified abnormal findings of blood chemistry: Secondary | ICD-10-CM | POA: Diagnosis not present

## 2020-04-22 LAB — HEMOCHROMATOSIS DNA-PCR(C282Y,H63D)

## 2020-04-24 ENCOUNTER — Other Ambulatory Visit: Payer: Self-pay | Admitting: *Deleted

## 2020-04-24 DIAGNOSIS — R7989 Other specified abnormal findings of blood chemistry: Secondary | ICD-10-CM

## 2020-04-24 NOTE — Progress Notes (Signed)
Referral placed to Hemetology for Dr. Cathie Hoops

## 2020-05-16 DIAGNOSIS — F411 Generalized anxiety disorder: Secondary | ICD-10-CM | POA: Diagnosis not present

## 2020-05-29 DIAGNOSIS — R079 Chest pain, unspecified: Secondary | ICD-10-CM | POA: Diagnosis not present

## 2020-06-16 ENCOUNTER — Encounter: Payer: Self-pay | Admitting: Family Medicine

## 2020-06-16 ENCOUNTER — Other Ambulatory Visit: Payer: Self-pay

## 2020-06-16 ENCOUNTER — Ambulatory Visit (INDEPENDENT_AMBULATORY_CARE_PROVIDER_SITE_OTHER): Payer: BC Managed Care – PPO | Admitting: Family Medicine

## 2020-06-16 VITALS — BP 116/98 | HR 84 | Temp 98.2°F | Resp 16 | Ht 67.0 in | Wt 170.0 lb

## 2020-06-16 DIAGNOSIS — R251 Tremor, unspecified: Secondary | ICD-10-CM

## 2020-06-16 DIAGNOSIS — F1010 Alcohol abuse, uncomplicated: Secondary | ICD-10-CM

## 2020-06-16 DIAGNOSIS — I426 Alcoholic cardiomyopathy: Secondary | ICD-10-CM

## 2020-06-16 DIAGNOSIS — F411 Generalized anxiety disorder: Secondary | ICD-10-CM | POA: Diagnosis not present

## 2020-06-16 DIAGNOSIS — Z720 Tobacco use: Secondary | ICD-10-CM | POA: Diagnosis not present

## 2020-06-16 MED ORDER — ESCITALOPRAM OXALATE 10 MG PO TABS: 20.0000 mg | ORAL_TABLET | Freq: Every day | ORAL | 1 refills | Status: DC

## 2020-06-16 NOTE — Progress Notes (Signed)
Established patient visit   Patient: Joshua Mayo   DOB: May 21, 1975   45 y.o. Male  MRN: [id number] Visit Date: 06/16/2020  Today's healthcare provider: Dortha Kern, PA-C   Chief Complaint  Patient presents with   Anxiety   Insomnia   Subjective    Insomnia Primary symptoms: sleep disturbance, difficulty falling asleep, frequent awakening, malaise/fatigue.   The current episode started more than one year. The onset quality is sudden. The problem occurs every several days. The problem has been waxing and waning since onset. The symptoms are aggravated by alcohol, anxiety, tobacco and SSRI use. How many beverages per day that contain caffeine: 0 - 1.  Past treatments include alcohol and meditation. The treatment provided mild relief. Typical bedtime:  10-11 P.M..  How long after going to bed to you fall asleep: over an hour.      Anxiety, Follow-up  He was last seen for anxiety 2 months ago. Changes made at last visit include stop Zoloft, start Lexapro daily.   He reports excellent compliance with treatment. He reports excellent tolerance of treatment. He is having side effects.   He feels his anxiety is mild and Improved since last visit.  Symptoms: Yes chest pain Yes difficulty concentrating  Yes dizziness Yes fatigue  Yes feelings of losing control Yes insomnia  No irritable Yes palpitations  No panic attacks No racing thoughts  No shortness of breath No sweating  Yes tremors/shakes    GAD-7 Results GAD-7 Generalized Anxiety Disorder Screening Tool 06/16/2020 11/22/2018  1. Feeling Nervous, Anxious, or on Edge 1 0  2. Not Being Able to Stop or Control Worrying 1 0  3. Worrying Too Much About Different Things 1 0  4. Trouble Relaxing 3 0  5. Being So Restless it's Hard To Sit Still 2 0  6. Becoming Easily Annoyed or Irritable 0 0  7. Feeling Afraid As If Something Awful Might Happen 1 0  Total GAD-7 Score 9 0  Difficulty At Work, Home, or Getting  Along  With Others? Somewhat difficult Not difficult at all    PHQ-9 Scores PHQ9 SCORE ONLY 06/16/2020 04/15/2020 11/01/2017  PHQ-9 Total Score 11 15 0    ---------------------------------------------------------------------------------------------------   Patient Active Problem List   Diagnosis Date Noted   Colon cancer screening    Erythema of colon    Rectal polyp    Abdominal pain    Gastric erythema    Chest pain 03/20/2020   Hypokalemia 03/20/2020   Hypertensive urgency 03/20/2020   Alcohol abuse 03/20/2020   Tobacco abuse 03/20/2020   Diarrhea 03/20/2020   Melena 03/20/2020   Unstable angina (HCC)    Hypertensive emergency    Elevated LFTs    Acute respiratory failure with hypoxia (HCC) 05/15/2018   Esophageal reflux 01/08/2016   Internal hemorrhoids without complication 01/08/2016   Snoring 01/08/2016   Past Surgical History:  Procedure Laterality Date   COLONOSCOPY WITH PROPOFOL N/A 04/03/2020   Procedure: COLONOSCOPY WITH PROPOFOL;  Surgeon: Pasty Spillers, MD;  Location: [facility];  Service: Endoscopy;  Laterality: N/A;   ESOPHAGOGASTRODUODENOSCOPY (EGD) WITH PROPOFOL N/A 04/03/2020   Procedure: ESOPHAGOGASTRODUODENOSCOPY (EGD) WITH PROPOFOL;  Surgeon: Pasty Spillers, MD;  Location: [facility];  Service: Endoscopy;  Laterality: N/A;   Family History  Problem Relation Age of Onset   Heart disease Father    Heart disease Paternal Grandfather     Social History   Tobacco Use   Smoking status: Every Day  Packs/day: 0.50    Years: 20.00    Pack years: 10.00    Types: Cigarettes   Smokeless tobacco: Never  Vaping Use   Vaping Use: Never used  Substance Use Topics   Alcohol use: Yes    Alcohol/week: 23.0 standard drinks    Types: 3 Cans of beer, 20 Standard drinks or equivalent per week    Comment: weekends pint to a 5th    Drug use: No    Types: Cocaine    Comment: previously used cocaine. Pt states he stopped on his own with the help  of his current wife    Allergies  Allergen Reactions   Banana Anaphylaxis       Medications: Outpatient Medications Prior to Visit  Medication Sig   aspirin EC 81 MG tablet Take 81 mg by mouth daily. Swallow whole.   carvedilol (COREG) 25 MG tablet Take 1 tablet (25 mg total) by mouth 2 (two) times daily with a meal.   ENTRESTO 24-26 MG Take 1 tablet by mouth 2 (two) times daily.   escitalopram (LEXAPRO) 10 MG tablet Take 1 tablet (10 mg total) by mouth daily.   spironolactone (ALDACTONE) 25 MG tablet Take 50 mg by mouth daily.   tadalafil (CIALIS) 5 MG tablet Take 5 mg by mouth daily.   No facility-administered medications prior to visit.    Review of Systems  Constitutional:  Positive for fatigue and malaise/fatigue. Negative for activity change and appetite change.  Respiratory:  Negative for chest tightness and shortness of breath.   Cardiovascular:  Positive for chest pain and palpitations.  Neurological:  Positive for tremors.  Psychiatric/Behavioral:  Positive for sleep disturbance. The patient is nervous/anxious and has insomnia.        Objective    BP (!) 116/98 (BP Location: Right Arm, Patient Position: Sitting, Cuff Size: Normal)   Pulse 84   Temp 98.2 F (36.8 C) (Oral)   Resp 16   Ht 5\' 7"  (1.702 m)   Wt 170 lb (77.1 kg)   SpO2 99%   BMI 26.63 kg/m  BP Readings from Last 3 Encounters:  06/16/20 (!) 116/98  04/15/20 111/77  04/03/20 (!) 129/99   Wt Readings from Last 3 Encounters:  06/16/20 170 lb (77.1 kg)  04/15/20 165 lb (74.8 kg)  04/03/20 164 lb (74.4 kg)     Physical Exam Constitutional:      General: He is not in acute distress.    Appearance: He is well-developed.  HENT:     Head: Normocephalic and atraumatic.     Right Ear: Hearing normal.     Left Ear: Hearing normal.     Nose: Nose normal.  Eyes:     General: Lids are normal. No scleral icterus.       Right eye: No discharge.        Left eye: No discharge.     Conjunctiva/sclera:  Conjunctivae normal.  Cardiovascular:     Rate and Rhythm: Normal rate and regular rhythm.     Pulses: Normal pulses.     Heart sounds: Normal heart sounds.  Pulmonary:     Effort: Pulmonary effort is normal. No respiratory distress.     Breath sounds: Normal breath sounds.  Abdominal:     General: Bowel sounds are normal.     Palpations: Abdomen is soft.  Musculoskeletal:        General: Normal range of motion.     Cervical back: Normal range of motion and neck  supple.  Skin:    Findings: No lesion or rash.  Neurological:     Mental Status: He is alert and oriented to person, place, and time.     Deep Tendon Reflexes: Reflexes abnormal.     Comments: Hyperreflexia  Psychiatric:        Speech: Speech normal.        Behavior: Behavior normal.        Thought Content: Thought content normal.      No results found for any visits on 06/16/20.  Assessment & Plan     1. Generalized anxiety disorder Difficulty getting to sleep or waking up around 2:00 am for an hour or two. Having marital problems and arrested for communicating threats to wife while intoxicated. Will increase Lexapro to 20 mg qd and may use Melatonin 1-3 mg hs prn for sleep. Recheck labs and follow up pending reports. - CBC with Differential/Platelet - Comprehensive metabolic panel - TSH - escitalopram (LEXAPRO) 10 MG tablet; Take 2 tablets (20 mg total) by mouth daily.  Dispense: 90 tablet; Refill: 1  2. Tobacco abuse Still smoking 1/2 ppd.  3. Alcohol abuse Still drinking 2-3 beers and a shot on average each day. Encouraged to continue counseling. Check routine labs. - Vitamin B1 - Comprehensive metabolic panel  4. Tremor Some minor tremor and shakes at night during sleep. Will check labs and encouraged to decrease more sleep. - Vitamin B1 - TSH  5. Cardiomyopathy, alcoholic (HCC) Continue Carvedilol, Entresto and Spironolactone with cardiology follow up (with Dr. Lady Gary) regularly. Recheck labs. - CBC  with Differential/Platelet - Comprehensive metabolic panel - TSH   No follow-ups on file.      I, Mona Ayars, PA-C, have reviewed all documentation for this visit. The documentation on 06/16/20 for the exam, diagnosis, procedures, and orders are all accurate and complete.    Dortha Kern, PA-C  [facility] [phone] (phone) [phone] (fax)  [facility]

## 2020-06-20 LAB — CBC WITH DIFFERENTIAL/PLATELET
Basophils Absolute: 0.1 10*3/uL (ref 0.0–0.2)
Basos: 1 %
EOS (ABSOLUTE): 0.1 10*3/uL (ref 0.0–0.4)
Eos: 2 %
Hematocrit: 37.8 % (ref 37.5–51.0)
Hemoglobin: 12.5 g/dL — ABNORMAL LOW (ref 13.0–17.7)
Immature Grans (Abs): 0 10*3/uL (ref 0.0–0.1)
Immature Granulocytes: 0 %
Lymphocytes Absolute: 2.3 10*3/uL (ref 0.7–3.1)
Lymphs: 42 %
MCH: 29.6 pg (ref 26.6–33.0)
MCHC: 33.1 g/dL (ref 31.5–35.7)
MCV: 90 fL (ref 79–97)
Monocytes Absolute: 0.4 10*3/uL (ref 0.1–0.9)
Monocytes: 8 %
Neutrophils Absolute: 2.6 10*3/uL (ref 1.4–7.0)
Neutrophils: 47 %
Platelets: 180 10*3/uL (ref 150–450)
RBC: 4.22 x10E6/uL (ref 4.14–5.80)
RDW: 12.2 % (ref 11.6–15.4)
WBC: 5.5 10*3/uL (ref 3.4–10.8)

## 2020-06-20 LAB — COMPREHENSIVE METABOLIC PANEL
ALT: 74 IU/L — ABNORMAL HIGH (ref 0–44)
AST: 54 IU/L — ABNORMAL HIGH (ref 0–40)
Albumin/Globulin Ratio: 2.4 — ABNORMAL HIGH (ref 1.2–2.2)
Albumin: 4.8 g/dL (ref 4.0–5.0)
Alkaline Phosphatase: 49 IU/L (ref 44–121)
BUN/Creatinine Ratio: 15 (ref 9–20)
BUN: 14 mg/dL (ref 6–24)
Bilirubin Total: 0.6 mg/dL (ref 0.0–1.2)
CO2: 22 mmol/L (ref 20–29)
Calcium: 9.5 mg/dL (ref 8.7–10.2)
Chloride: 98 mmol/L (ref 96–106)
Creatinine, Ser: 0.95 mg/dL (ref 0.76–1.27)
Globulin, Total: 2 g/dL (ref 1.5–4.5)
Glucose: 107 mg/dL — ABNORMAL HIGH (ref 65–99)
Potassium: 4 mmol/L (ref 3.5–5.2)
Sodium: 136 mmol/L (ref 134–144)
Total Protein: 6.8 g/dL (ref 6.0–8.5)
eGFR: 100 mL/min/{1.73_m2} (ref >59)

## 2020-06-20 LAB — VITAMIN B1: Thiamine: 80.8 nmol/L (ref 66.5–200.0)

## 2020-06-20 LAB — TSH: TSH: 1 u[IU]/mL (ref 0.450–4.500)

## 2020-06-23 DIAGNOSIS — F1020 Alcohol dependence, uncomplicated: Secondary | ICD-10-CM | POA: Diagnosis not present

## 2020-06-25 DIAGNOSIS — F1020 Alcohol dependence, uncomplicated: Secondary | ICD-10-CM | POA: Diagnosis not present

## 2020-06-27 DIAGNOSIS — F1020 Alcohol dependence, uncomplicated: Secondary | ICD-10-CM | POA: Diagnosis not present

## 2020-06-30 DIAGNOSIS — F1020 Alcohol dependence, uncomplicated: Secondary | ICD-10-CM | POA: Diagnosis not present

## 2020-07-02 DIAGNOSIS — F1020 Alcohol dependence, uncomplicated: Secondary | ICD-10-CM | POA: Diagnosis not present

## 2020-07-04 DIAGNOSIS — F1020 Alcohol dependence, uncomplicated: Secondary | ICD-10-CM | POA: Diagnosis not present

## 2020-07-09 DIAGNOSIS — F1020 Alcohol dependence, uncomplicated: Secondary | ICD-10-CM | POA: Diagnosis not present

## 2020-07-11 DIAGNOSIS — F1020 Alcohol dependence, uncomplicated: Secondary | ICD-10-CM | POA: Diagnosis not present

## 2020-07-14 DIAGNOSIS — F1020 Alcohol dependence, uncomplicated: Secondary | ICD-10-CM | POA: Diagnosis not present

## 2020-07-16 DIAGNOSIS — F1020 Alcohol dependence, uncomplicated: Secondary | ICD-10-CM | POA: Diagnosis not present

## 2020-07-17 DIAGNOSIS — F411 Generalized anxiety disorder: Secondary | ICD-10-CM | POA: Diagnosis not present

## 2020-07-18 DIAGNOSIS — F1020 Alcohol dependence, uncomplicated: Secondary | ICD-10-CM | POA: Diagnosis not present

## 2020-07-23 DIAGNOSIS — F1020 Alcohol dependence, uncomplicated: Secondary | ICD-10-CM | POA: Diagnosis not present

## 2020-07-25 DIAGNOSIS — F1020 Alcohol dependence, uncomplicated: Secondary | ICD-10-CM | POA: Diagnosis not present

## 2020-07-28 DIAGNOSIS — F1020 Alcohol dependence, uncomplicated: Secondary | ICD-10-CM | POA: Diagnosis not present

## 2020-07-30 DIAGNOSIS — F1020 Alcohol dependence, uncomplicated: Secondary | ICD-10-CM | POA: Diagnosis not present

## 2020-08-14 DIAGNOSIS — F411 Generalized anxiety disorder: Secondary | ICD-10-CM | POA: Diagnosis not present

## 2020-09-11 DIAGNOSIS — I426 Alcoholic cardiomyopathy: Secondary | ICD-10-CM | POA: Diagnosis not present

## 2020-09-11 DIAGNOSIS — I200 Unstable angina: Secondary | ICD-10-CM | POA: Diagnosis not present

## 2020-09-11 DIAGNOSIS — I5020 Unspecified systolic (congestive) heart failure: Secondary | ICD-10-CM | POA: Diagnosis not present

## 2020-09-11 DIAGNOSIS — I161 Hypertensive emergency: Secondary | ICD-10-CM | POA: Diagnosis not present

## 2020-10-02 ENCOUNTER — Other Ambulatory Visit: Payer: Self-pay | Admitting: Family Medicine

## 2020-10-02 DIAGNOSIS — F411 Generalized anxiety disorder: Secondary | ICD-10-CM

## 2020-10-02 MED ORDER — ESCITALOPRAM OXALATE 10 MG PO TABS: 20.0000 mg | ORAL_TABLET | Freq: Every day | ORAL | 0 refills | Status: DC

## 2020-10-02 NOTE — Telephone Encounter (Signed)
Medication Refill - Medication: escitalopram (LEXAPRO) 10 MG tablet   Has the patient contacted their pharmacy? Yes.   (Agent: If no, request that the patient contact the pharmacy for the refill.) (Agent: If yes, when and what did the pharmacy advise?)  Preferred Pharmacy (with phone number or street name):  [facility] [address] [place] - [address]  [address] [place] [address]  Phone: [phone] Fax: [phone]   Has the patient been seen for an appointment in the last year OR does the patient have an upcoming appointment? Yes.    Agent: Please be advised that RX refills may take up to 3 business days. We ask that you follow-up with your pharmacy.

## 2020-10-09 ENCOUNTER — Other Ambulatory Visit: Payer: Self-pay | Admitting: Family Medicine

## 2020-10-09 DIAGNOSIS — F411 Generalized anxiety disorder: Secondary | ICD-10-CM

## 2020-10-09 DIAGNOSIS — F1020 Alcohol dependence, uncomplicated: Secondary | ICD-10-CM | POA: Diagnosis not present

## 2020-10-10 ENCOUNTER — Other Ambulatory Visit: Payer: Self-pay | Admitting: Family Medicine

## 2020-10-10 ENCOUNTER — Ambulatory Visit: Payer: Self-pay

## 2020-10-10 DIAGNOSIS — F411 Generalized anxiety disorder: Secondary | ICD-10-CM

## 2020-10-10 DIAGNOSIS — F1020 Alcohol dependence, uncomplicated: Secondary | ICD-10-CM | POA: Diagnosis not present

## 2020-10-10 NOTE — Telephone Encounter (Signed)
Pt was follow up on this message.  Please advise.   Thanks,   -Vernona Rieger

## 2020-10-10 NOTE — Telephone Encounter (Signed)
See triage encounter note, insurance will only pay for Lexapro 20 mg pill instead of the ordered 10 mg.

## 2020-10-10 NOTE — Telephone Encounter (Signed)
Pt called back and   he said he could get really sick if he goes completely off the medication.  He is wanting to get the refill today if possible.  She states his mother is a doctor and she says he really needs to get back on the medication.  CB#  [phone]

## 2020-10-10 NOTE — Telephone Encounter (Signed)
Requested medication (s) are due for refill today - no  Requested medication (s) are on the active medication list -yes  Future visit scheduled -no  Last refill: 10/09/20  Notes to clinic: Pharmacy request changes to Rx- please review   Requested Prescriptions  Pending Prescriptions Disp Refills   escitalopram (LEXAPRO) 20 MG tablet [Pharmacy Med Name: ESCITALOPRAM 20 MG TABLET] 90 tablet 0    Sig: QT BY MOUTH EVERY DAY     Psychiatry:  Antidepressants - SSRI Passed - 10/09/2020  6:51 PM      Passed - Valid encounter within last 6 months    Recent Outpatient Visits           3 months ago Generalized anxiety disorder   [facility], Jodell Cipro, PA-C   5 months ago Hospital discharge follow-up   [facility], Azalee Course, FNP   9 months ago Generalized anxiety disorder   [facility] [facility], Jodell Cipro, PA-C   2 years ago New onset of congestive heart failure [facility])   [employer], Jodell Cipro, PA-C   2 years ago Flu-like symptoms   [employer] [facility], Jodell Cipro, PA-C                 Requested Prescriptions  Pending Prescriptions Disp Refills   escitalopram (LEXAPRO) 20 MG tablet [Pharmacy Med Name: ESCITALOPRAM 20 MG TABLET] 90 tablet 0    Sig: QT BY MOUTH EVERY DAY     Psychiatry:  Antidepressants - SSRI Passed - 10/09/2020  6:51 PM      Passed - Valid encounter within last 6 months    Recent Outpatient Visits           3 months ago Generalized anxiety disorder   [facility] [facility], Jodell Cipro, PA-C   5 months ago Hospital discharge follow-up   [facility] Just, Azalee Course, FNP   9 months ago Generalized anxiety disorder   [facility] [facility], Jodell Cipro, PA-C   2 years ago New onset of congestive heart failure [facility])   [employer] [facility], Jodell Cipro, PA-C   2 years ago Flu-like symptoms   [employer], Jodell Cipro, [place]

## 2020-10-10 NOTE — Telephone Encounter (Signed)
Patient called and says he's been out of his Lexapro for about a week, last dose he remembers is last Saturday. He says he started on Monday with symptoms of headache, dizziness, insomnia, then on Wednesday he had nausea and vomiting all day. He said he read up on Lexapro and realized he was having withdrawal symptoms from not taking the medication. He went to the pharmacy to pick it up that was sent in Lexapro 10 mg 2 tabs daily and the pharmacist said the insurance is wanting him to take a 20 mg tablet, so the pharmacist sent over a request and it was refused. Today the pharmacist sent another request for Lexapro 20 mg daily so the insurance will pay. He says he is still having the headache and dizziness (moderate) and insomnia, no more nausea and vomiting. I advised a virtual visit due to the symptoms, no availability today and the office is closed for lunch. I advised I will send this to Merita Norton the provider in the office and someone will call back with her recommendations, advised if symptoms worsen to go to the UC. Patient verbalized understanding of above and care instructions given. CB [phone].   Reason for Disposition  [1] Caller has URGENT medicine question about med that PCP or specialist prescribed AND [2] triager unable to answer question  Answer Assessment - Initial Assessment Questions 1. NAME of MEDICATION: "What medicine are you calling about?"     Lexapro 2. QUESTION: "What is your question?" (e.g., double dose of medicine, side effect)     Out x 1 week, having w/d symptoms 3. PRESCRIBING HCP: "Who prescribed it?" Reason: if prescribed by specialist, call should be referred to that group.     [facility]ie Just 4. SYMPTOMS: "Do you have any symptoms?"     Nausea, vomiting, headache, dizziness, cold sweats, insominia 5. SEVERITY: If symptoms are present, ask "Are they mild, moderate or severe?"     Moderate 6. PREGNANCY:  "Is there any chance that you are pregnant?" "When was your  last menstrual period?"     N/A  Protocols used: Medication Question Call-A-AH

## 2020-10-11 NOTE — Telephone Encounter (Signed)
Pharmacy comment: Alternative Requested:INSURANCE WILL ONLY PAY FOR 1 PER DAY. SEND RX FOR 20 MG TAB.

## 2020-10-13 DIAGNOSIS — F1020 Alcohol dependence, uncomplicated: Secondary | ICD-10-CM | POA: Diagnosis not present

## 2020-10-14 MED ORDER — ESCITALOPRAM OXALATE 20 MG PO TABS: 20.0000 mg | ORAL_TABLET | Freq: Every day | ORAL | 0 refills | Status: DC

## 2020-10-14 NOTE — Addendum Note (Signed)
Addended by: Fonda Kinder on: 10/14/2020 08:29 AM   Modules accepted: Orders

## 2020-10-14 NOTE — Telephone Encounter (Signed)
Spoke with pharmacist insurance will not pay for 10mg  BID he states that insurance will pay for one pill a day. Pharmacist request you change prescription to 20mg  tablet, okay to d/c 10mg  dose and change to 20mg ?  KW

## 2020-10-14 NOTE — Addendum Note (Signed)
Addended by: Wilford Corner on: 10/14/2020 09:59 AM   Modules accepted: Orders

## 2020-10-14 NOTE — Addendum Note (Signed)
Addended by: Wilford Corner on: 10/14/2020 10:02 AM   Modules accepted: Orders

## 2020-10-14 NOTE — Telephone Encounter (Addendum)
Patient called and spoke to the [facility] Agent Joshua Mayo about the refill and says he called the pharmacy and they have not received anything from the provider for Lexapro 20 mg. I advised it was sent in today, looks like a phone in. I advised I will submit electronically and for him to check back at the pharmacy in about 30 minutes, if not received to call the office back. Agent says he said ok.   Requested Prescriptions  Pending Prescriptions Disp Refills   escitalopram (LEXAPRO) 20 MG tablet 90 tablet 0    Sig: Take 1 tablet (20 mg total) by mouth daily.     There is no refill protocol information for this order    Signed Prescriptions Disp Refills   escitalopram (LEXAPRO) 20 MG tablet 90 tablet 0    Sig: Take 1 tablet (20 mg total) by mouth daily.     There is no refill protocol information for this order

## 2020-10-15 DIAGNOSIS — F1020 Alcohol dependence, uncomplicated: Secondary | ICD-10-CM | POA: Diagnosis not present

## 2020-10-16 DIAGNOSIS — F1020 Alcohol dependence, uncomplicated: Secondary | ICD-10-CM | POA: Diagnosis not present

## 2020-10-17 DIAGNOSIS — F1020 Alcohol dependence, uncomplicated: Secondary | ICD-10-CM | POA: Diagnosis not present

## 2020-10-20 DIAGNOSIS — F1020 Alcohol dependence, uncomplicated: Secondary | ICD-10-CM | POA: Diagnosis not present

## 2020-10-22 DIAGNOSIS — F1020 Alcohol dependence, uncomplicated: Secondary | ICD-10-CM | POA: Diagnosis not present

## 2020-10-24 DIAGNOSIS — F1020 Alcohol dependence, uncomplicated: Secondary | ICD-10-CM | POA: Diagnosis not present

## 2020-10-27 DIAGNOSIS — F1020 Alcohol dependence, uncomplicated: Secondary | ICD-10-CM | POA: Diagnosis not present

## 2020-10-29 DIAGNOSIS — F1020 Alcohol dependence, uncomplicated: Secondary | ICD-10-CM | POA: Diagnosis not present

## 2020-10-31 DIAGNOSIS — F1020 Alcohol dependence, uncomplicated: Secondary | ICD-10-CM | POA: Diagnosis not present

## 2020-11-01 DIAGNOSIS — F1020 Alcohol dependence, uncomplicated: Secondary | ICD-10-CM | POA: Diagnosis not present

## 2020-11-03 DIAGNOSIS — F1020 Alcohol dependence, uncomplicated: Secondary | ICD-10-CM | POA: Diagnosis not present

## 2020-11-05 DIAGNOSIS — F1020 Alcohol dependence, uncomplicated: Secondary | ICD-10-CM | POA: Diagnosis not present

## 2020-11-06 DIAGNOSIS — F1020 Alcohol dependence, uncomplicated: Secondary | ICD-10-CM | POA: Diagnosis not present

## 2020-11-07 DIAGNOSIS — F1020 Alcohol dependence, uncomplicated: Secondary | ICD-10-CM | POA: Diagnosis not present

## 2020-11-10 DIAGNOSIS — F1020 Alcohol dependence, uncomplicated: Secondary | ICD-10-CM | POA: Diagnosis not present

## 2020-11-12 DIAGNOSIS — F1020 Alcohol dependence, uncomplicated: Secondary | ICD-10-CM | POA: Diagnosis not present

## 2020-11-13 DIAGNOSIS — F1020 Alcohol dependence, uncomplicated: Secondary | ICD-10-CM | POA: Diagnosis not present

## 2020-11-14 DIAGNOSIS — F1020 Alcohol dependence, uncomplicated: Secondary | ICD-10-CM | POA: Diagnosis not present

## 2020-11-17 DIAGNOSIS — F1020 Alcohol dependence, uncomplicated: Secondary | ICD-10-CM | POA: Diagnosis not present

## 2020-11-19 DIAGNOSIS — F1020 Alcohol dependence, uncomplicated: Secondary | ICD-10-CM | POA: Diagnosis not present

## 2020-11-20 DIAGNOSIS — F1020 Alcohol dependence, uncomplicated: Secondary | ICD-10-CM | POA: Diagnosis not present

## 2020-11-21 DIAGNOSIS — F1020 Alcohol dependence, uncomplicated: Secondary | ICD-10-CM | POA: Diagnosis not present

## 2020-11-24 DIAGNOSIS — F1020 Alcohol dependence, uncomplicated: Secondary | ICD-10-CM | POA: Diagnosis not present

## 2020-11-26 DIAGNOSIS — F1020 Alcohol dependence, uncomplicated: Secondary | ICD-10-CM | POA: Diagnosis not present

## 2020-11-28 DIAGNOSIS — F1020 Alcohol dependence, uncomplicated: Secondary | ICD-10-CM | POA: Diagnosis not present

## 2020-12-01 DIAGNOSIS — F1020 Alcohol dependence, uncomplicated: Secondary | ICD-10-CM | POA: Diagnosis not present

## 2020-12-03 DIAGNOSIS — F1020 Alcohol dependence, uncomplicated: Secondary | ICD-10-CM | POA: Diagnosis not present

## 2020-12-04 DIAGNOSIS — F1020 Alcohol dependence, uncomplicated: Secondary | ICD-10-CM | POA: Diagnosis not present

## 2020-12-05 DIAGNOSIS — F1020 Alcohol dependence, uncomplicated: Secondary | ICD-10-CM | POA: Diagnosis not present

## 2020-12-08 DIAGNOSIS — F1020 Alcohol dependence, uncomplicated: Secondary | ICD-10-CM | POA: Diagnosis not present

## 2020-12-10 DIAGNOSIS — F1020 Alcohol dependence, uncomplicated: Secondary | ICD-10-CM | POA: Diagnosis not present

## 2020-12-11 DIAGNOSIS — F1020 Alcohol dependence, uncomplicated: Secondary | ICD-10-CM | POA: Diagnosis not present

## 2020-12-12 DIAGNOSIS — F1020 Alcohol dependence, uncomplicated: Secondary | ICD-10-CM | POA: Diagnosis not present

## 2020-12-18 DIAGNOSIS — F1020 Alcohol dependence, uncomplicated: Secondary | ICD-10-CM | POA: Diagnosis not present

## 2020-12-22 DIAGNOSIS — F1020 Alcohol dependence, uncomplicated: Secondary | ICD-10-CM | POA: Diagnosis not present

## 2021-01-06 DIAGNOSIS — F1020 Alcohol dependence, uncomplicated: Secondary | ICD-10-CM | POA: Diagnosis not present

## 2021-01-13 DIAGNOSIS — F1020 Alcohol dependence, uncomplicated: Secondary | ICD-10-CM | POA: Diagnosis not present

## 2021-01-15 ENCOUNTER — Emergency Department: Payer: BC Managed Care – PPO

## 2021-01-15 ENCOUNTER — Inpatient Hospital Stay: Payer: BC Managed Care – PPO

## 2021-01-15 ENCOUNTER — Other Ambulatory Visit: Payer: Self-pay

## 2021-01-15 ENCOUNTER — Inpatient Hospital Stay
Admission: EM | Admit: 2021-01-15 | Discharge: 2021-01-20 | DRG: 101 | Disposition: A | Discharge status: Home / Self Care | Payer: BC Managed Care – PPO | Attending: Internal Medicine | Admitting: Internal Medicine

## 2021-01-15 DIAGNOSIS — I959 Hypotension, unspecified: Secondary | ICD-10-CM | POA: Diagnosis present

## 2021-01-15 DIAGNOSIS — F17210 Nicotine dependence, cigarettes, uncomplicated: Secondary | ICD-10-CM | POA: Diagnosis present

## 2021-01-15 DIAGNOSIS — D638 Anemia in other chronic diseases classified elsewhere: Secondary | ICD-10-CM | POA: Diagnosis not present

## 2021-01-15 DIAGNOSIS — Z7982 Long term (current) use of aspirin: Secondary | ICD-10-CM | POA: Diagnosis not present

## 2021-01-15 DIAGNOSIS — I629 Nontraumatic intracranial hemorrhage, unspecified: Secondary | ICD-10-CM | POA: Diagnosis not present

## 2021-01-15 DIAGNOSIS — M542 Cervicalgia: Secondary | ICD-10-CM | POA: Diagnosis not present

## 2021-01-15 DIAGNOSIS — S299XXA Unspecified injury of thorax, initial encounter: Secondary | ICD-10-CM | POA: Diagnosis not present

## 2021-01-15 DIAGNOSIS — Z8249 Family history of ischemic heart disease and other diseases of the circulatory system: Secondary | ICD-10-CM

## 2021-01-15 DIAGNOSIS — N179 Acute kidney failure, unspecified: Secondary | ICD-10-CM | POA: Diagnosis not present

## 2021-01-15 DIAGNOSIS — E872 Acidosis, unspecified: Secondary | ICD-10-CM | POA: Diagnosis present

## 2021-01-15 DIAGNOSIS — R52 Pain, unspecified: Secondary | ICD-10-CM

## 2021-01-15 DIAGNOSIS — Z20822 Contact with and (suspected) exposure to covid-19: Secondary | ICD-10-CM | POA: Diagnosis present

## 2021-01-15 DIAGNOSIS — I5022 Chronic systolic (congestive) heart failure: Secondary | ICD-10-CM | POA: Diagnosis not present

## 2021-01-15 DIAGNOSIS — R4182 Altered mental status, unspecified: Secondary | ICD-10-CM | POA: Diagnosis not present

## 2021-01-15 DIAGNOSIS — I6930 Unspecified sequelae of cerebral infarction: Secondary | ICD-10-CM | POA: Diagnosis not present

## 2021-01-15 DIAGNOSIS — S0990XA Unspecified injury of head, initial encounter: Secondary | ICD-10-CM | POA: Diagnosis not present

## 2021-01-15 DIAGNOSIS — I426 Alcoholic cardiomyopathy: Secondary | ICD-10-CM | POA: Diagnosis present

## 2021-01-15 DIAGNOSIS — I672 Cerebral atherosclerosis: Secondary | ICD-10-CM | POA: Diagnosis not present

## 2021-01-15 DIAGNOSIS — Z720 Tobacco use: Secondary | ICD-10-CM | POA: Diagnosis present

## 2021-01-15 DIAGNOSIS — J690 Pneumonitis due to inhalation of food and vomit: Secondary | ICD-10-CM | POA: Diagnosis present

## 2021-01-15 DIAGNOSIS — R569 Unspecified convulsions: Principal | ICD-10-CM

## 2021-01-15 DIAGNOSIS — I110 Hypertensive heart disease with heart failure: Secondary | ICD-10-CM | POA: Diagnosis present

## 2021-01-15 DIAGNOSIS — F1010 Alcohol abuse, uncomplicated: Secondary | ICD-10-CM | POA: Diagnosis present

## 2021-01-15 DIAGNOSIS — S199XXA Unspecified injury of neck, initial encounter: Secondary | ICD-10-CM | POA: Diagnosis not present

## 2021-01-15 DIAGNOSIS — I6389 Other cerebral infarction: Secondary | ICD-10-CM | POA: Diagnosis not present

## 2021-01-15 DIAGNOSIS — F10239 Alcohol dependence with withdrawal, unspecified: Secondary | ICD-10-CM | POA: Diagnosis present

## 2021-01-15 DIAGNOSIS — I63233 Cerebral infarction due to unspecified occlusion or stenosis of bilateral carotid arteries: Secondary | ICD-10-CM | POA: Diagnosis not present

## 2021-01-15 DIAGNOSIS — J9811 Atelectasis: Secondary | ICD-10-CM | POA: Diagnosis not present

## 2021-01-15 HISTORY — DX: Unspecified convulsions: R56.9

## 2021-01-15 LAB — LACTIC ACID, PLASMA
Lactic Acid, Venous: 3.5 mmol/L (ref 0.5–1.9)
Lactic Acid, Venous: 2.4 mmol/L (ref 0.5–1.9)
Lactic Acid, Venous: 2.4 mmol/L (ref 0.5–1.9)

## 2021-01-15 LAB — RESP PANEL BY RT-PCR (FLU A&B, COVID) ARPGX2
Influenza A by PCR: NEGATIVE
Influenza B by PCR: NEGATIVE
SARS Coronavirus 2 by RT PCR: NEGATIVE

## 2021-01-15 LAB — COMPREHENSIVE METABOLIC PANEL
ALT: 24 U/L (ref 0–44)
AST: 23 U/L (ref 15–41)
Albumin: 4.1 g/dL (ref 3.5–5.0)
Alkaline Phosphatase: 39 U/L (ref 38–126)
Anion gap: 16 — ABNORMAL HIGH (ref 5–15)
BUN: 31 mg/dL — ABNORMAL HIGH (ref 6–20)
CO2: 19 mmol/L — ABNORMAL LOW (ref 22–32)
Calcium: 9.1 mg/dL (ref 8.9–10.3)
Chloride: 105 mmol/L (ref 98–111)
Creatinine, Ser: 1.88 mg/dL — ABNORMAL HIGH (ref 0.61–1.24)
GFR, Estimated: 44 mL/min — ABNORMAL LOW (ref >60)
Glucose, Bld: 125 mg/dL — ABNORMAL HIGH (ref 70–99)
Potassium: 3.9 mmol/L (ref 3.5–5.1)
Sodium: 140 mmol/L (ref 135–145)
Total Bilirubin: 0.4 mg/dL (ref 0.3–1.2)
Total Protein: 8 g/dL (ref 6.5–8.1)

## 2021-01-15 LAB — CBC
HCT: 40.3 % (ref 39.0–52.0)
Hemoglobin: 13 g/dL (ref 13.0–17.0)
MCH: 30.9 pg (ref 26.0–34.0)
MCHC: 32.3 g/dL (ref 30.0–36.0)
MCV: 95.7 fL (ref 80.0–100.0)
Platelets: 229 10*3/uL (ref 150–400)
RBC: 4.21 MIL/uL — ABNORMAL LOW (ref 4.22–5.81)
RDW: 12.9 % (ref 11.5–15.5)
WBC: 8.1 10*3/uL (ref 4.0–10.5)
nRBC: 0 % (ref 0.0–0.2)

## 2021-01-15 LAB — CK: Total CK: 66 U/L (ref 49–397)

## 2021-01-15 LAB — TROPONIN I (HIGH SENSITIVITY)
Troponin I (High Sensitivity): 7 ng/L (ref <18)
Troponin I (High Sensitivity): 6 ng/L (ref <18)

## 2021-01-15 LAB — ETHANOL: Alcohol, Ethyl (B): 73 mg/dL — ABNORMAL HIGH (ref <10)

## 2021-01-15 LAB — MAGNESIUM: Magnesium: 2 mg/dL (ref 1.7–2.4)

## 2021-01-15 LAB — CBG MONITORING, ED: Glucose-Capillary: 125 mg/dL — ABNORMAL HIGH (ref 70–99)

## 2021-01-15 MED ORDER — MIDAZOLAM HCL 2 MG/2ML IJ SOLN
4.0000 mg | Freq: Once | INTRAMUSCULAR | Status: DC
  Filled 2021-01-15: qty 4

## 2021-01-15 MED ORDER — ACETAMINOPHEN 325 MG PO TABS
650.0000 mg | ORAL_TABLET | ORAL | Status: DC | PRN
  Administered 2021-01-16 – 2021-01-20 (×6): 650 mg via ORAL
  Filled 2021-01-15 (×7): qty 2

## 2021-01-15 MED ORDER — ONDANSETRON HCL 4 MG PO TABS: 4.0000 mg | ORAL_TABLET | Freq: Four times a day (QID) | ORAL | Status: DC | PRN

## 2021-01-15 MED ORDER — LACTATED RINGERS IV BOLUS
1000.0000 mL | Freq: Once | INTRAVENOUS | Status: AC
  Administered 2021-01-15: 1000 mL via INTRAVENOUS

## 2021-01-15 MED ORDER — ASPIRIN EC 81 MG PO TBEC
81.0000 mg | DELAYED_RELEASE_TABLET | Freq: Every day | ORAL | Status: DC
Start: 2021-01-16 — End: 2021-01-20
  Administered 2021-01-16 – 2021-01-19 (×4): 81 mg via ORAL
  Filled 2021-01-15 (×4): qty 1

## 2021-01-15 MED ORDER — ADULT MULTIVITAMIN W/MINERALS CH
1.0000 | ORAL_TABLET | Freq: Every day | ORAL | Status: DC
  Administered 2021-01-16 – 2021-01-19 (×4): 1 via ORAL
  Filled 2021-01-15 (×4): qty 1

## 2021-01-15 MED ORDER — LORAZEPAM 2 MG/ML IJ SOLN: 4.0000 mg | INTRAMUSCULAR | Status: DC | PRN

## 2021-01-15 MED ORDER — LORAZEPAM 2 MG PO TABS
0.0000 mg | ORAL_TABLET | Freq: Four times a day (QID) | ORAL | Status: AC
  Administered 2021-01-16: 10:00:00 1 mg via ORAL
  Administered 2021-01-16 – 2021-01-17 (×2): 2 mg via ORAL
  Filled 2021-01-15 (×2): qty 1
  Filled 2021-01-15: qty 2

## 2021-01-15 MED ORDER — ACAMPROSATE CALCIUM 333 MG PO TBEC
666.0000 mg | DELAYED_RELEASE_TABLET | Freq: Three times a day (TID) | ORAL | Status: DC
  Administered 2021-01-16 – 2021-01-20 (×12): 666 mg via ORAL
  Filled 2021-01-15 (×17): qty 2

## 2021-01-15 MED ORDER — LEVETIRACETAM IN NACL 1500 MG/100ML IV SOLN
1500.0000 mg | Freq: Once | INTRAVENOUS | Status: AC
  Administered 2021-01-15: 1500 mg via INTRAVENOUS
  Filled 2021-01-15: qty 100

## 2021-01-15 MED ORDER — CHLORHEXIDINE GLUCONATE 0.12 % MT SOLN
15.0000 mL | Freq: Two times a day (BID) | OROMUCOSAL | Status: DC
  Administered 2021-01-15 – 2021-01-20 (×10): 15 mL via OROMUCOSAL
  Filled 2021-01-15 (×10): qty 15

## 2021-01-15 MED ORDER — NICOTINE 21 MG/24HR TD PT24
21.0000 mg | MEDICATED_PATCH | Freq: Every day | TRANSDERMAL | Status: DC
  Administered 2021-01-16 – 2021-01-19 (×5): 21 mg via TRANSDERMAL
  Filled 2021-01-15 (×5): qty 1

## 2021-01-15 MED ORDER — LEVETIRACETAM IN NACL 1000 MG/100ML IV SOLN
1000.0000 mg | Freq: Two times a day (BID) | INTRAVENOUS | Status: DC
  Administered 2021-01-16 (×2): 1000 mg via INTRAVENOUS
  Filled 2021-01-15 (×3): qty 100

## 2021-01-15 MED ORDER — SODIUM CHLORIDE 0.9 % IV SOLN
3.0000 g | Freq: Four times a day (QID) | INTRAVENOUS | Status: DC
  Administered 2021-01-16 (×2): 3 g via INTRAVENOUS
  Filled 2021-01-15: qty 8
  Filled 2021-01-15 (×2): qty 3
  Filled 2021-01-15 (×2): qty 8

## 2021-01-15 MED ORDER — THIAMINE HCL 100 MG PO TABS
100.0000 mg | ORAL_TABLET | Freq: Every day | ORAL | Status: DC
  Administered 2021-01-16 – 2021-01-19 (×4): 100 mg via ORAL
  Filled 2021-01-15 (×4): qty 1

## 2021-01-15 MED ORDER — FOLIC ACID 1 MG PO TABS
1.0000 mg | ORAL_TABLET | Freq: Every day | ORAL | Status: DC
  Administered 2021-01-16 – 2021-01-19 (×4): 1 mg via ORAL
  Filled 2021-01-15 (×4): qty 1

## 2021-01-15 MED ORDER — THIAMINE HCL 100 MG/ML IJ SOLN
Freq: Once | INTRAVENOUS | Status: AC
  Filled 2021-01-15: qty 1000

## 2021-01-15 MED ORDER — ONDANSETRON HCL 4 MG/2ML IJ SOLN: 4.0000 mg | Freq: Four times a day (QID) | INTRAMUSCULAR | Status: DC | PRN

## 2021-01-15 MED ORDER — ACETAMINOPHEN 650 MG RE SUPP: 650.0000 mg | RECTAL | Status: DC | PRN

## 2021-01-15 MED ORDER — SODIUM CHLORIDE 0.9 % IV BOLUS
1000.0000 mL | Freq: Once | INTRAVENOUS | Status: AC
Start: 2021-01-15 — End: 2021-01-15
  Administered 2021-01-15: 1000 mL via INTRAVENOUS

## 2021-01-15 MED ORDER — LORAZEPAM 1 MG PO TABS
1.0000 mg | ORAL_TABLET | ORAL | Status: AC | PRN
  Filled 2021-01-15: qty 1

## 2021-01-15 MED ORDER — NICOTINE 21 MG/24HR TD PT24: 21.0000 mg | MEDICATED_PATCH | TRANSDERMAL | 1 refills | Status: AC

## 2021-01-15 MED ORDER — LACTATED RINGERS IV SOLN
INTRAVENOUS | Status: DC
  Administered 2021-01-15: 18:00:00 1000 mL via INTRAVENOUS

## 2021-01-15 MED ORDER — MIDAZOLAM HCL 2 MG/2ML IJ SOLN
2.0000 mg | Freq: Once | INTRAMUSCULAR | Status: AC
  Administered 2021-01-15: 2 mg via INTRAVENOUS

## 2021-01-15 MED ORDER — LORAZEPAM 2 MG PO TABS
0.0000 mg | ORAL_TABLET | Freq: Two times a day (BID) | ORAL | Status: AC
  Administered 2021-01-17 – 2021-01-18 (×3): 2 mg via ORAL
  Administered 2021-01-19: 1 mg via ORAL
  Filled 2021-01-15 (×4): qty 1

## 2021-01-15 MED ORDER — LORAZEPAM 2 MG/ML IJ SOLN
1.0000 mg | INTRAMUSCULAR | Status: AC | PRN
  Administered 2021-01-15 – 2021-01-16 (×3): 2 mg via INTRAVENOUS
  Filled 2021-01-15 (×3): qty 1

## 2021-01-15 MED ORDER — ESCITALOPRAM OXALATE 20 MG PO TABS
20.0000 mg | ORAL_TABLET | Freq: Every day | ORAL | Status: DC
  Administered 2021-01-16 – 2021-01-19 (×4): 20 mg via ORAL
  Filled 2021-01-15 (×5): qty 1

## 2021-01-15 MED ORDER — ORAL CARE MOUTH RINSE
15.0000 mL | OROMUCOSAL | Status: DC
  Administered 2021-01-16: 15 mL via OROMUCOSAL
  Filled 2021-01-15 (×5): qty 15

## 2021-01-15 MED ORDER — SODIUM CHLORIDE 0.9 % IV SOLN
3.0000 g | Freq: Once | INTRAVENOUS | Status: AC
  Administered 2021-01-15: 3 g via INTRAVENOUS
  Filled 2021-01-15: qty 8

## 2021-01-15 MED ORDER — THIAMINE HCL 100 MG/ML IJ SOLN: 100.0000 mg | Freq: Every day | INTRAMUSCULAR | Status: DC

## 2021-01-15 NOTE — ED Triage Notes (Signed)
Pt has another witnessed sz. Ivs x 2 placed, orders for IVF and versed given by MD Roxan Hockey

## 2021-01-15 NOTE — ED Provider Notes (Signed)
----------------------------------------- °  3:05 PM on 01/15/2021 -----------------------------------------  Blood pressure (!) 70/50, pulse 85, temperature (!) 97.3 F (36.3 C), temperature source Oral, resp. rate 16, height 5\' 7"  (1.702 m), weight 81.6 kg, SpO2 100 %.  Assuming care from Dr. Quentin Cornwall.  In short, Joshua Mayo is a 46 y.o. male with a chief complaint of Seizures .  Refer to the original H&P for additional details.  The current plan of care is to follow-up labs and imaging following seizure, also concern for aspiration and now receiving antibiotics.  ----------------------------------------- 4:52 PM on 01/15/2021 ----------------------------------------- CT head and cervical spine are negative for acute process, patient's somnolent but easily arousable on reassessment and answers questions appropriately.  No evidence of ongoing seizure at this time and cervical spine was cleared.  Patient's oxygen was weaned down to 2 L and remains at 100%, we will continue to attempt to wean.  Patient reports "a couple" of seizures in the past, but has never been seen for this and denies taking any medication.  Case discussed with hospitalist for admission for further management of seizures and aspiration.    Blake Divine, MD 01/15/21 1655

## 2021-01-15 NOTE — Consult Note (Signed)
Neurology Consult H&P  Joshua Mayo MR# [id number] 01/15/2021   CC: seizure   History is obtained from: patient and chart.  HPI: Joshua Mayo is a 46 y.o. male PMHx as reviewed below had a witnessed seizure at restaurant.   The patient states he does not have epilepsy and is not taking any antiseizure medications.  He added that yesterday he consumed a lot of alcohol "a lot...a lot"   The following information was taken from Dr. Edwena Blow note 01/15/2021 at 1849: "Patient states that he was in his usual state of health until this afternoon while he was at a hot dog restaurant when he started feeling dizzy and lightheaded.  He got up to leave the restaurant and does not remember what happened after that. Per bystanders patient had a seizure and hit his head on the table. He was transported to the ER by EMS and upon arrival was noted to be postictal and hypotensive with an initial blood pressure of 70/50."  While in the ED he had another witnessed seizure lasting <20 minutes and received midazolam IV and Keppra 1,500mg  load.   LKW: unk tNK given: No indication IR Thrombectomy No, indication Modified Rankin Scale: 0-Completely asymptomatic and back to baseline post- stroke NIHSS: 0  ROS: A complete ROS was performed and is negative except as noted in the HPI.   Past Medical History:  Diagnosis Date   CHF (congestive heart failure) (HCC)    Hypertension    Seizures (HCC)      Family History  Problem Relation Age of Onset   Heart disease Father    Heart disease Paternal Grandfather     Social History:  reports that he has been smoking cigarettes. He has a 10.00 pack-year smoking history. He has never used smokeless tobacco. He reports current alcohol use of about 23.0 standard drinks per week. He reports that he does not use drugs.   Prior to Admission medications   Medication Sig Start Date End Date Taking? Authorizing Provider  acamprosate (CAMPRAL) 333 MG tablet  Take 666 mg by mouth 3 (three) times daily. 01/09/21  Yes [provider]  aspirin EC 81 MG tablet Take 81 mg by mouth daily. Swallow whole.   Yes [provider]  carvedilol (COREG) 25 MG tablet Take 1 tablet (25 mg total) by mouth 2 (two) times daily with a meal. 04/15/20  Yes Just, Azalee Course, FNP  ENTRESTO 24-26 MG Take 1 tablet by mouth 2 (two) times daily. 03/10/20  Yes [provider]  escitalopram (LEXAPRO) 20 MG tablet Take 1 tablet (20 mg total) by mouth daily. 10/14/20  Yes Maple Hudson., MD  nicotine (NICODERM CQ - DOSED IN MG/24 HOURS) 21 mg/24hr patch Place 1 patch (21 mg total) onto the skin daily. 01/15/21 01/15/22 Yes Willy Eddy, MD  spironolactone (ALDACTONE) 25 MG tablet Take 50 mg by mouth daily. 03/10/20  Yes [provider]  tadalafil (CIALIS) 5 MG tablet Take 5 mg by mouth daily. 03/10/20  Yes [provider]    Exam: Current vital signs: BP 100/74 (BP Location: Right Arm)    Pulse 82    Temp 97.9 F (36.6 C) (Oral)    Resp 17    Ht 5\' 7"  (1.702 m)    Wt 81.6 kg    SpO2 100%    BMI 28.19 kg/m   Physical Exam  Constitutional: Appears well-developed and well-nourished.  Psych: Affect appropriate to situation Eyes: No scleral injection HENT:  No OP obstruction. Head: Normocephalic.  Cardiovascular: Normal rate and regular rhythm.  Respiratory: Effort normal, symmetric excursions bilaterally, no audible wheezing. GI: Soft.  No distension. There is no tenderness.  Skin: WDI  Neuro: Mental Status: Patient is awake, alert, oriented to person, place, month, year, and situation. Patient is able to give a clear and coherent history. Speech  fluent, intact comprehension and repetition. No signs of aphasia or neglect. Visual Fields are full. Pupils are equal, round, and reactive to light. EOMI without ptosis or diploplia.  Facial sensation is symmetric to temperature Facial movement is symmetric.  Hearing is intact to  voice. Uvula midline and palate elevates symmetrically. Shoulder shrug is symmetric. Tongue is midline without atrophy or fasciculations.  Tone is normal. Bulk is normal. 5/5 strength was present in all four extremities. Sensation is symmetric to light touch and temperature in the arms and legs. Deep Tendon Reflexes: 2+ and symmetric in the biceps and patellae. Toes are downgoing bilaterally. FNF and HKS are intact bilaterally. Gait - Deferred  I have reviewed labs in epic and the pertinent results are: Opioid (+) THC (+)  I have reviewed the images obtained: NCT head showed No acute intracranial abnormality.  Assessment: Joshua Mayo is a 46 y.o. male PMHx EtOH abuse with binge drinking related seizure.   The patient states he does not have epilepsy and is not taking any antiseizure medications.  He is back to baseline and at this time and currently no indication for antiseizure medications.  Plan: - [facility]. - Continue thiamine. - MRI brain without contrast - If MRI is positive for acute abnormality please call neurology. - If patient has another seizure administer lorazepam 2mg  IV and please call neurology. - Neurology will remain available, please call for questions.   Electronically signed by:  , MD Page: Marisue Humble 01/15/2021, 6:12 PM  If 7pm- 7am, please page neurology on call as listed in [facility].

## 2021-01-15 NOTE — ED Notes (Signed)
Pt has had 4 back to back seizures, witnessed by this RN. - trauma. Pt postictal at this time.

## 2021-01-15 NOTE — ED Notes (Signed)
Seizure precautions taken and pads placed

## 2021-01-15 NOTE — ED Triage Notes (Signed)
Pt has witnessed seizure in front of this RN approximately 1-2 min. - trauma during. O2 via NRM, airway suctioned, c-collar applied

## 2021-01-15 NOTE — ED Provider Notes (Signed)
[facility] Provider Note    Event Date/Time   First MD Initiated Contact with Patient 01/15/21 1354     (approximate)   History   Seizures  Level V Caeveat:  postictal  HPI  Joshua Mayo is a 46 y.o. male patient with a history of seizure disorder presents to the ER for witnessed seizure episode that occurred while using hotdogs [place] today.  Reportedly had seizure hit his head and is complaining of neck pain.  Was postictal.  Patient refused any treatment with EMS but was drowsy and postictal.  On arrival to the ER he is still postictal found to be hypotensive but following commands.     Physical Exam   Triage Vital Signs: ED Triage Vitals  Enc Vitals Group     BP 01/15/21 1358 (!) 70/50     Pulse Rate 01/15/21 1358 85     Resp 01/15/21 1358 16     Temp 01/15/21 1358 (!) 97.3 F (36.3 C)     Temp Source 01/15/21 1358 Oral     SpO2 01/15/21 1358 100 %     Weight 01/15/21 1417 180 lb (81.6 kg)     Height 01/15/21 1417 5\' 7"  (1.702 m)     Head Circumference --      Peak Flow --      Pain Score 01/15/21 1417 3     Pain Loc --      Pain Edu? --      Excl. in [place]? --     Most recent vital signs: Vitals:   01/15/21 1358  BP: (!) 70/50  Pulse: 85  Resp: 16  Temp: (!) 97.3 F (36.3 C)  SpO2: 100%     Constitutional: postictal Eyes: Conjunctivae are normal.  Head: Atraumatic. Nose: No congestion/rhinnorhea. Mouth/Throat: Mucous membranes are moist.   Neck: Painless ROM.  Cardiovascular:   Good peripheral circulation. Respiratory: Normal respiratory effort.  No retractions.  Gastrointestinal: Soft and nontender.  Musculoskeletal:  no deformity Neurologic:  drowsy and postictal but mae, following commands Skin:  Skin is warm, dry and intact. No rash noted. Psychiatric: cooperative    ED Results / Procedures / Treatments   Labs (all labs ordered are listed, but only abnormal results are displayed) Labs Reviewed  CBC -  Abnormal; Notable for the following components:      Result Value   RBC 4.21 (*)    All other components within normal limits  COMPREHENSIVE METABOLIC PANEL - Abnormal; Notable for the following components:   CO2 19 (*)    Glucose, Bld 125 (*)    BUN 31 (*)    Creatinine, Ser 1.88 (*)    GFR, Estimated 44 (*)    Anion gap 16 (*)    All other components within normal limits  CBG MONITORING, ED - Abnormal; Notable for the following components:   Glucose-Capillary 125 (*)    All other components within normal limits  RESP PANEL BY RT-PCR (FLU A&B, COVID) ARPGX2  CULTURE, BLOOD (ROUTINE X 2)  CULTURE, BLOOD (ROUTINE X 2)  LACTIC ACID, PLASMA  TROPONIN I (HIGH SENSITIVITY)     EKG  ED ECG REPORT I, Merlyn Lot, the attending physician, personally viewed and interpreted this ECG.   Date: 01/15/2021  EKG Time: 14:01  Rate: 85  Rhythm: sinus  Axis: normal  Intervals:normal intervals  ST&T Change: inferolateral t wave inv unchanged from previous    RADIOLOGY Please see ED Course for my review and  interpretation.  I personally reviewed all radiographic images ordered to evaluate for the above acute complaints and reviewed radiology reports and findings.  These findings were personally discussed with the patient.  Please see medical record for radiology report.    PROCEDURES:  Critical Care performed: No  Procedures   MEDICATIONS ORDERED IN ED: Medications  Ampicillin-Sulbactam (UNASYN) 3 g in sodium chloride 0.9 % 100 mL IVPB (has no administration in time range)  levETIRAcetam (KEPPRA) IVPB 1500 mg/ 100 mL premix (0 mg Intravenous Stopped 01/15/21 1427)  midazolam (VERSED) injection 2 mg (2 mg Intravenous Given 01/15/21 1423)  sodium chloride 0.9 % bolus 1,000 mL (1,000 mLs Intravenous New Bag/Given 01/15/21 1401)     IMPRESSION / MDM / ASSESSMENT AND PLAN / ED COURSE  I reviewed the triage vital signs and the nursing notes.                               Differential diagnosis includes, but is not limited to, seizure, withdrawal, elctrolyrte abn, anemia, sepsis, aspriation, cva, iph  Patient presenting hypotensive after seizure episode.  Reported history of noncompliance.  Patient given IV Versed as well as Keppra as he had 1 additional episode of seizure-like activity since here.  It was brief lasting less than a minute.  Patient will be sent for CT imaging as well as IV fluids as he is hypotensive.  EKG with inferolateral T wave inversions unchanged from previous.     Clinical Course as of 01/15/21 1519  Thu Jan 15, 2021  1458 Chest x-ray by my review does not show any evidence of pneumothorax.  Radiology report concern for right lower lobe infiltrate.  Given seizure activity lack of fever possible aspiration will give dose of Unasyn. [PR]  [id number] CT head by my review does not show any evidence of subdural hematoma or IPH.  Will await formal radiology review. [PR]  [id number] Patient reassessed.  Still protecting his airway appears postictal.  He is following commands.  Do not believe this meets criteria for status at this time we will continue to observe.  Patient will be signed out to oncoming physician for additional observation. [PR]    Clinical Course User Index [PR] Merlyn Lot, MD     FINAL CLINICAL IMPRESSION(S) / ED DIAGNOSES   Final diagnoses:  Seizure ([place])     Rx / DC Orders   ED Discharge Orders     None        Note:  This document was prepared using Dragon voice recognition software and may include unintentional dictation errors.    Merlyn Lot, MD 01/15/21 [phone]

## 2021-01-15 NOTE — H&P (Addendum)
History and Physical    Joshua Mayo [id number] DOB: 28-Apr-1975 DOA: 01/15/2021  PCP: Margo Common, PA-C (Inactive)   Patient coming from: Home  I have personally briefly reviewed patient's old medical records in [place]  Chief Complaint: " I fell out"  HPI: Joshua Mayo is a 46 y.o. 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  Patient states that he was in his usual state of health until this afternoon while he was at a hot dog restaurant when he started feeling dizzy and lightheaded.  He got up to leave the restaurant and does not remember what happened after that. Per bystanders patient had a seizure and hit his head on the table. He was transported to the ER by EMS and upon arrival was noted to be postictal and hypotensive with an initial blood pressure of 70/50. While in the ER he had another witnessed seizure lasting 1 to 2 minutes and received Keppra 1.5 g as well as a dose of IV Versed. Patient is lethargic but arouses and is able to provide most of his history. He has a known history of seizures but states he is not on any medication.  He also admits to occasional alcohol use and his last drink was 1 day prior to his admission.  He denies illicit drug use. He denies having any chest pain, no shortness of breath, no headache, no fever, no chills, no cough, no urinary symptoms, no abdominal pain, no changes in his bowel habits, no palpitations, no diaphoresis, focal deficits. Sodium 140, potassium 3.9, chloride 105, bicarb 19, glucose 125, BUN 31, creatinine 1.8 compared to baseline of 0.95, calcium 9.1, alkaline phosphatase 39, albumin 4.1, AST 23, ALT 24, total protein 8.0, lactic acid 3.5, white count 8.1, hemoglobin 13.0, hematocrit 40.3, MCV 95.7,  RDW 12.9, platelet count 229 Respiratory viral panel is negative Chest x-ray reviewed by me shows a new patchy infiltrate in the right lower lung fields suggesting atelectasis/pneumonia. CT scan of the head without contrast/cervical spine CT shows no acute intracranial abnormality.  No CT evidence of acute cervical spine injury. Twelve-lead EKG reviewed by me shows sinus rhythm with an inferior infarct    ED Course: Patient is a 46 year old male who was brought into the ER for evaluation after he had a witnessed seizure at a restaurant.  He had another seizure while in the ER and was loaded with Keppra as well as IV Versed. He is lethargic but arousable. Labs reveal worsening of his renal function from baseline, serum creatinine is 1.8 on admission compared to baseline of 0.95.  Lactic acid is elevated at 3.5 Chest x-ray shows a new right lower lobe infiltrate. Patient received a dose of Unasyn in the ER and will be admitted for further evaluation.   Review of Systems: As per HPI otherwise all other systems reviewed and negative.    Past Medical History:  Diagnosis Date   CHF (congestive heart failure) (HCC)    Hypertension    Seizures ([place])     Past Surgical History:  Procedure Laterality Date   COLONOSCOPY WITH PROPOFOL N/A 04/03/2020   Procedure: COLONOSCOPY WITH PROPOFOL;  Surgeon: Virgel Manifold, MD;  Location: [facility] ENDOSCOPY;  Service: Endoscopy;  Laterality: N/A;   ESOPHAGOGASTRODUODENOSCOPY (EGD) WITH PROPOFOL N/A 04/03/2020  Procedure: ESOPHAGOGASTRODUODENOSCOPY (EGD) WITH PROPOFOL;  Surgeon: Virgel Manifold, MD;  Location: [facility] ENDOSCOPY;  Service: Endoscopy;  Laterality: N/A;     reports that he has been smoking cigarettes. He has a 10.00 pack-year smoking history. He has never used smokeless tobacco. He reports current alcohol use of about 23.0 standard drinks per week. He reports that he does not use drugs.  Allergies  Allergen Reactions   Banana  Anaphylaxis    Family History  Problem Relation Age of Onset   Heart disease Father    Heart disease Paternal Grandfather       Prior to Admission medications   Medication Sig Start Date End Date Taking? Authorizing Provider  acamprosate (CAMPRAL) 333 MG tablet Take 666 mg by mouth 3 (three) times daily. 01/09/21  Yes [provider]  aspirin EC 81 MG tablet Take 81 mg by mouth daily. Swallow whole.   Yes [provider]  carvedilol (COREG) 25 MG tablet Take 1 tablet (25 mg total) by mouth 2 (two) times daily with a meal. 04/15/20  Yes Just, Laurita Quint, FNP  ENTRESTO 24-26 MG Take 1 tablet by mouth 2 (two) times daily. 03/10/20  Yes [provider]  escitalopram (LEXAPRO) 20 MG tablet Take 1 tablet (20 mg total) by mouth daily. 10/14/20  Yes Jerrol Banana., MD  nicotine (NICODERM CQ - DOSED IN MG/24 HOURS) 21 mg/24hr patch Place 1 patch (21 mg total) onto the skin daily. 01/15/21 01/15/22 Yes Merlyn Lot, MD  spironolactone (ALDACTONE) 25 MG tablet Take 50 mg by mouth daily. 03/10/20  Yes [provider]  tadalafil (CIALIS) 5 MG tablet Take 5 mg by mouth daily. 03/10/20  Yes [provider]    Physical Exam: Vitals:   01/15/21 1417 01/15/21 1521 01/15/21 1645 01/15/21 1745  BP:  97/64 97/63 100/74  Pulse:  86 94 82  Resp:  15 18 17   Temp:    97.9 F (36.6 C)  TempSrc:    Oral  SpO2:  100% 100% 100%  Weight: 81.6 kg     Height: 5\' 7"  (1.702 m)        Vitals:   01/15/21 1417 01/15/21 1521 01/15/21 1645 01/15/21 1745  BP:  97/64 97/63 100/74  Pulse:  86 94 82  Resp:  15 18 17   Temp:    97.9 F (36.6 C)  TempSrc:    Oral  SpO2:  100% 100% 100%  Weight: 81.6 kg     Height: 5\' 7"  (1.702 m)         Constitutional: Lethargic but arouses easily. Not in any apparent distress HEENT:      Head: Normocephalic and atraumatic.         Eyes: PERLA, EOMI, Conjunctivae are normal. Sclera is non-icteric.       Mouth/Throat: Mucous  membranes are moist.       Neck: Supple with no signs of meningismus. Cardiovascular: Regular rate and rhythm. No murmurs, gallops, or rubs. 2+ symmetrical distal pulses are present . No JVD. No LE edema Respiratory: Respiratory effort normal .rhonchi at right lung base. No wheezes or crackles   Gastrointestinal: Soft, non tender, and non distended with positive bowel sounds.  Genitourinary: No CVA tenderness. Musculoskeletal: Nontender with normal range of motion in all extremities. No cyanosis, or erythema of extremities. Neurologic:  Face is symmetric. Moving all extremities. No gross focal neurologic deficits.  Able to move all extremities Skin: Skin is warm, dry.  No rash or ulcers  Psychiatric: Mood and affect are normal    Labs on Admission: I have personally reviewed following labs and imaging studies  CBC: Recent Labs  Lab 01/15/21 1407  WBC 8.1  HGB 13.0  HCT 40.3  MCV 95.7  PLT [id number]   Basic Metabolic Panel: Recent Labs  Lab 01/15/21 1407  NA 140  K 3.9  CL 105  CO2 19*  GLUCOSE 125*  BUN 31*  CREATININE 1.88*  CALCIUM 9.1   GFR: Estimated Creatinine Clearance: 49.7 mL/min (A) (by C-G formula based on SCr of 1.88 mg/dL (H)). Liver Function Tests: Recent Labs  Lab 01/15/21 1407  AST 23  ALT 24  ALKPHOS 39  BILITOT 0.4  PROT 8.0  ALBUMIN 4.1   No results for input(s): LIPASE, AMYLASE in the last 168 hours. No results for input(s): AMMONIA in the last 168 hours. Coagulation Profile: No results for input(s): INR, PROTIME in the last 168 hours. Cardiac Enzymes: No results for input(s): CKTOTAL, CKMB, CKMBINDEX, TROPONINI in the last 168 hours. BNP (last 3 results) No results for input(s): PROBNP in the last 8760 hours. HbA1C: No results for input(s): HGBA1C in the last 72 hours. CBG: Recent Labs  Lab 01/15/21 1422  GLUCAP 125*   Lipid Profile: No results for input(s): CHOL, HDL, LDLCALC, TRIG, CHOLHDL, LDLDIRECT in the last 72 hours. Thyroid  Function Tests: No results for input(s): TSH, T4TOTAL, FREET4, T3FREE, THYROIDAB in the last 72 hours. Anemia Panel: No results for input(s): VITAMINB12, FOLATE, FERRITIN, TIBC, IRON, RETICCTPCT in the last 72 hours. Urine analysis:    Component Value Date/Time   COLORURINE YELLOW (A) 03/20/2020 0022   APPEARANCEUR CLEAR (A) 03/20/2020 0022   LABSPEC 1.042 (H) 03/20/2020 0022   PHURINE 6.0 03/20/2020 0022   GLUCOSEU NEGATIVE 03/20/2020 0022   HGBUR NEGATIVE 03/20/2020 0022   BILIRUBINUR NEGATIVE 03/20/2020 0022   BILIRUBINUR neg 11/01/2017 1349   KETONESUR 80 (A) 03/20/2020 0022   PROTEINUR NEGATIVE 03/20/2020 0022   UROBILINOGEN 0.2 11/01/2017 1349   NITRITE NEGATIVE 03/20/2020 0022   LEUKOCYTESUR NEGATIVE 03/20/2020 0022    Radiological Exams on Admission: CT HEAD WO CONTRAST (5MM)  Result Date: 01/15/2021 CLINICAL DATA:  Trauma EXAM: CT HEAD WITHOUT CONTRAST CT CERVICAL SPINE WITHOUT CONTRAST TECHNIQUE: Multidetector CT imaging of the head and cervical spine was performed following the standard protocol without intravenous contrast. Multiplanar CT image reconstructions of the cervical spine were also generated. RADIATION DOSE REDUCTION: This exam was performed according to the departmental dose-optimization program which includes automated exposure control, adjustment of the mA and/or kV according to patient size and/or use of iterative reconstruction technique. COMPARISON:  None. FINDINGS: CT HEAD FINDINGS Brain: No evidence of acute infarction, hemorrhage, hydrocephalus, extra-axial collection or mass lesion/mass effect. Vascular: No hyperdense vessel or unexpected calcification. Skull: Normal. Negative for fracture or focal lesion. Sinuses/Orbits: No acute finding. Other: None. CT CERVICAL SPINE FINDINGS Alignment: Normal. Skull base and vertebrae: No acute fracture. No primary bone lesion or focal pathologic process. Soft tissues and spinal canal: No prevertebral fluid or swelling. No  visible canal hematoma. Disc levels:  No significant degenerative changes. Upper chest: Bilateral dependent atelectasis. Other: None. IMPRESSION: 1. No acute intracranial abnormality. 2. No CT evidence of acute cervical spine injury. Electronically Signed   By: Yetta Glassman M.D.   On: 01/15/2021 15:19   CT Cervical Spine Wo Contrast  Result Date: 01/15/2021 CLINICAL DATA:  Trauma EXAM: CT HEAD WITHOUT CONTRAST CT CERVICAL SPINE WITHOUT CONTRAST TECHNIQUE: Multidetector CT imaging of  the head and cervical spine was performed following the standard protocol without intravenous contrast. Multiplanar CT image reconstructions of the cervical spine were also generated. RADIATION DOSE REDUCTION: This exam was performed according to the departmental dose-optimization program which includes automated exposure control, adjustment of the mA and/or kV according to patient size and/or use of iterative reconstruction technique. COMPARISON:  None. FINDINGS: CT HEAD FINDINGS Brain: No evidence of acute infarction, hemorrhage, hydrocephalus, extra-axial collection or mass lesion/mass effect. Vascular: No hyperdense vessel or unexpected calcification. Skull: Normal. Negative for fracture or focal lesion. Sinuses/Orbits: No acute finding. Other: None. CT CERVICAL SPINE FINDINGS Alignment: Normal. Skull base and vertebrae: No acute fracture. No primary bone lesion or focal pathologic process. Soft tissues and spinal canal: No prevertebral fluid or swelling. No visible canal hematoma. Disc levels:  No significant degenerative changes. Upper chest: Bilateral dependent atelectasis. Other: None. IMPRESSION: 1. No acute intracranial abnormality. 2. No CT evidence of acute cervical spine injury. Electronically Signed   By: Yetta Glassman M.D.   On: 01/15/2021 15:19   US RENAL  Result Date: 01/15/2021 CLINICAL DATA:  Acute kidney injury EXAM: RENAL / URINARY TRACT ULTRASOUND COMPLETE COMPARISON:  No prior renal ultrasound.  FINDINGS: Right Kidney: Renal measurements: 9.9 x 4.8 x 5.5 cm = volume: 137 mL. Echogenicity within normal limits. No mass or hydronephrosis visualized. Left Kidney: Renal measurements: 12.0 x 5.4 x 5.3 cm = volume: 177 mL. Echogenicity within normal limits. No mass or hydronephrosis visualized. Bladder: Appears normal for degree of bladder distention. Other: None. IMPRESSION: No acute finding in the kidneys and bladder. No etiology seen for the patient's AKI. Electronically Signed   By: Merilyn Baba M.D.   On: 01/15/2021 17:58   DG Chest Portable 1 View  Result Date: 01/15/2021 CLINICAL DATA:  Altered mental status, seizures EXAM: PORTABLE CHEST 1 VIEW COMPARISON:  03/19/2020 FINDINGS: Transverse diameter of heart is increased. Central pulmonary vessels are prominent. There is poor inspiration. Interstitial markings in the parahilar regions are prominent. Linear patchy infiltrate is seen in the right lower lung fields. There is no pleural effusion or pneumothorax. IMPRESSION: New patchy infiltrate is seen in the right lower lung fields suggesting atelectasis/pneumonia. There is slight prominence of interstitial markings in the parahilar regions which may be due to poor inspiration. There is no pleural effusion or pneumothorax. Electronically Signed   By: Elmer Picker M.D.   On: 01/15/2021 14:34     Assessment/Plan Principal Problem:   Seizure ([place]) Active Problems:   Alcohol abuse   Tobacco abuse   Aspiration pneumonia (HCC)   Alcoholic cardiomyopathy (HCC)   Chronic systolic CHF (congestive heart failure) ([place])   AKI (acute kidney injury) [facility])     Patient is a 46 year old male admitted to the hospital for recurrent seizure episode as well as aspiration pneumonia.     Seizure Patient has a known history of seizures but is not on any medication Seizures may be secondary to ??  Alcohol withdrawal given patient's history of alcohol abuse Will place patient on IV lorazepam as  needed for breakthrough seizure Continue Keppra 1 g IV twice daily We will request neurology consult Place patient on seizure precautions    AKI Most likely secondary to ATN from hypotension. At baseline patient has a serum creatinine of 0.95 but on admission it was 1.8 Will hold Coreg, spironolactone and Entresto Judicious IV fluid hydration Monitor respiratory status closely due to patient's known history of cardiomyopathy with LVEF of 40% Obtain CK levels  Obtain renal ultrasound    Aspiration pneumonia Patient noted to have a new right lower lobe infiltrate Place patient on strict aspiration precautions Continue Unasyn initiated in the ER     Chronic systolic heart failure Secondary to alcoholic cardiomyopathy Patient's last known LVEF was 40% Hold carvedilol, spironolactone and Entresto due to relative hypotension     Alcohol abuse Patient admits to continued alcohol use Will place on alcohol withdrawal protocol and administer lorazepam for CIWA score of 8 or greater Place patient on MVI, thiamine and folic acid     Nicotine dependence Smoking cessation has been discussed with patient in detail Will place patient on a nicotine transdermal patch 21 mg daily     Lactic acidosis No evidence of sepsis at this time and is most likely related to seizure Trend lactic acid levels  DVT prophylaxis: SCD  Code Status: full code  Family Communication: Greater than 50% of time was spent discussing patient's condition and plan of care with him at the bedside.  All questions and concerns have been addressed.  He verbalizes understanding and agrees to the plan. Disposition Plan: Back to previous home environment Consults called: Neurology Status:At the time of admission, it appears that the appropriate admission status for this patient is inpatient. This is judged to be reasonable and necessary to provide the required intensity of service to ensure the patient's safety  given the presenting symptoms, physical exam findings, and initial radiographic and laboratory data in the context of their comorbid conditions. Patient requires inpatient status due to high intensity of service, high risk for further deterioration and high frequency of surveillance required.     Collier Bullock MD [facility]     01/15/2021, 6:18 PM

## 2021-01-15 NOTE — ED Notes (Signed)
US at bedside

## 2021-01-15 NOTE — ED Triage Notes (Signed)
Pt BIBA for seizure while eating hot dogs. Per bystanders, hit head on table. Per EMS, pt refusing c-collar, IV and all interventions en route. Pt a/ox4. Pt c/o point tenderness to c-spine

## 2021-01-15 NOTE — ED Notes (Signed)
Patient transported to CT 

## 2021-01-15 NOTE — Progress Notes (Signed)
Patient arrived to unit from ER in stable condition. Pt ambulated with contact guard assist from stretcher to bed.

## 2021-01-16 ENCOUNTER — Inpatient Hospital Stay
Admit: 2021-01-16 | Discharge: 2021-01-16 | Disposition: A | Discharge status: Home / Self Care | Payer: BC Managed Care – PPO | Attending: Neurology | Admitting: Neurology

## 2021-01-16 ENCOUNTER — Inpatient Hospital Stay: Payer: BC Managed Care – PPO

## 2021-01-16 LAB — GLUCOSE, CAPILLARY: Glucose-Capillary: 119 mg/dL — ABNORMAL HIGH (ref 70–99)

## 2021-01-16 LAB — BASIC METABOLIC PANEL
Anion gap: 6 (ref 5–15)
BUN: 25 mg/dL — ABNORMAL HIGH (ref 6–20)
CO2: 20 mmol/L — ABNORMAL LOW (ref 22–32)
Calcium: 8.1 mg/dL — ABNORMAL LOW (ref 8.9–10.3)
Chloride: 111 mmol/L (ref 98–111)
Creatinine, Ser: 1.1 mg/dL (ref 0.61–1.24)
GFR, Estimated: >60 mL/min (ref >60)
Glucose, Bld: 80 mg/dL (ref 70–99)
Potassium: 3.8 mmol/L (ref 3.5–5.1)
Sodium: 137 mmol/L (ref 135–145)

## 2021-01-16 LAB — ECHOCARDIOGRAM COMPLETE
AR max vel: 2.15 cm2
AV Area VTI: 2.53 cm2
AV Area mean vel: 2.19 cm2
AV Mean grad: 3 mmHg
AV Peak grad: 6.6 mmHg
Ao pk vel: 1.28 m/s
Area-P 1/2: 8.16 cm2
Height: 67 in
MV VTI: 3.17 cm2
Weight: 2880 oz

## 2021-01-16 LAB — BLOOD CULTURE ID PANEL (REFLEXED) - BCID2

## 2021-01-16 LAB — CBC
HCT: 32.5 % — ABNORMAL LOW (ref 39.0–52.0)
Hemoglobin: 10.7 g/dL — ABNORMAL LOW (ref 13.0–17.0)
MCH: 30.9 pg (ref 26.0–34.0)
MCHC: 32.9 g/dL (ref 30.0–36.0)
MCV: 93.9 fL (ref 80.0–100.0)
Platelets: 180 10*3/uL (ref 150–400)
RBC: 3.46 MIL/uL — ABNORMAL LOW (ref 4.22–5.81)
RDW: 12.9 % (ref 11.5–15.5)
WBC: 7.1 10*3/uL (ref 4.0–10.5)
nRBC: 0 % (ref 0.0–0.2)

## 2021-01-16 LAB — HEMOGLOBIN A1C
Hgb A1c MFr Bld: 4.7 % — ABNORMAL LOW (ref 4.8–5.6)
Mean Plasma Glucose: 88.19 mg/dL

## 2021-01-16 LAB — LIPID PANEL
Cholesterol: 195 mg/dL (ref 0–200)
HDL: 56 mg/dL (ref >40)
LDL Cholesterol: 127 mg/dL — ABNORMAL HIGH (ref 0–99)
Total CHOL/HDL Ratio: 3.5 RATIO
Triglycerides: 61 mg/dL (ref <150)
VLDL: 12 mg/dL (ref 0–40)

## 2021-01-16 MED ORDER — AMOXICILLIN-POT CLAVULANATE 875-125 MG PO TABS
1.0000 | ORAL_TABLET | Freq: Two times a day (BID) | ORAL | Status: DC
  Administered 2021-01-16 – 2021-01-17 (×3): 1 via ORAL
  Filled 2021-01-16 (×3): qty 1

## 2021-01-16 MED ORDER — SPIRONOLACTONE 25 MG PO TABS
50.0000 mg | ORAL_TABLET | Freq: Every day | ORAL | Status: DC
  Administered 2021-01-17 – 2021-01-19 (×3): 50 mg via ORAL
  Filled 2021-01-16 (×3): qty 2

## 2021-01-16 MED ORDER — HYDROCODONE-ACETAMINOPHEN 5-325 MG PO TABS
1.0000 | ORAL_TABLET | Freq: Four times a day (QID) | ORAL | Status: DC | PRN
  Administered 2021-01-16 – 2021-01-18 (×9): 1 via ORAL
  Filled 2021-01-16 (×9): qty 1

## 2021-01-16 MED ORDER — IOHEXOL 350 MG/ML SOLN
75.0000 mL | Freq: Once | INTRAVENOUS | Status: AC | PRN
  Administered 2021-01-16: 12:00:00 75 mL via INTRAVENOUS

## 2021-01-16 MED ORDER — ROSUVASTATIN CALCIUM 10 MG PO TABS
10.0000 mg | ORAL_TABLET | Freq: Every day | ORAL | Status: DC
  Administered 2021-01-16 – 2021-01-19 (×4): 10 mg via ORAL
  Filled 2021-01-16 (×4): qty 1

## 2021-01-16 MED ORDER — LIDOCAINE 5 % EX PTCH
1.0000 | MEDICATED_PATCH | CUTANEOUS | Status: DC
  Administered 2021-01-16 – 2021-01-19 (×4): 1 via TRANSDERMAL
  Filled 2021-01-16 (×5): qty 1

## 2021-01-16 MED ORDER — KETOROLAC TROMETHAMINE 30 MG/ML IJ SOLN
15.0000 mg | Freq: Once | INTRAMUSCULAR | Status: AC
  Administered 2021-01-16: 15 mg via INTRAVENOUS
  Filled 2021-01-16: qty 1

## 2021-01-16 MED ORDER — CARVEDILOL 25 MG PO TABS
25.0000 mg | ORAL_TABLET | Freq: Two times a day (BID) | ORAL | Status: DC
  Administered 2021-01-16 – 2021-01-20 (×8): 25 mg via ORAL
  Filled 2021-01-16 (×8): qty 1

## 2021-01-16 MED ORDER — SACUBITRIL-VALSARTAN 24-26 MG PO TABS
1.0000 | ORAL_TABLET | Freq: Two times a day (BID) | ORAL | Status: DC
  Administered 2021-01-16 – 2021-01-19 (×7): 1 via ORAL
  Filled 2021-01-16 (×10): qty 1

## 2021-01-16 NOTE — Progress Notes (Signed)
Preliminary EEG read did not seizure or epileptiform discharges.   Electronically signed by:  Marisue Humble, MD Page: [phone] 01/16/2021, 4:44 PM

## 2021-01-16 NOTE — Progress Notes (Signed)
Neurology Progress Note Joshua Mayo MR# [id number] 01/16/2021   S: no overnight events; no new complaints. O: Current vital signs: BP 118/82 (BP Location: Left Wrist)    Pulse 94    Temp 97.8 F (36.6 C) (Oral)    Resp 16    Ht 5\' 7"  (1.702 m)    Wt 81.6 kg    SpO2 100%    BMI 28.19 kg/m  Vital signs in last 24 hours: Temp:  [97.3 F (36.3 C)-98.2 F (36.8 C)] 97.8 F (36.6 C) (01/13 0804) Pulse Rate:  [77-94] 94 (01/13 0804) Resp:  [15-20] 16 (01/13 0804) BP: (70-126)/(50-84) 118/82 (01/13 0804) SpO2:  [100 %] 100 % (01/13 0804) Weight:  [81.6 kg] 81.6 kg (01/12 1417) GENERAL: Awake, alert in NAD HEENT: Normocephalic and atraumatic, moist mm, no LN++, no thyromegaly LUNGS: symmetric excursions bilaterally with no audible wheezes. CV: RR, equal pulses bilaterally. ABDOMEN: Soft, nontender, nondistended with normoactive BS Ext: warm, well perfused, intact peripheral pulses  NEURO:  Mental Status: AA&Ox3  Language: speech is fluent.  Intact naming, repetition, and comprehension. PERR. EOMI, visual fields full, no facial asymmetry, facial sensation intact, hearing intact. No evidence of tongue atrophy or fibrillations, tongue/uvula/soft palate midline elevates symmetrically  Normal sternocleidomastoid and trapezius muscle strength. Motor:  strength in all extremities. Tone: Tone and bulk is normal Sensation: Intact to light touch bilaterally Coordination: FTN intact bilaterally, no ataxia in BLE. Gait - Deferred  NIHSS 0  Labs Pending   Imaging I have reviewed images in epic and the results pertinent to this consultation are:  MRI Brain showed no acute intracranial abnormality. 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  Assessment: Joshua Mayo is a 46 y.o. male PMHx alcohol abuse, tobacco abuse, alcoholic cardiomyopathy, acute respiratory failure with  hypoxia 05/15/2018 admitted for seizure related to binge drinking a very large quantity of EtOH the previous night.    MRI incidentally showed remote right parietoccipital and bilateral cerebellar infarcts which were also seen on CT head 03/19/2020.  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  Recommendations: Continue home aspirin Continue thiamine, folate  Ordered: - CTA head and neck. - TTE  - Labs: HbA1c, lipid panel. - Recommend Statin if LDL > 70 - Goal A1c <70 - BP goal <140/90.  - If patient has another seizure administer lorazepam 2mg  IV and please call neurology.   Electronically signed by:  03/21/2020, MD Page: 01/16/2021, 8:26 AM  If 7pm- 7am, please page neurology on call as listed in AMION.

## 2021-01-16 NOTE — Progress Notes (Signed)
RN notified by transport teammate that patient is back from Xray but had some "choking" and nausea during the transport. RN to check on patient and patient is very lethargic and less responsive than initial assessment. Neurology notified as well as attending. Patient then started having more seizure like activity starting with left hand tremors that led to full body tremors and non responsiveness. Ativan 2mg  given. Activity lasted about 3-4 minutes. Providers made aware and patient down in EEG now.

## 2021-01-16 NOTE — Progress Notes (Addendum)
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  The patient presented to the emergency department after witnessed seizure at [facility].     Subjective:   This morning he is not encephalopathic.  He is not hallucinating.  Complaining of right rib pain from fall.  States his last drink was 2 days ago.  Drank quite a bit about 12 beers and fifth of vodka.  Denies any chest pain.    Assessment and Plan:   Postictal encephalopathy: Resolved  Witnessed seizure: Related to binge drinking a very large quantity of alcohol the previous night.  Neurology following in consultation.  Recommend no antiepileptic medication at this time.  Continue monitoring and administer Ativan as needed for any further seizure-like activity and calling neurology.  Continue seizure precautions.  Of note he was Keppra loaded in the ER.  Aspiration pneumonia: Not hypoxic.  Noted on imaging.  Currently on Unasyn.  Will transition to Augmentin.  Right rib pain: Secondary to fall. Obtain right rib imaging.  Lidoderm patch ordered.  Pain scale in place.  Fall: Secondary to witnessed seizure.  CT head and CT C-spine showed no acute findings.  History of CVA: MRI showed mild cortical infarct of the right parietal occipital region with additional small remote bilateral cerebellar infarcts left greater than right.  Continue home aspirin.  LDL was greater than 70.  We will start Crestor 5 mg daily.  Neurology ordering CTA head neck and echocardiogram.  Alcohol dependence: Continue CIWA protocol. Still requiring Ativan per CIWA protocol. Counseled on alcohol cessation.  Continue thiamine, folic acid and multivitamin.  Continue  home Campral.  TOC consulted for substance abuse education.  Lactic acidosis: Likely secondary to aspiration pneumonia or witnessed seizure.  Resolved with IV fluids  Acute kidney injury: Resolved with IV fluids.  Renal sonogram showed no obstructive uropathy.  Alcoholic cardiomyopathy: Last EF 40%.  Resume home Entresto, Coreg and Aldactone.  Repeat echocardiogram ordered.  Appears euvolemic.  Not in acute exacerbation  Tobacco dependence: Counseled on smoking cessation.  Continue nicotine patch.   Other information:    DVT prophylaxis: SCDs Code Status: Full code Family Communication: No family present at bedside Disposition:   Status is: Inpatient  Remains inpatient appropriate because: Witnessed seizure     Consultants:   Neurology    Objective:    Vitals:   01/16/21 0023 01/16/21 0444 01/16/21 0804 01/16/21 1117  BP: 126/80 115/84 118/82 109/79  Pulse: 91 93 94 (!) 105  Resp: 20 20 16 16   Temp: 97.7 F (36.5 C) 97.6 F (36.4 C) 97.8 F (36.6 C) 97.9 F (36.6 C)  TempSrc: Oral Oral Oral Oral  SpO2: 100% 100% 100% 99%  Weight:      Height:       No intake or output data in the 24 hours ending 01/16/21 1350 Filed Weights   01/15/21 1417  Weight: 81.6 kg       Physical Exam:    General exam: Appears calm and comfortable  Respiratory system: Clear to auscultation. Respiratory effort normal. Cardiovascular system: S1 & S2 heard, RRR. No JVD, murmurs, rubs, gallops or clicks. No pedal edema. Gastrointestinal system: Abdomen is nondistended, soft and nontender. No organomegaly or masses  felt. Normal bowel sounds heard. Central nervous system: Alert and oriented x4. No focal neurological deficits. Extremities: Symmetric 5 x 5 power. Skin: No rashes, lesions or ulcers Psychiatry: Judgement and insight appear normal. Mood & affect appropriate.     Data Reviewed:    I have personally reviewed following labs and imaging studies  CBC: Recent Labs   Lab 01/15/21 1407 01/16/21 0659  WBC 8.1 7.1  HGB 13.0 10.7*  HCT 40.3 32.5*  MCV 95.7 93.9  PLT 229 180    Basic Metabolic Panel: Recent Labs  Lab 01/15/21 1407 01/16/21 0659  NA 140 137  K 3.9 3.8  CL 105 111  CO2 19* 20*  GLUCOSE 125* 80  BUN 31* 25*  CREATININE 1.88* 1.10  CALCIUM 9.1 8.1*  MG 2.0  --     GFR: Estimated Creatinine Clearance: 84.9 mL/min (by C-G formula based on SCr of 1.1 mg/dL).  Liver Function Tests: Recent Labs  Lab 01/15/21 1407  AST 23  ALT 24  ALKPHOS 39  BILITOT 0.4  PROT 8.0  ALBUMIN 4.1    CBG: Recent Labs  Lab 01/15/21 1422  GLUCAP 125*     Recent Results (from the past 240 hour(s))  Resp Panel by RT-PCR (Flu A&B, Covid) Nasopharyngeal Swab     Status: None   Collection Time: 01/15/21  3:16 PM   Specimen: Nasopharyngeal Swab; Nasopharyngeal(NP) swabs in vial transport medium  Result Value Ref Range Status   SARS Coronavirus 2 by RT PCR NEGATIVE NEGATIVE Final    Comment: (NOTE) SARS-CoV-2 target nucleic acids are NOT DETECTED.  The SARS-CoV-2 RNA is generally detectable in upper respiratory specimens during the acute phase of infection. The lowest concentration of SARS-CoV-2 viral copies this assay can detect is 138 copies/mL. A negative result does not preclude SARS-Cov-2 infection and should not be used as the sole basis for treatment or other patient management decisions. A negative result may occur with  improper specimen collection/handling, submission of specimen other than nasopharyngeal swab, presence of viral mutation(s) within the areas targeted by this assay, and inadequate number of viral copies(<138 copies/mL). A negative result must be combined with clinical observations, patient history, and epidemiological information. The expected result is Negative.  Fact Sheet for Patients:  BloggerCourse.comhttps://www.fda.gov/media/152166/download  Fact Sheet for Healthcare Providers:   SeriousBroker.ithttps://www.fda.gov/media/152162/download  This test is no t yet approved or cleared by the [place] FDA and  has been authorized for detection and/or diagnosis of SARS-CoV-2 by FDA under an Emergency Use Authorization (EUA). This EUA will remain  in effect (meaning this test can be used) for the duration of the COVID-19 declaration under Section 564(b)(1) of the Act, 21 U.S.C.section 360bbb-3(b)(1), unless the authorization is terminated  or revoked sooner.       Influenza A by PCR NEGATIVE NEGATIVE Final   Influenza B by PCR NEGATIVE NEGATIVE Final    Comment: (NOTE) The Xpert Xpress SARS-CoV-2/FLU/RSV plus assay is intended as an aid in the diagnosis of influenza from Nasopharyngeal swab specimens and should not be used as a sole basis for treatment. Nasal washings and aspirates are unacceptable for Xpert Xpress SARS-CoV-2/FLU/RSV testing.  Fact Sheet for Patients: BloggerCourse.comhttps://www.fda.gov/media/152166/download  Fact Sheet for Healthcare Providers: SeriousBroker.ithttps://www.fda.gov/media/152162/download  This test is not yet approved or cleared by the [place] FDA and has been authorized for detection and/or diagnosis of SARS-CoV-2 by FDA under an Emergency Use Authorization (EUA). This EUA will remain in effect (meaning this test can be used) for the duration of  the COVID-19 declaration under Section 564(b)(1) of the Act, 21 U.S.C. section 360bbb-3(b)(1), unless the authorization is terminated or revoked.  Performed at [facility], [address] [place], [place] [address]   Blood culture (routine x 2)     Status: None (Preliminary result)   Collection Time: 01/15/21  3:16 PM   Specimen: BLOOD  Result Value Ref Range Status   Specimen Description BLOOD LEFT ANTECUBITAL  Final   Special Requests   Final    BOTTLES DRAWN AEROBIC AND ANAEROBIC Blood Culture adequate volume   Culture  Setup Time   Final    Organism ID to follow GRAM POSITIVE COCCI AEROBIC BOTTLE  ONLY CRITICAL RESULT CALLED TO, READ BACK BY AND VERIFIED WITH: Performed at [facility], [address] [place], [place] [address]    Culture GRAM POSITIVE COCCI  Final   Report Status PENDING  Incomplete  Blood culture (routine x 2)     Status: None (Preliminary result)   Collection Time: 01/15/21  3:16 PM   Specimen: BLOOD  Result Value Ref Range Status   Specimen Description BLOOD RIGHT ANTECUBITAL  Final   Special Requests   Final    BOTTLES DRAWN AEROBIC AND ANAEROBIC Blood Culture adequate volume   Culture   Final    NO GROWTH < 12 HOURS Performed at [facility], [address] [place], [place] [address]    Report Status PENDING  Incomplete         Radiology Studies:    CT HEAD WO CONTRAST (5MM)  Result Date: 01/15/2021 CLINICAL DATA:  Trauma EXAM: CT HEAD WITHOUT CONTRAST CT CERVICAL SPINE WITHOUT CONTRAST TECHNIQUE: Multidetector CT imaging of the head and cervical spine was performed following the standard protocol without intravenous contrast. Multiplanar CT image reconstructions of the cervical spine were also generated. RADIATION DOSE REDUCTION: This exam was performed according to the departmental dose-optimization program which includes automated exposure control, adjustment of the mA and/or kV according to patient size and/or use of iterative reconstruction technique. COMPARISON:  None. FINDINGS: CT HEAD FINDINGS Brain: No evidence of acute infarction, hemorrhage, hydrocephalus, extra-axial collection or mass lesion/mass effect. Vascular: No hyperdense vessel or unexpected calcification. Skull: Normal. Negative for fracture or focal lesion. Sinuses/Orbits: No acute finding. Other: None. CT CERVICAL SPINE FINDINGS Alignment: Normal. Skull base and vertebrae: No acute fracture. No primary bone lesion or focal pathologic process. Soft tissues and spinal canal: No prevertebral fluid or swelling. No visible canal hematoma. Disc levels:  No significant  degenerative changes. Upper chest: Bilateral dependent atelectasis. Other: None. IMPRESSION: 1. No acute intracranial abnormality. 2. No CT evidence of acute cervical spine injury. Electronically Signed   By: Allegra LaiLeah  Strickland M.D.   On: 01/15/2021 15:19   CT Cervical Spine Wo Contrast  Result Date: 01/15/2021 CLINICAL DATA:  Trauma EXAM: CT HEAD WITHOUT CONTRAST CT CERVICAL SPINE WITHOUT CONTRAST TECHNIQUE: Multidetector CT imaging of the head and cervical spine was performed following the standard protocol without intravenous contrast. Multiplanar CT image reconstructions of the cervical spine were also generated. RADIATION DOSE REDUCTION: This exam was performed according to the departmental dose-optimization program which includes automated exposure control, adjustment of the mA and/or kV according to patient size and/or use of iterative reconstruction technique. COMPARISON:  None. FINDINGS: CT HEAD FINDINGS Brain: No evidence of acute infarction, hemorrhage, hydrocephalus, extra-axial collection or mass lesion/mass effect. Vascular: No hyperdense vessel or unexpected calcification. Skull: Normal. Negative for fracture or focal lesion. Sinuses/Orbits: No acute finding. Other:  None. CT CERVICAL SPINE FINDINGS Alignment: Normal. Skull base and vertebrae: No acute fracture. No primary bone lesion or focal pathologic process. Soft tissues and spinal canal: No prevertebral fluid or swelling. No visible canal hematoma. Disc levels:  No significant degenerative changes. Upper chest: Bilateral dependent atelectasis. Other: None. IMPRESSION: 1. No acute intracranial abnormality. 2. No CT evidence of acute cervical spine injury. Electronically Signed   By: Allegra Lai M.D.   On: 01/15/2021 15:19   MR BRAIN WO CONTRAST  Result Date: 01/16/2021 CLINICAL DATA:  Initial evaluation for acute seizure. EXAM: MRI HEAD WITHOUT CONTRAST TECHNIQUE: Multiplanar, multiecho pulse sequences of the brain and surrounding  structures were obtained without intravenous contrast. COMPARISON:  Prior head CT from earlier the same day. FINDINGS: Brain: Examination moderately to severely degraded by motion artifact. Cerebral volume grossly within normal limits. Suspected at least mild chronic microvascular ischemic disease noted involving the periventricular white matter. Small remote cortical infarct present at the right parieto-occipital region. Additional small remote bilateral cerebellar infarcts, left larger than right. No visible foci of restricted diffusion to suggest acute or subacute ischemia or changes related to acute seizure. Gray-white matter differentiation otherwise maintained. No visible foci of susceptibility artifact to suggest acute or chronic intracranial hemorrhage. No mass lesion, midline shift or mass effect. Ventricles normal size without hydrocephalus. No extra-axial fluid collection. Pituitary gland suprasellar region within normal limits. Midline structures intact and normal. No visible intrinsic temporal lobe abnormality. Vascular: Major intracranial vascular flow voids are grossly maintained at the skull base. Diminutive vertebrobasilar system noted. Skull and upper cervical spine: Craniocervical junction within normal limits. Bone marrow signal intensity normal. No scalp soft tissue abnormality. Sinuses/Orbits: Globes and orbital soft tissues grossly within normal limits. Paranasal sinuses are largely clear. No significant mastoid effusion. Other: None. IMPRESSION: 1. Motion degraded exam. 2. No acute intracranial abnormality. 3. 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 Electronically Signed   By: Rise Mu M.D.   On: 01/16/2021 04:26   US RENAL  Result Date: 01/15/2021 CLINICAL DATA:  Acute kidney injury EXAM: RENAL / URINARY TRACT ULTRASOUND COMPLETE COMPARISON:  No prior renal  ultrasound. FINDINGS: Right Kidney: Renal measurements: 9.9 x 4.8 x 5.5 cm = volume: 137 mL. Echogenicity within normal limits. No mass or hydronephrosis visualized. Left Kidney: Renal measurements: 12.0 x 5.4 x 5.3 cm = volume: 177 mL. Echogenicity within normal limits. No mass or hydronephrosis visualized. Bladder: Appears normal for degree of bladder distention. Other: None. IMPRESSION: No acute finding in the kidneys and bladder. No etiology seen for the patient's AKI. Electronically Signed   By: Wiliam Ke M.D.   On: 01/15/2021 17:58   DG Chest Portable 1 View  Result Date: 01/15/2021 CLINICAL DATA:  Altered mental status, seizures EXAM: PORTABLE CHEST 1 VIEW COMPARISON:  03/19/2020 FINDINGS: Transverse diameter of heart is increased. Central pulmonary vessels are prominent. There is poor inspiration. Interstitial markings in the parahilar regions are prominent. Linear patchy infiltrate is seen in the right lower lung fields. There is no pleural effusion or pneumothorax. IMPRESSION: New patchy infiltrate is seen in the right lower lung fields suggesting atelectasis/pneumonia. There is slight prominence of interstitial markings in the parahilar regions which may be due to poor inspiration. There is no pleural effusion or pneumothorax. Electronically Signed   By: Ernie Avena M.D.   On: 01/15/2021 14:34        Medications:  Scheduled Meds:  acamprosate  666 mg Oral TID   amoxicillin-clavulanate  1 tablet Oral Q12H   aspirin EC  81 mg Oral Daily   chlorhexidine  15 mL Mouth Rinse BID   escitalopram  20 mg Oral Daily   folic acid  1 mg Oral Daily   lidocaine  1 patch Transdermal Q24H   LORazepam  0-4 mg Oral Q6H   Followed by   Melene Muller ON 01/17/2021] LORazepam  0-4 mg Oral Q12H   multivitamin with minerals  1 tablet Oral Daily   nicotine  21 mg Transdermal Daily   rosuvastatin  10 mg Oral QHS   thiamine  100 mg Oral Daily   Continuous Infusions:     LOS: 1 day    Time  spent: 35 minutes    Verdia Kuba, MD [facility]   To contact the attending provider between 7A-7P or the covering provider during after hours 7P-7A, please log into the web site [email or web address] and access using universal [place] password for that web site. If you do not have the password, please call the hospital operator.  01/16/2021, 1:50 PM

## 2021-01-16 NOTE — Progress Notes (Signed)
*  PRELIMINARY RESULTS* Echocardiogram 2D Echocardiogram has been performed.  Sherrie Sport 01/16/2021, 1:57 PM

## 2021-01-16 NOTE — Progress Notes (Signed)
Eeg done 

## 2021-01-16 NOTE — Progress Notes (Signed)
Patient to CT with transporter in stable condition. No other needs at this time.

## 2021-01-16 NOTE — Progress Notes (Signed)
PHARMACY - PHYSICIAN COMMUNICATION CRITICAL VALUE ALERT - BLOOD CULTURE IDENTIFICATION (BCID)  Joshua Mayo is an 47 y.o. male who presented to [facility] on 01/15/2021 with a chief complaint of seizure  Assessment:  1/12 blood culture with GPC In 1 of 4 bottles, BCID detects methicillin susceptible S. epidermidis  Name of physician (or Provider) Contacted: Dr Rowe Pavy  Current antibiotics: amoxicillin/clavulonate  Changes to prescribed antibiotics recommended:  Patient is on recommended antibiotics - No changes needed - blood culture likely contaminant  Results for orders placed or performed during the hospital encounter of 01/15/21  Blood Culture ID Panel (Reflexed) (Collected: 01/15/2021  3:16 PM)  Result Value Ref Range   Enterococcus faecalis NOT DETECTED NOT DETECTED   Enterococcus Faecium NOT DETECTED NOT DETECTED   Listeria monocytogenes NOT DETECTED NOT DETECTED   Staphylococcus species DETECTED (A) NOT DETECTED   Staphylococcus aureus (BCID) NOT DETECTED NOT DETECTED   Staphylococcus epidermidis DETECTED (A) NOT DETECTED   Staphylococcus lugdunensis NOT DETECTED NOT DETECTED   Streptococcus species NOT DETECTED NOT DETECTED   Streptococcus agalactiae NOT DETECTED NOT DETECTED   Streptococcus pneumoniae NOT DETECTED NOT DETECTED   Streptococcus pyogenes NOT DETECTED NOT DETECTED   A.calcoaceticus-baumannii NOT DETECTED NOT DETECTED   Bacteroides fragilis NOT DETECTED NOT DETECTED   Enterobacterales NOT DETECTED NOT DETECTED   Enterobacter cloacae complex NOT DETECTED NOT DETECTED   Escherichia coli NOT DETECTED NOT DETECTED   Klebsiella aerogenes NOT DETECTED NOT DETECTED   Klebsiella oxytoca NOT DETECTED NOT DETECTED   Klebsiella pneumoniae NOT DETECTED NOT DETECTED   Proteus species NOT DETECTED NOT DETECTED   Salmonella species NOT DETECTED NOT DETECTED   Serratia marcescens NOT DETECTED NOT DETECTED   Haemophilus influenzae NOT DETECTED NOT DETECTED   Neisseria  meningitidis NOT DETECTED NOT DETECTED   Pseudomonas aeruginosa NOT DETECTED NOT DETECTED   Stenotrophomonas maltophilia NOT DETECTED NOT DETECTED   Candida albicans NOT DETECTED NOT DETECTED   Candida auris NOT DETECTED NOT DETECTED   Candida glabrata NOT DETECTED NOT DETECTED   Candida krusei NOT DETECTED NOT DETECTED   Candida parapsilosis NOT DETECTED NOT DETECTED   Candida tropicalis NOT DETECTED NOT DETECTED   Cryptococcus neoformans/gattii NOT DETECTED NOT DETECTED   Methicillin resistance mecA/C NOT DETECTED NOT DETECTED    Doreene Eland, PharmD, BCPS, BCIDP Work Cell: [phone] 01/16/2021 3:13 PM

## 2021-01-16 NOTE — Procedures (Signed)
Patient Name: Joshua Mayo  MRN: [id number]  Epilepsy Attending: Charlsie Quest  Referring Physician/Provider: Verdia Kuba, DO Date: 01/16/2021 Duration: 29.44 mins  Patient history: 46 y.o. male PMHx alcohol abuse, tobacco abuse, alcoholic cardiomyopathy, acute respiratory failure with hypoxia 05/15/2018 admitted for seizure related to binge drinking a very large quantity of EtOH the previous night. EEG to evaluate for seizure.   Level of alertness: Awake  AEDs during EEG study: Ativan  Technical aspects: This EEG study was done with scalp electrodes positioned according to the 10-20 International system of electrode placement. Electrical activity was acquired at a sampling rate of 500Hz  and reviewed with a high frequency filter of 70Hz  and a low frequency filter of 1Hz . EEG data were recorded continuously and digitally stored.   Description: No posterior dominant rhythm was seen. There is an excessive amount of 15 to 18 Hz beta activity distributed symmetrically and diffusely. Physiologic photic driving was not seen during photic stimulation. Hyperventilation was not performed.     ABNORMALITY - Excessive beta, generalized  IMPRESSION: This study is within normal limits. The excessive beta activity seen in the background is most likely due to the effect of benzodiazepine and is a benign EEG pattern. No seizures or epileptiform discharges were seen throughout the recording.  [name] 

## 2021-01-17 LAB — COMPREHENSIVE METABOLIC PANEL
ALT: 15 U/L (ref 0–44)
AST: 15 U/L (ref 15–41)
Albumin: 3.3 g/dL — ABNORMAL LOW (ref 3.5–5.0)
Alkaline Phosphatase: 40 U/L (ref 38–126)
Anion gap: 9 (ref 5–15)
BUN: 21 mg/dL — ABNORMAL HIGH (ref 6–20)
CO2: 22 mmol/L (ref 22–32)
Calcium: 8.8 mg/dL — ABNORMAL LOW (ref 8.9–10.3)
Chloride: 107 mmol/L (ref 98–111)
Creatinine, Ser: 0.85 mg/dL (ref 0.61–1.24)
GFR, Estimated: >60 mL/min (ref >60)
Glucose, Bld: 91 mg/dL (ref 70–99)
Potassium: 3.6 mmol/L (ref 3.5–5.1)
Sodium: 138 mmol/L (ref 135–145)
Total Bilirubin: 0.9 mg/dL (ref 0.3–1.2)
Total Protein: 6 g/dL — ABNORMAL LOW (ref 6.5–8.1)

## 2021-01-17 LAB — CBC
HCT: 32.4 % — ABNORMAL LOW (ref 39.0–52.0)
Hemoglobin: 10.5 g/dL — ABNORMAL LOW (ref 13.0–17.0)
MCH: 30.9 pg (ref 26.0–34.0)
MCHC: 32.4 g/dL (ref 30.0–36.0)
MCV: 95.3 fL (ref 80.0–100.0)
Platelets: 192 10*3/uL (ref 150–400)
RBC: 3.4 MIL/uL — ABNORMAL LOW (ref 4.22–5.81)
RDW: 12.6 % (ref 11.5–15.5)
WBC: 4.8 10*3/uL (ref 4.0–10.5)
nRBC: 0 % (ref 0.0–0.2)

## 2021-01-17 MED ORDER — ENOXAPARIN SODIUM 40 MG/0.4ML IJ SOSY
40.0000 mg | PREFILLED_SYRINGE | INTRAMUSCULAR | Status: DC
  Administered 2021-01-17 – 2021-01-19 (×3): 40 mg via SUBCUTANEOUS
  Filled 2021-01-17 (×3): qty 0.4

## 2021-01-17 NOTE — Progress Notes (Addendum)
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  The patient presented to the emergency department after witnessed seizure at [facility].     Subjective:   This morning right sided rib pain w/ deep breathing, no fever or cough, no seizure-like activity, feels calm    Assessment and Plan:   Postictal encephalopathy: Resolved  Witnessed seizure: 2/2 etoh withdrawal. Neurology following in consultation.  Recommend no antiepileptic medication at this time.  Continue monitoring and administer Ativan as needed for any further seizure-like activity and calling neurology.  Continue seizure precautions.  Of note he was Keppra loaded in the ER.  Aspiration pneumonitis: Not hypoxic.  Noted on imaging, vs atelectasis.  Treated unasyn>augmentin. No fever or white count. Do not think patient has pneumonia, will d/c abx  Right rib pain: Secondary to fall. Obtain right rib imaging which shows possible nondisplaced fracture. Lidoderm patch ordered.    Fall: Secondary to witnessed seizure.  CT head and CT C-spine showed no acute findings.  History of CVA: MRI showed mild cortical infarct of the right parietal occipital region with additional small remote bilateral cerebellar infarcts left greater than right.  Continue home aspirin.  LDL was greater than 70.  We will start Crestor 5 mg daily.  Neurology ordering CTA head neck w/o sig findings and tte w/o thrombus.   Alcohol dependence: last drink 1/11. Continue CIWA protocol. Still requiring Ativan per CIWA protocol. Counseled on alcohol cessation.  Continue thiamine, folic acid and multivitamin.  Continue home Campral.  TOC consulted for substance abuse  education.   Lactic acidosis: Likely secondary to aspiration pneumonia or witnessed seizure.  Resolved with IV fluids  Acute kidney injury: Resolved with IV fluids.  Renal sonogram showed no obstructive uropathy.  Alcoholic cardiomyopathy: Last EF 40% improved to normal on repeat tte here. No acs symptoms but regional wall motion abnormalities seen.  Resumed home meds. Advise outpt cards f/u  Tobacco dependence: Counseled on smoking cessation.  Continue nicotine patch.   Other information:    DVT prophylaxis: lovenox Code Status: Full code Family Communication: requested I not call his wife Disposition:   Status is: Inpatient  Remains inpatient appropriate because: Witnessed seizure     Consultants:   Neurology    Objective:    Vitals:   01/16/21 2328 01/17/21 0421 01/17/21 0735 01/17/21 1144  BP: (!) 137/96 (!) 136/99 (!) 139/97 (!) 124/91  Pulse: 86 80 69 80  Resp: 16 16 18 18   Temp: 98.7 F (37.1 C) 98.4 F (36.9 C) 98.2 F (36.8 C) 98 F (36.7 C)  TempSrc: Oral Oral    SpO2: 99% 96% 98% 98%  Weight:      Height:        Intake/Output Summary (Last 24 hours) at 01/17/2021 1258 Last data filed at 01/17/2021 0900 Gross per 24 hour  Intake 120 ml  Output --  Net 120 ml   Filed Weights   01/15/21 1417  Weight: 81.6 kg       Physical Exam:    General exam: Appears calm and comfortable  Respiratory system: Clear to auscultation. Respiratory effort normal. Cardiovascular system: S1 & S2 heard, RRR. No JVD, murmurs, rubs, gallops or clicks. No pedal edema.  Gastrointestinal system: Abdomen is nondistended, soft and nontender. No organomegaly or masses felt. Normal bowel sounds heard. Central nervous system: Alert and oriented x4. No focal neurological deficits. Extremities: Symmetric 5 x 5 power. Skin: No rashes, lesions or ulcers Psychiatry: Judgement and insight appear normal. Mood & affect appropriate.     Data Reviewed:    I have  personally reviewed following labs and imaging studies  CBC: Recent Labs  Lab 01/15/21 1407 01/16/21 0659 01/17/21 0450  WBC 8.1 7.1 4.8  HGB 13.0 10.7* 10.5*  HCT 40.3 32.5* 32.4*  MCV 95.7 93.9 95.3  PLT 229 180 192    Basic Metabolic Panel: Recent Labs  Lab 01/15/21 1407 01/16/21 0659 01/17/21 0450  NA 140 137 138  K 3.9 3.8 3.6  CL 105 111 107  CO2 19* 20* 22  GLUCOSE 125* 80 91  BUN 31* 25* 21*  CREATININE 1.88* 1.10 0.85  CALCIUM 9.1 8.1* 8.8*  MG 2.0  --   --     GFR: Estimated Creatinine Clearance: 109.9 mL/min (by C-G formula based on SCr of 0.85 mg/dL).  Liver Function Tests: Recent Labs  Lab 01/15/21 1407 01/17/21 0450  AST 23 15  ALT 24 15  ALKPHOS 39 40  BILITOT 0.4 0.9  PROT 8.0 6.0*  ALBUMIN 4.1 3.3*    CBG: Recent Labs  Lab 01/15/21 1422 01/16/21 1452  GLUCAP 125* 119*     Recent Results (from the past 240 hour(s))  Resp Panel by RT-PCR (Flu A&B, Covid) Nasopharyngeal Swab     Status: None   Collection Time: 01/15/21  3:16 PM   Specimen: Nasopharyngeal Swab; Nasopharyngeal(NP) swabs in vial transport medium  Result Value Ref Range Status   SARS Coronavirus 2 by RT PCR NEGATIVE NEGATIVE Final    Comment: (NOTE) SARS-CoV-2 target nucleic acids are NOT DETECTED.  The SARS-CoV-2 RNA is generally detectable in upper respiratory specimens during the acute phase of infection. The lowest concentration of SARS-CoV-2 viral copies this assay can detect is 138 copies/mL. A negative result does not preclude SARS-Cov-2 infection and should not be used as the sole basis for treatment or other patient management decisions. A negative result may occur with  improper specimen collection/handling, submission of specimen other than nasopharyngeal swab, presence of viral mutation(s) within the areas targeted by this assay, and inadequate number of viral copies(<138 copies/mL). A negative result must be combined with clinical observations, patient  history, and epidemiological information. The expected result is Negative.  Fact Sheet for Patients:  BloggerCourse.comhttps://www.fda.gov/media/152166/download  Fact Sheet for Healthcare Providers:  SeriousBroker.ithttps://www.fda.gov/media/152162/download  This test is no t yet approved or cleared by the [place] FDA and  has been authorized for detection and/or diagnosis of SARS-CoV-2 by FDA under an Emergency Use Authorization (EUA). This EUA will remain  in effect (meaning this test can be used) for the duration of the COVID-19 declaration under Section 564(b)(1) of the Act, 21 U.S.C.section 360bbb-3(b)(1), unless the authorization is terminated  or revoked sooner.       Influenza A by PCR NEGATIVE NEGATIVE Final   Influenza B by PCR NEGATIVE NEGATIVE Final    Comment: (NOTE) The Xpert Xpress SARS-CoV-2/FLU/RSV plus assay is intended as an aid in the diagnosis of influenza from Nasopharyngeal swab specimens and should not be used as a sole basis for treatment. Nasal washings and aspirates are unacceptable for Xpert Xpress SARS-CoV-2/FLU/RSV testing.  Fact Sheet for Patients: BloggerCourse.comhttps://www.fda.gov/media/152166/download  Fact Sheet for Healthcare Providers: SeriousBroker.ithttps://www.fda.gov/media/152162/download  This test is not  yet approved or cleared by the [place] and has been authorized for detection and/or diagnosis of SARS-CoV-2 by FDA under an Emergency Use Authorization (EUA). This EUA will remain in effect (meaning this test can be used) for the duration of the COVID-19 declaration under Section 564(b)(1) of the Act, 21 U.S.C. section 360bbb-3(b)(1), unless the authorization is terminated or revoked.  Performed at [facility], [address] [place], [place] [address]   Blood culture (routine x 2)     Status: None (Preliminary result)   Collection Time: 01/15/21  3:16 PM   Specimen: BLOOD  Result Value Ref Range Status   Specimen Description   Final    BLOOD LEFT  ANTECUBITAL Performed at [facility], [address], [place], [place] [address]    Special Requests   Final    BOTTLES DRAWN AEROBIC AND ANAEROBIC Blood Culture adequate volume Performed at [facility], [address] [place], [place] [address]    Culture  Setup Time   Final    GRAM POSITIVE COCCI AEROBIC BOTTLE ONLY CRITICAL RESULT CALLED TO, READ BACK BY AND VERIFIED WITH: SUSAN WATSON AT 1508 01/16/21.PMF    Culture   Final    GRAM POSITIVE COCCI TOO YOUNG TO READ Performed at [facility], [address], [place], [place] [address]    Report Status PENDING  Incomplete  Blood culture (routine x 2)     Status: None (Preliminary result)   Collection Time: 01/15/21  3:16 PM   Specimen: BLOOD  Result Value Ref Range Status   Specimen Description BLOOD RIGHT ANTECUBITAL  Final   Special Requests   Final    BOTTLES DRAWN AEROBIC AND ANAEROBIC Blood Culture adequate volume   Culture   Final    NO GROWTH 2 DAYS Performed at [facility], [address] [place], [place] [address]    Report Status PENDING  Incomplete  Blood Culture ID Panel (Reflexed)     Status: Abnormal   Collection Time: 01/15/21  3:16 PM  Result Value Ref Range Status   Enterococcus faecalis NOT DETECTED NOT DETECTED Final   Enterococcus Faecium NOT DETECTED NOT DETECTED Final   Listeria monocytogenes NOT DETECTED NOT DETECTED Final   Staphylococcus species DETECTED (A) NOT DETECTED Final    Comment: CRITICAL RESULT CALLED TO, READ BACK BY AND VERIFIED WITH: SUSAN WATSON AT 1508 01/16/21.PMF    Staphylococcus aureus (BCID) NOT DETECTED NOT DETECTED Final   Staphylococcus epidermidis DETECTED (A) NOT DETECTED Final    Comment: CRITICAL RESULT CALLED TO, READ BACK BY AND VERIFIED WITH: SUSAN WATSON AT 1508 01/16/21.PMF    Staphylococcus lugdunensis NOT DETECTED NOT DETECTED Final   Streptococcus species NOT DETECTED NOT DETECTED Final   Streptococcus agalactiae NOT  DETECTED NOT DETECTED Final   Streptococcus pneumoniae NOT DETECTED NOT DETECTED Final   Streptococcus pyogenes NOT DETECTED NOT DETECTED Final   A.calcoaceticus-baumannii NOT DETECTED NOT DETECTED Final   Bacteroides fragilis NOT DETECTED NOT DETECTED Final   Enterobacterales NOT DETECTED NOT DETECTED Final   Enterobacter cloacae complex NOT DETECTED NOT DETECTED Final   Escherichia coli NOT DETECTED NOT DETECTED Final   Klebsiella aerogenes NOT DETECTED NOT DETECTED Final   Klebsiella oxytoca NOT DETECTED NOT DETECTED Final   Klebsiella pneumoniae NOT DETECTED NOT DETECTED Final   Proteus species NOT DETECTED NOT DETECTED Final   Salmonella species NOT DETECTED NOT DETECTED Final   Serratia marcescens NOT DETECTED NOT DETECTED Final   Haemophilus  influenzae NOT DETECTED NOT DETECTED Final   Neisseria meningitidis NOT DETECTED NOT DETECTED Final   Pseudomonas aeruginosa NOT DETECTED NOT DETECTED Final   Stenotrophomonas maltophilia NOT DETECTED NOT DETECTED Final   Candida albicans NOT DETECTED NOT DETECTED Final   Candida auris NOT DETECTED NOT DETECTED Final   Candida glabrata NOT DETECTED NOT DETECTED Final   Candida krusei NOT DETECTED NOT DETECTED Final   Candida parapsilosis NOT DETECTED NOT DETECTED Final   Candida tropicalis NOT DETECTED NOT DETECTED Final   Cryptococcus neoformans/gattii NOT DETECTED NOT DETECTED Final   Methicillin resistance mecA/C NOT DETECTED NOT DETECTED Final    Comment: Performed at [facility], [address] [place], [place] [address]         Radiology Studies:    CT ANGIO HEAD W OR WO CONTRAST  Result Date: 01/16/2021 CLINICAL DATA:  Stroke, follow up Neuro deficit, acute, stroke suspected; remotes stroke on MRI; EXAM: CT ANGIOGRAPHY HEAD AND NECK TECHNIQUE: Multidetector CT imaging of the head and neck was performed using the standard protocol during bolus administration of intravenous contrast. Multiplanar CT image  reconstructions and MIPs were obtained to evaluate the vascular anatomy. Carotid stenosis measurements (when applicable) are obtained utilizing NASCET criteria, using the distal internal carotid diameter as the denominator. RADIATION DOSE REDUCTION: This exam was performed according to the departmental dose-optimization program which includes automated exposure control, adjustment of the mA and/or kV according to patient size and/or use of iterative reconstruction technique. CONTRAST:  75mL OMNIPAQUE IOHEXOL 350 MG/ML SOLN COMPARISON:  Recent CT and MR imaging FINDINGS: CT HEAD Brain: There is no acute intracranial hemorrhage, mass effect, or edema. No new loss of gray-white differentiation. Small chronic infarct right occipital lobe. Small chronic superior cerebellar infarcts. No extra-axial collection. Ventricles and sulci are normal in size and configuration. Vascular: Better evaluated on CTA portion. Skull: Unremarkable. Sinuses/Orbits: No acute finding. Other: None. Review of the MIP images confirms the above findings CTA NECK Aortic arch: Great vessel origins are patent. Right carotid system: Patent. Minimal calcified plaque at the ICA origin without stenosis. Left carotid system: Patent. Minimal calcified plaque at the ICA origin without stenosis. Vertebral arteries: Patent and codominant.  No stenosis. Skeleton: No significant abnormality. Other neck: Unremarkable. Upper chest: No apical lung mass. Review of the MIP images confirms the above findings CTA HEAD Anterior circulation: Intracranial internal carotid arteries are patent. Anterior and middle cerebral arteries are patent. Anterior communicating artery is present. Posterior circulation: Intracranial vertebral arteries are patent. Basilar artery is patent. Major cerebellar artery origins are patent. Bilateral posterior communicating arteries are present. There is essentially fetal origin of the patent posterior cerebral arteries with bilateral  hypoplastic P1 segments. Venous sinuses: Patent as allowed by contrast bolus timing. Review of the MIP images confirms the above findings IMPRESSION: No acute intracranial abnormality. No large vessel occlusion, hemodynamically significant stenosis, or evidence of dissection. Electronically Signed   By: Guadlupe Spanish M.D.   On: 01/16/2021 14:17   DG Ribs Unilateral Right  Result Date: 01/16/2021 CLINICAL DATA:  Trauma, fall, pain EXAM: RIGHT RIBS - 2 VIEW COMPARISON:  Chest radiograph done on 01/15/2021 FINDINGS: No definite displaced fractures are seen. In 1 of the images, there is minimal cortical indentation in the lateral aspect of right ninth rib. There is no break in the cortical margins. There is a skin marker marking area of patient's symptoms which appears to be at the level of the right ninth rib. Visualized right lung is unremarkable.  IMPRESSION: There is minimal cortical indentation without radiolucent line in the lateral aspect of right ninth rib. This may suggest recent or old undisplaced fracture. Electronically Signed   By: Ernie Avena M.D.   On: 01/16/2021 16:07   CT HEAD WO CONTRAST ( )  Result Date: 01/15/2021 CLINICAL DATA:  Trauma EXAM: CT HEAD WITHOUT CONTRAST CT CERVICAL SPINE WITHOUT CONTRAST TECHNIQUE: Multidetector CT imaging of the head and cervical spine was performed following the standard protocol without intravenous contrast. Multiplanar CT image reconstructions of the cervical spine were also generated. RADIATION DOSE REDUCTION: This exam was performed according to the departmental dose-optimization program which includes automated exposure control, adjustment of the mA and/or kV according to patient size and/or use of iterative reconstruction technique. COMPARISON:  None. FINDINGS: CT HEAD FINDINGS Brain: No evidence of acute infarction, hemorrhage, hydrocephalus, extra-axial collection or mass lesion/mass effect. Vascular: No hyperdense vessel or unexpected  calcification. Skull: Normal. Negative for fracture or focal lesion. Sinuses/Orbits: No acute finding. Other: None. CT CERVICAL SPINE FINDINGS Alignment: Normal. Skull base and vertebrae: No acute fracture. No primary bone lesion or focal pathologic process. Soft tissues and spinal canal: No prevertebral fluid or swelling. No visible canal hematoma. Disc levels:  No significant degenerative changes. Upper chest: Bilateral dependent atelectasis. Other: None. IMPRESSION: 1. No acute intracranial abnormality. 2. No CT evidence of acute cervical spine injury. Electronically Signed   By: Allegra Lai M.D.   On: 01/15/2021 15:19   CT ANGIO NECK W OR WO CONTRAST  Result Date: 01/16/2021 CLINICAL DATA:  Stroke, follow up Neuro deficit, acute, stroke suspected; remotes stroke on MRI; EXAM: CT ANGIOGRAPHY HEAD AND NECK TECHNIQUE: Multidetector CT imaging of the head and neck was performed using the standard protocol during bolus administration of intravenous contrast. Multiplanar CT image reconstructions and MIPs were obtained to evaluate the vascular anatomy. Carotid stenosis measurements (when applicable) are obtained utilizing NASCET criteria, using the distal internal carotid diameter as the denominator. RADIATION DOSE REDUCTION: This exam was performed according to the departmental dose-optimization program which includes automated exposure control, adjustment of the mA and/or kV according to patient size and/or use of iterative reconstruction technique. CONTRAST:  75mL OMNIPAQUE IOHEXOL 350 MG/ML SOLN COMPARISON:  Recent CT and MR imaging FINDINGS: CT HEAD Brain: There is no acute intracranial hemorrhage, mass effect, or edema. No new loss of gray-white differentiation. Small chronic infarct right occipital lobe. Small chronic superior cerebellar infarcts. No extra-axial collection. Ventricles and sulci are normal in size and configuration. Vascular: Better evaluated on CTA portion. Skull: Unremarkable.  Sinuses/Orbits: No acute finding. Other: None. Review of the MIP images confirms the above findings CTA NECK Aortic arch: Great vessel origins are patent. Right carotid system: Patent. Minimal calcified plaque at the ICA origin without stenosis. Left carotid system: Patent. Minimal calcified plaque at the ICA origin without stenosis. Vertebral arteries: Patent and codominant.  No stenosis. Skeleton: No significant abnormality. Other neck: Unremarkable. Upper chest: No apical lung mass. Review of the MIP images confirms the above findings CTA HEAD Anterior circulation: Intracranial internal carotid arteries are patent. Anterior and middle cerebral arteries are patent. Anterior communicating artery is present. Posterior circulation: Intracranial vertebral arteries are patent. Basilar artery is patent. Major cerebellar artery origins are patent. Bilateral posterior communicating arteries are present. There is essentially fetal origin of the patent posterior cerebral arteries with bilateral hypoplastic P1 segments. Venous sinuses: Patent as allowed by contrast bolus timing. Review of the MIP images confirms the above findings IMPRESSION: No acute intracranial  abnormality. No large vessel occlusion, hemodynamically significant stenosis, or evidence of dissection. Electronically Signed   By: Guadlupe Spanish M.D.   On: 01/16/2021 14:17   CT Cervical Spine Wo Contrast  Result Date: 01/15/2021 CLINICAL DATA:  Trauma EXAM: CT HEAD WITHOUT CONTRAST CT CERVICAL SPINE WITHOUT CONTRAST TECHNIQUE: Multidetector CT imaging of the head and cervical spine was performed following the standard protocol without intravenous contrast. Multiplanar CT image reconstructions of the cervical spine were also generated. RADIATION DOSE REDUCTION: This exam was performed according to the departmental dose-optimization program which includes automated exposure control, adjustment of the mA and/or kV according to patient size and/or use of  iterative reconstruction technique. COMPARISON:  None. FINDINGS: CT HEAD FINDINGS Brain: No evidence of acute infarction, hemorrhage, hydrocephalus, extra-axial collection or mass lesion/mass effect. Vascular: No hyperdense vessel or unexpected calcification. Skull: Normal. Negative for fracture or focal lesion. Sinuses/Orbits: No acute finding. Other: None. CT CERVICAL SPINE FINDINGS Alignment: Normal. Skull base and vertebrae: No acute fracture. No primary bone lesion or focal pathologic process. Soft tissues and spinal canal: No prevertebral fluid or swelling. No visible canal hematoma. Disc levels:  No significant degenerative changes. Upper chest: Bilateral dependent atelectasis. Other: None. IMPRESSION: 1. No acute intracranial abnormality. 2. No CT evidence of acute cervical spine injury. Electronically Signed   By: Allegra Lai M.D.   On: 01/15/2021 15:19   MR BRAIN WO CONTRAST  Result Date: 01/16/2021 CLINICAL DATA:  Initial evaluation for acute seizure. EXAM: MRI HEAD WITHOUT CONTRAST TECHNIQUE: Multiplanar, multiecho pulse sequences of the brain and surrounding structures were obtained without intravenous contrast. COMPARISON:  Prior head CT from earlier the same day. FINDINGS: Brain: Examination moderately to severely degraded by motion artifact. Cerebral volume grossly within normal limits. Suspected at least mild chronic microvascular ischemic disease noted involving the periventricular white matter. Small remote cortical infarct present at the right parieto-occipital region. Additional small remote bilateral cerebellar infarcts, left larger than right. No visible foci of restricted diffusion to suggest acute or subacute ischemia or changes related to acute seizure. Gray-white matter differentiation otherwise maintained. No visible foci of susceptibility artifact to suggest acute or chronic intracranial hemorrhage. No mass lesion, midline shift or mass effect. Ventricles normal size without  hydrocephalus. No extra-axial fluid collection. Pituitary gland suprasellar region within normal limits. Midline structures intact and normal. No visible intrinsic temporal lobe abnormality. Vascular: Major intracranial vascular flow voids are grossly maintained at the skull base. Diminutive vertebrobasilar system noted. Skull and upper cervical spine: Craniocervical junction within normal limits. Bone marrow signal intensity normal. No scalp soft tissue abnormality. Sinuses/Orbits: Globes and orbital soft tissues grossly within normal limits. Paranasal sinuses are largely clear. No significant mastoid effusion. Other: None. IMPRESSION: 1. Motion degraded exam. 2. No acute intracranial abnormality. 3. 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 Electronically Signed   By: Rise Mu M.D.   On: 01/16/2021 04:26   US RENAL  Result Date: 01/15/2021 CLINICAL DATA:  Acute kidney injury EXAM: RENAL / URINARY TRACT ULTRASOUND COMPLETE COMPARISON:  No prior renal ultrasound. FINDINGS: Right Kidney: Renal measurements: 9.9 x 4.8 x 5.5 cm = volume: 137 mL. Echogenicity within normal limits. No mass or hydronephrosis visualized. Left Kidney: Renal measurements: 12.0 x 5.4 x 5.3 cm = volume: 177 mL. Echogenicity within normal limits. No mass or hydronephrosis visualized. Bladder: Appears normal for degree of bladder distention. Other: None. IMPRESSION: No acute finding in the  kidneys and bladder. No etiology seen for the patient's AKI. Electronically Signed   By: Wiliam Ke M.D.   On: 01/15/2021 17:58   DG Chest Portable 1 View  Result Date: 01/15/2021 CLINICAL DATA:  Altered mental status, seizures EXAM: PORTABLE CHEST 1 VIEW COMPARISON:  03/19/2020 FINDINGS: Transverse diameter of heart is increased. Central pulmonary vessels are prominent. There is poor inspiration. Interstitial markings in the  parahilar regions are prominent. Linear patchy infiltrate is seen in the right lower lung fields. There is no pleural effusion or pneumothorax. IMPRESSION: New patchy infiltrate is seen in the right lower lung fields suggesting atelectasis/pneumonia. There is slight prominence of interstitial markings in the parahilar regions which may be due to poor inspiration. There is no pleural effusion or pneumothorax. Electronically Signed   By: Ernie Avena M.D.   On: 01/15/2021 14:34   EEG adult  Result Date: 01/16/2021 Charlsie Quest, MD     01/16/2021  5:35 PM Patient Name: CAMERIN LADOUCEUR MRN: [id number] Epilepsy Attending: Charlsie Quest Referring Physician/Provider: Verdia Kuba, DO Date: 01/16/2021 Duration: 29.44 mins Patient history: 46 y.o. male PMHx alcohol abuse, tobacco abuse, alcoholic cardiomyopathy, acute respiratory failure with hypoxia 05/15/2018 admitted for seizure related to binge drinking a very large quantity of EtOH the previous night. EEG to evaluate for seizure. Level of alertness: Awake AEDs during EEG study: Ativan Technical aspects: This EEG study was done with scalp electrodes positioned according to the 10-20 International system of electrode placement. Electrical activity was acquired at a sampling rate of 500Hz  and reviewed with a high frequency filter of 70Hz  and a low frequency filter of 1Hz . EEG data were recorded continuously and digitally stored. Description: No posterior dominant rhythm was seen. There is an excessive amount of 15 to 18 Hz beta activity distributed symmetrically and diffusely. Physiologic photic driving was not seen during photic stimulation. Hyperventilation was not performed.   ABNORMALITY - Excessive beta, generalized IMPRESSION: This study is within normal limits. The excessive beta activity seen in the background is most likely due to the effect of benzodiazepine and is a benign EEG pattern. No seizures or epileptiform discharges were seen  throughout the recording.   ECHOCARDIOGRAM COMPLETE  Result Date: 01/16/2021    ECHOCARDIOGRAM REPORT   Patient Name:   SHIZUO BISKUP Date of Exam: 01/16/2021 Medical Rec #:  01/18/2021        Height:       67.0 in Accession #:    Leighton Roach       Weight:       180.0 lb Date of Birth:  [birth year]        BSA:          1.934 m Patient Age:    45 years         BP:           109/79 mmHg Patient Gender: M                HR:           105 bpm. Exam Location:  [facility] Procedure: 2D Echo, Color Doppler and Cardiac Doppler Indications:     Stroke I63.9  History:         Patient has prior history of Echocardiogram examinations, most                  recent 03/20/2020. CHF; Risk Factors:Hypertension.  Sonographer:     [id number] Referring Phys:  [phone]  HUNTER J COLLINS Diagnosing Phys: Sena Slateyan [place] IMPRESSIONS  1. Left ventricular ejection fraction, by estimation, is 55 to 60%. The left ventricle has normal function. The left ventricle demonstrates regional wall motion abnormalities (see scoring diagram/findings for description). The left ventricular internal cavity size was mildly dilated. Left ventricular diastolic function could not be evaluated.  2. Right ventricular systolic function is normal. The right ventricular size is normal.  3. The mitral valve is normal in structure. Mild mitral valve regurgitation. No evidence of mitral stenosis.  4. The aortic valve is normal in structure. Aortic valve regurgitation is not visualized. No aortic stenosis is present.  5. The inferior vena cava is normal in size with greater than 50% respiratory variability, suggesting right atrial pressure of 3 mmHg. FINDINGS  Left Ventricle: Left ventricular ejection fraction, by estimation, is 55 to 60%. The left ventricle has normal function. The left ventricle demonstrates regional wall motion abnormalities. Mild hypokinesis of the left ventricular, basal-mid inferior wall. The left ventricular internal cavity size was mildly  dilated. There is no left ventricular hypertrophy. Left ventricular diastolic function could not be evaluated.  LV Wall Scoring: The posterior wall and basal inferior segment are hypokinetic. Right Ventricle: The right ventricular size is normal. No increase in right ventricular wall thickness. Right ventricular systolic function is normal. Left Atrium: Left atrial size was normal in size. Right Atrium: Right atrial size was normal in size. Pericardium: There is no evidence of pericardial effusion. Mitral Valve: The mitral valve is normal in structure. Mild mitral valve regurgitation. No evidence of mitral valve stenosis. MV peak gradient, 3.9 mmHg. The mean mitral valve gradient is 2.0 mmHg. Tricuspid Valve: The tricuspid valve is normal in structure. Tricuspid valve regurgitation is mild. Aortic Valve: The aortic valve is normal in structure. Aortic valve regurgitation is not visualized. No aortic stenosis is present. Aortic valve mean gradient measures 3.0 mmHg. Aortic valve peak gradient measures 6.6 mmHg. Aortic valve area, by VTI measures 2.53 cm. Pulmonic Valve: The pulmonic valve was not well visualized. Pulmonic valve regurgitation is not visualized. Aorta: The aortic root and ascending aorta are structurally normal, with no evidence of dilitation. Venous: The inferior vena cava is normal in size with greater than 50% respiratory variability, suggesting right atrial pressure of 3 mmHg. IAS/Shunts: No atrial level shunt detected by color flow Doppler.  LEFT VENTRICLE PLAX 2D LVOT diam:     2.20 cm LV SV:         49 LV SV Index:   26 LVOT Area:     3.80 cm  RIGHT VENTRICLE RV S prime:     18.60 cm/s TAPSE (M-mode): 2.2 cm LEFT ATRIUM             Index LA Vol (A2C):   37.0 ml 19.13 ml/m LA Vol (A4C):   37.4 ml 19.34 ml/m LA Biplane Vol: 37.9 ml 19.60 ml/m  AORTIC VALVE                    PULMONIC VALVE AV Area (Vmax):    2.15 cm     PV Vmax:        0.77 m/s AV Area (Vmean):   2.19 cm     PV Vmean:        48.700 cm/s AV Area (VTI):     2.53 cm     PV VTI:         0.136 m AV Vmax:  128.00 cm/s  PV Peak grad:   2.4 mmHg AV Vmean:          84.200 cm/s  PV Mean grad:   1.0 mmHg AV VTI:            0.195 m      RVOT Peak grad: 3 mmHg AV Peak Grad:      6.6 mmHg AV Mean Grad:      3.0 mmHg LVOT Vmax:         72.50 cm/s LVOT Vmean:        48.500 cm/s LVOT VTI:          0.130 m LVOT/AV VTI ratio: 0.67 MITRAL VALVE               TRICUSPID VALVE MV Area (PHT): 8.16 cm    TR Peak grad:   12.8 mmHg MV Area VTI:   3.17 cm    TR Vmax:        179.00 cm/s MV Peak grad:  3.9 mmHg MV Mean grad:  2.0 mmHg    SHUNTS MV Vmax:       0.99 m/s    Systemic VTI:  0.13 m MV Vmean:      67.4 cm/s   Systemic Diam: 2.20 cm MV Decel Time: 93 msec     Pulmonic VTI:  0.175 m MV E velocity: 69.40 cm/s MV A velocity: 88.70 cm/s MV E/A ratio:  0.78 Sena Slate Electronically signed by Sena Slate Signature Date/Time: 01/16/2021/4:14:58 PM    Final         Medications:    Scheduled Meds:  acamprosate  666 mg Oral TID   amoxicillin-clavulanate  1 tablet Oral Q12H   aspirin EC  81 mg Oral Daily   carvedilol  25 mg Oral BID WC   chlorhexidine  15 mL Mouth Rinse BID   escitalopram  20 mg Oral Daily   folic acid  1 mg Oral Daily   lidocaine  1 patch Transdermal Q24H   LORazepam  0-4 mg Oral Q6H   Followed by   LORazepam  0-4 mg Oral Q12H   multivitamin with minerals  1 tablet Oral Daily   nicotine  21 mg Transdermal Daily   rosuvastatin  10 mg Oral QHS   sacubitril-valsartan  1 tablet Oral BID   spironolactone  50 mg Oral Daily   thiamine  100 mg Oral Daily   Continuous Infusions:     LOS: 2 days    Time spent: 35 minutes    Silvano Bilis, MD [facility]   To contact the attending provider between 7A-7P or the covering provider during after hours 7P-7A, please log into the web site [email or web address] and access using universal [place] password for that web site. If you do not have the password, please  call the hospital operator.  01/17/2021, 12:58 PM

## 2021-01-17 NOTE — Plan of Care (Signed)
Alert, oriented, no seizure activity noted during the duration of my shift. CIWA scoring did not require Ativan doses, see MAR for details.  Patient was able to ambulate independently in room, out of bed to chair for portion of my shift. Medicated for pain as needed, see MAR.   Problem: Education: Goal: Expressions of having a comfortable level of knowledge regarding the disease process will increase Outcome: Progressing   Problem: Coping: Goal: Ability to adjust to condition or change in health will improve Outcome: Progressing   Problem: Health Behavior/Discharge Planning: Goal: Compliance with prescribed medication regimen will improve Outcome: Progressing   Problem: Education: Goal: Knowledge of General Education information will improve Description: Including pain rating scale, medication(s)/side effects and non-pharmacologic comfort measures Outcome: Progressing   Problem: Health Behavior/Discharge Planning: Goal: Ability to manage health-related needs will improve Outcome: Progressing

## 2021-01-17 NOTE — TOC CM/SW Note (Signed)
TOC consult for [facility] resources. Left packet to be included in paperwork when patient is DC.  Oleh Genin, [place]

## 2021-01-18 LAB — CULTURE, BLOOD (ROUTINE X 2): Special Requests: ADEQUATE

## 2021-01-18 NOTE — Progress Notes (Addendum)
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  Patient presents to the ED after witnessed seizure episode.   Assessment & Plan:   Principal Problem:   Seizure (HCC) Active Problems:   Alcohol abuse   Tobacco abuse   Aspiration pneumonia (HCC)   Alcoholic cardiomyopathy (HCC)   Chronic systolic CHF (congestive heart failure) (HCC)   AKI (acute kidney injury) (HCC)   Lactic acidosis   1-Seizure:  Witnessed seizure secondary to alcohol withdrawal Neurology following, no recommendation for antiepileptic medication Patient did receive Keppra loaded in the ED. MRI: No acute abnormalities. Old strokes.  No further episode. EEG; Normal Limits.   2-Post Ictal Encephalopathy:  Back to baseline resolved.  Right Rib pain:  Continue with Lidoderm patch. Incentive spirometry ordered.   Fall:  Secondary to weakness seizure.  CT head and CT spine no acute finding.  History of CVA;  Continue with aspirin, started on Crestor. LDL 1270. A1c 4.7 MRI; No acute intracranial abnormality. 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 CTA head neck: No acute large vessel occlusion.   Alcohol Dependence//alcohol withdrawal: Continue with CIWA protocol.  CIWA score at 11. Continue home Campral  1 of 2 Blood culture positive for staph epi.  -Likely contaminate.  -Repeat Blood culture.   Lactic acidosis:  Likely secondary to seizure.  Resolved with IV fluids.  Alcoholic Cardiomyopathy:  Last ejection fraction 40%.  Repeated echo improve ejection fraction.  Continue Continue with Entresto and spironolactone.    Tobacco Dependence.  Counseled  Aspiration pneumonitis: Rule out. Anemia; probably of chronic diseases. Check iron level.  Hb stable.   Estimated body mass index is 28.19 kg/m as calculated from the following:   Height as of this encounter: 5\' 7"  (1.702 m).   Weight as of this encounter: 81.6 kg.   DVT prophylaxis: Lovenox Code Status: Full Code Family Communication: Care discussed with patient.  Disposition Plan:  Status is: Inpatient  Remains inpatient appropriate because: Patient admitted with witnessed seizure, encephalopathy, alcohol withdrawal.  Home 1/16   Consultants:  Neurology   Procedures:  ECHO: Ejection fraction 55 to 60%, left ventricle demonstrates regional wall motion abnormality, left ventricular diastolic function could not be evaluated.  Right ventricular systolic function is normal.  Mild mitral valve regurgitation.  Antimicrobials:    Subjective: He is alert, mild tremors.  Complaints of ribs pain.  He think he pass out, he doesn't think he had seizure.    Objective: Vitals:   01/17/21 2101 01/18/21 0012 01/18/21 0430 01/18/21 0730  BP: (!) 124/91 115/77 114/83 (!) 130/99  Pulse: 84 80 72 74  Resp: 18 18 17 16   Temp: (!) 97.5 F (36.4 C) 98.1 F (36.7 C) 97.8 F (36.6 C) 98.1 F (36.7 C)  TempSrc: Oral Oral Oral Oral  SpO2: 100% 97% 99% 99%  Weight:      Height:        Intake/Output Summary (Last 24 hours) at 01/18/2021 0751 Last data filed at 01/17/2021 1700 Gross per 24 hour  Intake 600 ml  Output --  Net 600 ml   Filed Weights   01/15/21 1417  Weight: 81.6 kg    Examination:  General exam: Appears calm and comfortable  Respiratory  system: Clear to auscultation. Respiratory effort normal. Cardiovascular system: S1 & S2 heard, RRR. No JVD, murmurs, rubs, gallops or clicks. No pedal edema. Gastrointestinal system: Abdomen is nondistended, soft and nontender. No organomegaly or masses felt. Normal bowel sounds heard. Central  nervous system: Alert and oriented. No focal neurological deficits. Extremities: Symmetric 5 x 5 power.   Data Reviewed: I have personally reviewed following labs and imaging studies  CBC: Recent Labs  Lab 01/15/21 1407 01/16/21 0659 01/17/21 0450  WBC 8.1 7.1 4.8  HGB 13.0 10.7* 10.5*  HCT 40.3 32.5* 32.4*  MCV 95.7 93.9 95.3  PLT 229 180 [id number]   Basic Metabolic Panel: Recent Labs  Lab 01/15/21 1407 01/16/21 0659 01/17/21 0450  NA 140 137 138  K 3.9 3.8 3.6  CL 105 111 107  CO2 19* 20* 22  GLUCOSE 125* 80 91  BUN 31* 25* 21*  CREATININE 1.88* 1.10 0.85  CALCIUM 9.1 8.1* 8.8*  MG 2.0  --   --    GFR: Estimated Creatinine Clearance: 109.9 mL/min (by C-G formula based on SCr of 0.85 mg/dL). Liver Function Tests: Recent Labs  Lab 01/15/21 1407 01/17/21 0450  AST 23 15  ALT 24 15  ALKPHOS 39 40  BILITOT 0.4 0.9  PROT 8.0 6.0*  ALBUMIN 4.1 3.3*   No results for input(s): LIPASE, AMYLASE in the last 168 hours. No results for input(s): AMMONIA in the last 168 hours. Coagulation Profile: No results for input(s): INR, PROTIME in the last 168 hours. Cardiac Enzymes: Recent Labs  Lab 01/15/21 1730  CKTOTAL 66   BNP (last 3 results) No results for input(s): PROBNP in the last 8760 hours. HbA1C: Recent Labs    01/16/21 0659  HGBA1C 4.7*   CBG: Recent Labs  Lab 01/15/21 1422 01/16/21 1452  GLUCAP 125* 119*   Lipid Profile: Recent Labs    01/16/21 0659  CHOL 195  HDL 56  LDLCALC 127*  TRIG 61  CHOLHDL 3.5   Thyroid Function Tests: No results for input(s): TSH, T4TOTAL, FREET4, T3FREE, THYROIDAB in the last 72 hours. Anemia Panel: No results for input(s): VITAMINB12, FOLATE, FERRITIN, TIBC, IRON, RETICCTPCT in the last 72 hours. Sepsis Labs: Recent Labs  Lab 01/15/21 1516 01/15/21 1730 01/15/21 2154  LATICACIDVEN 3.5* 2.4* 2.4*    Recent Results (from the past 240 hour(s))  Resp Panel by RT-PCR (Flu A&B, Covid) Nasopharyngeal Swab      Status: None   Collection Time: 01/15/21  3:16 PM   Specimen: Nasopharyngeal Swab; Nasopharyngeal(NP) swabs in vial transport medium  Result Value Ref Range Status   SARS Coronavirus 2 by RT PCR NEGATIVE NEGATIVE Final    Comment: (NOTE) SARS-CoV-2 target nucleic acids are NOT DETECTED.  The SARS-CoV-2 RNA is generally detectable in upper respiratory specimens during the acute phase of infection. The lowest concentration of SARS-CoV-2 viral copies this assay can detect is 138 copies/mL. A negative result does not preclude SARS-Cov-2 infection and should not be used as the sole basis for treatment or other patient management decisions. A negative result may occur with  improper specimen collection/handling, submission of specimen other than nasopharyngeal swab, presence of viral mutation(s) within the areas targeted by this assay, and inadequate number of viral copies(<138 copies/mL). A negative result must be combined with clinical observations, patient history, and epidemiological information. The expected result is Negative.  Fact Sheet for Patients:  EntrepreneurPulse.com.au  Fact Sheet for Healthcare Providers:  IncredibleEmployment.be  This test is no t yet approved or  cleared by the [place] and  has been authorized for detection and/or diagnosis of SARS-CoV-2 by FDA under an Emergency Use Authorization (EUA). This EUA will remain  in effect (meaning this test can be used) for the duration of the COVID-19 declaration under Section 564(b)(1) of the Act, 21 U.S.C.section 360bbb-3(b)(1), unless the authorization is terminated  or revoked sooner.       Influenza A by PCR NEGATIVE NEGATIVE Final   Influenza B by PCR NEGATIVE NEGATIVE Final    Comment: (NOTE) The Xpert Xpress SARS-CoV-2/FLU/RSV plus assay is intended as an aid in the diagnosis of influenza from Nasopharyngeal swab specimens and should not be used as a sole basis  for treatment. Nasal washings and aspirates are unacceptable for Xpert Xpress SARS-CoV-2/FLU/RSV testing.  Fact Sheet for Patients: EntrepreneurPulse.com.au  Fact Sheet for Healthcare Providers: IncredibleEmployment.be  This test is not yet approved or cleared by the [place] FDA and has been authorized for detection and/or diagnosis of SARS-CoV-2 by FDA under an Emergency Use Authorization (EUA). This EUA will remain in effect (meaning this test can be used) for the duration of the COVID-19 declaration under Section 564(b)(1) of the Act, 21 U.S.C. section 360bbb-3(b)(1), unless the authorization is terminated or revoked.  Performed at [facility], [address] [place], [place] [address]   Blood culture (routine x 2)     Status: None (Preliminary result)   Collection Time: 01/15/21  3:16 PM   Specimen: BLOOD  Result Value Ref Range Status   Specimen Description   Final    BLOOD LEFT ANTECUBITAL Performed at [facility], [address] [place], [place] [address]    Special Requests   Final    BOTTLES DRAWN AEROBIC AND ANAEROBIC Blood Culture adequate volume Performed at [facility], [address] [place], [place] [address]    Culture  Setup Time   Final    GRAM POSITIVE COCCI AEROBIC BOTTLE ONLY CRITICAL RESULT CALLED TO, READ BACK BY AND VERIFIED WITH: SUSAN WATSON AT 1508 01/16/21.PMF    Culture   Final    GRAM POSITIVE COCCI TOO YOUNG TO READ Performed at [facility], [place] [address] [place], [place] [address]    Report Status PENDING  Incomplete  Blood culture (routine x 2)     Status: None (Preliminary result)   Collection Time: 01/15/21  3:16 PM   Specimen: BLOOD  Result Value Ref Range Status   Specimen Description BLOOD RIGHT ANTECUBITAL  Final   Special Requests   Final    BOTTLES DRAWN AEROBIC AND ANAEROBIC Blood Culture adequate volume   Culture   Final    NO GROWTH 3  DAYS Performed at [facility], [address] [place],  [address]    Report Status PENDING  Incomplete  Blood Culture ID Panel (Reflexed)     Status: Abnormal   Collection Time: 01/15/21  3:16 PM  Result Value Ref Range Status   Enterococcus faecalis NOT DETECTED NOT DETECTED Final   Enterococcus Faecium NOT DETECTED NOT DETECTED Final   Listeria monocytogenes NOT DETECTED NOT DETECTED Final   Staphylococcus species DETECTED (A) NOT DETECTED Final    Comment: CRITICAL RESULT CALLED TO, READ BACK BY AND VERIFIED WITH: SUSAN WATSON AT 1508 01/16/21.PMF    Staphylococcus aureus (BCID) NOT DETECTED NOT DETECTED Final   Staphylococcus epidermidis DETECTED (A) NOT DETECTED Final    Comment: CRITICAL RESULT CALLED TO, READ BACK BY AND VERIFIED WITH: SUSAN WATSON  AT 1508 01/16/21.PMF    Staphylococcus lugdunensis NOT DETECTED NOT DETECTED Final   Streptococcus species NOT DETECTED NOT DETECTED Final   Streptococcus agalactiae NOT DETECTED NOT DETECTED Final   Streptococcus pneumoniae NOT DETECTED NOT DETECTED Final   Streptococcus pyogenes NOT DETECTED NOT DETECTED Final   A.calcoaceticus-baumannii NOT DETECTED NOT DETECTED Final   Bacteroides fragilis NOT DETECTED NOT DETECTED Final   Enterobacterales NOT DETECTED NOT DETECTED Final   Enterobacter cloacae complex NOT DETECTED NOT DETECTED Final   Escherichia coli NOT DETECTED NOT DETECTED Final   Klebsiella aerogenes NOT DETECTED NOT DETECTED Final   Klebsiella oxytoca NOT DETECTED NOT DETECTED Final   Klebsiella pneumoniae NOT DETECTED NOT DETECTED Final   Proteus species NOT DETECTED NOT DETECTED Final   Salmonella species NOT DETECTED NOT DETECTED Final   Serratia marcescens NOT DETECTED NOT DETECTED Final   Haemophilus influenzae NOT DETECTED NOT DETECTED Final   Neisseria meningitidis NOT DETECTED NOT DETECTED Final   Pseudomonas aeruginosa NOT DETECTED NOT DETECTED Final   Stenotrophomonas maltophilia NOT  DETECTED NOT DETECTED Final   Candida albicans NOT DETECTED NOT DETECTED Final   Candida auris NOT DETECTED NOT DETECTED Final   Candida glabrata NOT DETECTED NOT DETECTED Final   Candida krusei NOT DETECTED NOT DETECTED Final   Candida parapsilosis NOT DETECTED NOT DETECTED Final   Candida tropicalis NOT DETECTED NOT DETECTED Final   Cryptococcus neoformans/gattii NOT DETECTED NOT DETECTED Final   Methicillin resistance mecA/C NOT DETECTED NOT DETECTED Final    Comment: Performed at [facility], [address] [place], [place] [address]         Radiology Studies: CT ANGIO HEAD W OR WO CONTRAST  Result Date: 01/16/2021 CLINICAL DATA:  Stroke, follow up Neuro deficit, acute, stroke suspected; remotes stroke on MRI; EXAM: CT ANGIOGRAPHY HEAD AND NECK TECHNIQUE: Multidetector CT imaging of the head and neck was performed using the standard protocol during bolus administration of intravenous contrast. Multiplanar CT image reconstructions and MIPs were obtained to evaluate the vascular anatomy. Carotid stenosis measurements (when applicable) are obtained utilizing NASCET criteria, using the distal internal carotid diameter as the denominator. RADIATION DOSE REDUCTION: This exam was performed according to the departmental dose-optimization program which includes automated exposure control, adjustment of the mA and/or kV according to patient size and/or use of iterative reconstruction technique. CONTRAST:  69mL OMNIPAQUE IOHEXOL 350 MG/ML SOLN COMPARISON:  Recent CT and MR imaging FINDINGS: CT HEAD Brain: There is no acute intracranial hemorrhage, mass effect, or edema. No new loss of gray-white differentiation. Small chronic infarct right occipital lobe. Small chronic superior cerebellar infarcts. No extra-axial collection. Ventricles and sulci are normal in size and configuration. Vascular: Better evaluated on CTA portion. Skull: Unremarkable. Sinuses/Orbits: No acute finding. Other: None.  Review of the MIP images confirms the above findings CTA NECK Aortic arch: Great vessel origins are patent. Right carotid system: Patent. Minimal calcified plaque at the ICA origin without stenosis. Left carotid system: Patent. Minimal calcified plaque at the ICA origin without stenosis. Vertebral arteries: Patent and codominant.  No stenosis. Skeleton: No significant abnormality. Other neck: Unremarkable. Upper chest: No apical lung mass. Review of the MIP images confirms the above findings CTA HEAD Anterior circulation: Intracranial internal carotid arteries are patent. Anterior and middle cerebral arteries are patent. Anterior communicating artery is present. Posterior circulation: Intracranial vertebral arteries are patent. Basilar artery is patent. Major cerebellar artery origins are patent. Bilateral posterior communicating arteries are present. There is essentially fetal origin of  the patent posterior cerebral arteries with bilateral hypoplastic P1 segments. Venous sinuses: Patent as allowed by contrast bolus timing. Review of the MIP images confirms the above findings IMPRESSION: No acute intracranial abnormality. No large vessel occlusion, hemodynamically significant stenosis, or evidence of dissection. Electronically Signed   By: Macy Mis M.D.   On: 01/16/2021 14:17   DG Ribs Unilateral Right  Result Date: 01/16/2021 CLINICAL DATA:  Trauma, fall, pain EXAM: RIGHT RIBS - 2 VIEW COMPARISON:  Chest radiograph done on 01/15/2021 FINDINGS: No definite displaced fractures are seen. In 1 of the images, there is minimal cortical indentation in the lateral aspect of right ninth rib. There is no break in the cortical margins. There is a skin marker marking area of patient's symptoms which appears to be at the level of the right ninth rib. Visualized right lung is unremarkable. IMPRESSION: There is minimal cortical indentation without radiolucent line in the lateral aspect of right ninth rib. This may  suggest recent or old undisplaced fracture. Electronically Signed   By: Elmer Picker M.D.   On: 01/16/2021 16:07   CT ANGIO NECK W OR WO CONTRAST  Result Date: 01/16/2021 CLINICAL DATA:  Stroke, follow up Neuro deficit, acute, stroke suspected; remotes stroke on MRI; EXAM: CT ANGIOGRAPHY HEAD AND NECK TECHNIQUE: Multidetector CT imaging of the head and neck was performed using the standard protocol during bolus administration of intravenous contrast. Multiplanar CT image reconstructions and MIPs were obtained to evaluate the vascular anatomy. Carotid stenosis measurements (when applicable) are obtained utilizing NASCET criteria, using the distal internal carotid diameter as the denominator. RADIATION DOSE REDUCTION: This exam was performed according to the departmental dose-optimization program which includes automated exposure control, adjustment of the mA and/or kV according to patient size and/or use of iterative reconstruction technique. CONTRAST:  58mL OMNIPAQUE IOHEXOL 350 MG/ML SOLN COMPARISON:  Recent CT and MR imaging FINDINGS: CT HEAD Brain: There is no acute intracranial hemorrhage, mass effect, or edema. No new loss of gray-white differentiation. Small chronic infarct right occipital lobe. Small chronic superior cerebellar infarcts. No extra-axial collection. Ventricles and sulci are normal in size and configuration. Vascular: Better evaluated on CTA portion. Skull: Unremarkable. Sinuses/Orbits: No acute finding. Other: None. Review of the MIP images confirms the above findings CTA NECK Aortic arch: Great vessel origins are patent. Right carotid system: Patent. Minimal calcified plaque at the ICA origin without stenosis. Left carotid system: Patent. Minimal calcified plaque at the ICA origin without stenosis. Vertebral arteries: Patent and codominant.  No stenosis. Skeleton: No significant abnormality. Other neck: Unremarkable. Upper chest: No apical lung mass. Review of the MIP images  confirms the above findings CTA HEAD Anterior circulation: Intracranial internal carotid arteries are patent. Anterior and middle cerebral arteries are patent. Anterior communicating artery is present. Posterior circulation: Intracranial vertebral arteries are patent. Basilar artery is patent. Major cerebellar artery origins are patent. Bilateral posterior communicating arteries are present. There is essentially fetal origin of the patent posterior cerebral arteries with bilateral hypoplastic P1 segments. Venous sinuses: Patent as allowed by contrast bolus timing. Review of the MIP images confirms the above findings IMPRESSION: No acute intracranial abnormality. No large vessel occlusion, hemodynamically significant stenosis, or evidence of dissection. Electronically Signed   By: Macy Mis M.D.   On: 01/16/2021 14:17   EEG adult  Result Date: 01/16/2021 Lora Havens, MD     01/16/2021  5:35 PM Patient Name: Joshua Mayo MRN: [id number] Epilepsy Attending: Lora Havens Referring Physician/Provider: Imagene Sheller  S, DO Date: 01/16/2021 Duration: 29.44 mins Patient history: 46 y.o. male PMHx alcohol abuse, tobacco abuse, alcoholic cardiomyopathy, acute respiratory failure with hypoxia 05/15/2018 admitted for seizure related to binge drinking a very large quantity of EtOH the previous night. EEG to evaluate for seizure. Level of alertness: Awake AEDs during EEG study: Ativan Technical aspects: This EEG study was done with scalp electrodes positioned according to the 10-20 International system of electrode placement. Electrical activity was acquired at a sampling rate of 500Hz  and reviewed with a high frequency filter of 70Hz  and a low frequency filter of 1Hz . EEG data were recorded continuously and digitally stored. Description: No posterior dominant rhythm was seen. There is an excessive amount of 15 to 18 Hz beta activity distributed symmetrically and diffusely. Physiologic photic driving was not  seen during photic stimulation. Hyperventilation was not performed.   ABNORMALITY - Excessive beta, generalized IMPRESSION: This study is within normal limits. The excessive beta activity seen in the background is most likely due to the effect of benzodiazepine and is a benign EEG pattern. No seizures or epileptiform discharges were seen throughout the recording. Lora Havens   ECHOCARDIOGRAM COMPLETE  Result Date: 01/16/2021    ECHOCARDIOGRAM REPORT   Patient Name:   DANIIL BOERMAN Date of Exam: 01/16/2021 Medical Rec #:  [id number]        Height:       67.0 in Accession #:    [id number]       Weight:       180.0 lb Date of Birth:  [birth year]        BSA:          1.934 m Patient Age:    72 years         BP:           109/79 mmHg Patient Gender: M                HR:           105 bpm. Exam Location:  [facility] Procedure: 2D Echo, Color Doppler and Cardiac Doppler Indications:     Stroke I63.9  History:         Patient has prior history of Echocardiogram examinations, most                  recent 03/20/2020. CHF; Risk Factors:Hypertension.  Sonographer:     Sherrie Sport Referring Phys:  [id number] HUNTER J COLLINS Diagnosing Phys: Donnelly Angelica IMPRESSIONS  1. Left ventricular ejection fraction, by estimation, is 55 to 60%. The left ventricle has normal function. The left ventricle demonstrates regional wall motion abnormalities (see scoring diagram/findings for description). The left ventricular internal cavity size was mildly dilated. Left ventricular diastolic function could not be evaluated.  2. Right ventricular systolic function is normal. The right ventricular size is normal.  3. The mitral valve is normal in structure. Mild mitral valve regurgitation. No evidence of mitral stenosis.  4. The aortic valve is normal in structure. Aortic valve regurgitation is not visualized. No aortic stenosis is present.  5. The inferior vena cava is normal in size with greater than 50% respiratory variability, suggesting right  atrial pressure of 3 mmHg. FINDINGS  Left Ventricle: Left ventricular ejection fraction, by estimation, is 55 to 60%. The left ventricle has normal function. The left ventricle demonstrates regional wall motion abnormalities. Mild hypokinesis of the left ventricular, basal-mid inferior wall. The left ventricular internal cavity size was mildly dilated. There is no left  ventricular hypertrophy. Left ventricular diastolic function could not be evaluated.  LV Wall Scoring: The posterior wall and basal inferior segment are hypokinetic. Right Ventricle: The right ventricular size is normal. No increase in right ventricular wall thickness. Right ventricular systolic function is normal. Left Atrium: Left atrial size was normal in size. Right Atrium: Right atrial size was normal in size. Pericardium: There is no evidence of pericardial effusion. Mitral Valve: The mitral valve is normal in structure. Mild mitral valve regurgitation. No evidence of mitral valve stenosis. MV peak gradient, 3.9 mmHg. The mean mitral valve gradient is 2.0 mmHg. Tricuspid Valve: The tricuspid valve is normal in structure. Tricuspid valve regurgitation is mild. Aortic Valve: The aortic valve is normal in structure. Aortic valve regurgitation is not visualized. No aortic stenosis is present. Aortic valve mean gradient measures 3.0 mmHg. Aortic valve peak gradient measures 6.6 mmHg. Aortic valve area, by VTI measures 2.53 cm. Pulmonic Valve: The pulmonic valve was not well visualized. Pulmonic valve regurgitation is not visualized. Aorta: The aortic root and ascending aorta are structurally normal, with no evidence of dilitation. Venous: The inferior vena cava is normal in size with greater than 50% respiratory variability, suggesting right atrial pressure of 3 mmHg. IAS/Shunts: No atrial level shunt detected by color flow Doppler.  LEFT VENTRICLE PLAX 2D LVOT diam:     2.20 cm LV SV:         49 LV SV Index:   26 LVOT Area:     3.80 cm  RIGHT  VENTRICLE RV S prime:     18.60 cm/s TAPSE (M-mode): 2.2 cm LEFT ATRIUM             Index LA Vol (A2C):   37.0 ml 19.13 ml/m LA Vol (A4C):   37.4 ml 19.34 ml/m LA Biplane Vol: 37.9 ml 19.60 ml/m  AORTIC VALVE                    PULMONIC VALVE AV Area (Vmax):    2.15 cm     PV Vmax:        0.77 m/s AV Area (Vmean):   2.19 cm     PV Vmean:       48.700 cm/s AV Area (VTI):     2.53 cm     PV VTI:         0.136 m AV Vmax:           128.00 cm/s  PV Peak grad:   2.4 mmHg AV Vmean:          84.200 cm/s  PV Mean grad:   1.0 mmHg AV VTI:            0.195 m      RVOT Peak grad: 3 mmHg AV Peak Grad:      6.6 mmHg AV Mean Grad:      3.0 mmHg LVOT Vmax:         72.50 cm/s LVOT Vmean:        48.500 cm/s LVOT VTI:          0.130 m LVOT/AV VTI ratio: 0.67 MITRAL VALVE               TRICUSPID VALVE MV Area (PHT): 8.16 cm    TR Peak grad:   12.8 mmHg MV Area VTI:   3.17 cm    TR Vmax:        179.00 cm/s MV Peak grad:  3.9 mmHg MV Mean grad:  2.0 mmHg    SHUNTS MV Vmax:       0.99 m/s    Systemic VTI:  0.13 m MV Vmean:      67.4 cm/s   Systemic Diam: 2.20 cm MV Decel Time: 93 msec     Pulmonic VTI:  0.175 m MV E velocity: 69.40 cm/s MV A velocity: 88.70 cm/s MV E/A ratio:  0.78 Donnelly Angelica Electronically signed by Donnelly Angelica Signature Date/Time: 01/16/2021/4:14:58 PM    Final         Scheduled Meds:  acamprosate  666 mg Oral TID   aspirin EC  81 mg Oral Daily   carvedilol  25 mg Oral BID WC   chlorhexidine  15 mL Mouth Rinse BID   enoxaparin (LOVENOX) injection  40 mg Subcutaneous Q24H   escitalopram  20 mg Oral Daily   folic acid  1 mg Oral Daily   lidocaine  1 patch Transdermal Q24H   LORazepam  0-4 mg Oral Q12H   multivitamin with minerals  1 tablet Oral Daily   nicotine  21 mg Transdermal Daily   rosuvastatin  10 mg Oral QHS   sacubitril-valsartan  1 tablet Oral BID   spironolactone  50 mg Oral Daily   thiamine  100 mg Oral Daily   Continuous Infusions:   LOS: 3 days    Time spent: 35 minutes.      Elmarie Shiley, MD [facility]   If 7PM-7AM, please contact night-coverage [email or web address]  01/18/2021, 7:51 AM

## 2021-01-19 LAB — IRON AND TIBC
Iron: 58 ug/dL (ref 45–182)
Saturation Ratios: 19 % (ref 17.9–39.5)
TIBC: 312 ug/dL (ref 250–450)
UIBC: 254 ug/dL

## 2021-01-19 LAB — VITAMIN B12: Vitamin B-12: 918 pg/mL — ABNORMAL HIGH (ref 180–914)

## 2021-01-19 LAB — FERRITIN: Ferritin: 295 ng/mL (ref 24–336)

## 2021-01-19 MED ORDER — ROSUVASTATIN CALCIUM 10 MG PO TABS: 10.0000 mg | ORAL_TABLET | Freq: Every day | ORAL | 2 refills | Status: DC

## 2021-01-19 MED ORDER — THIAMINE HCL 100 MG PO TABS: 100.0000 mg | ORAL_TABLET | Freq: Every day | ORAL | 1 refills | Status: DC

## 2021-01-19 MED ORDER — HYDROCODONE-ACETAMINOPHEN 5-325 MG PO TABS
1.0000 | ORAL_TABLET | Freq: Three times a day (TID) | ORAL | Status: DC | PRN
  Administered 2021-01-19 (×2): 1 via ORAL
  Filled 2021-01-19 (×2): qty 1

## 2021-01-19 MED ORDER — ADULT MULTIVITAMIN W/MINERALS CH: 1.0000 | ORAL_TABLET | Freq: Every day | ORAL | 0 refills | Status: DC

## 2021-01-19 MED ORDER — FOLIC ACID 1 MG PO TABS: 1.0000 mg | ORAL_TABLET | Freq: Every day | ORAL | 1 refills | Status: DC

## 2021-01-19 MED ORDER — ONDANSETRON HCL 4 MG PO TABS: 4.0000 mg | ORAL_TABLET | Freq: Four times a day (QID) | ORAL | 0 refills | Status: DC | PRN

## 2021-01-19 MED ORDER — HYDROCODONE-ACETAMINOPHEN 5-325 MG PO TABS: 1.0000 | ORAL_TABLET | Freq: Three times a day (TID) | ORAL | 0 refills | Status: AC | PRN

## 2021-01-19 NOTE — Progress Notes (Signed)
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  Patient presents to the ED after witnessed seizure episode.   Assessment & Plan:   Principal Problem:   Seizure (HCC) Active Problems:   Alcohol abuse   Tobacco abuse   Aspiration pneumonia (HCC)   Alcoholic cardiomyopathy (HCC)   Chronic systolic CHF (congestive heart failure) (HCC)   AKI (acute kidney injury) (HCC)   Lactic acidosis   1-Seizure:  Witnessed seizure secondary to alcohol withdrawal Neurology following, no recommendation for antiepileptic medication Patient did receive Keppra loaded in the ED. MRI: No acute abnormalities. Old strokes.  No further episode. EEG; Normal Limits.   2-Post Ictal Encephalopathy:  Back to baseline resolved.  Right Rib pain:  Continue with Lidoderm patch. Incentive spirometry ordered.  Vicodin PRN.   Fall:  Secondary to weakness seizure.  CT head and CT spine no acute finding.  History of CVA;  Continue with aspirin, started on Crestor. LDL 1270. A1c 4.7 MRI; No acute intracranial abnormality. 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 CTA head neck: No acute large vessel occlusion.   Alcohol Dependence//alcohol withdrawal: Continue with CIWA protocol.  CIWA score at 6 Continue home Campral Received one dose of Ativan, this am. Plan to monitor overnight.   1 of 2 Blood culture positive for staph epi.  -Likely contaminate.  -Repeated  Blood culture no growth to date.   Lactic acidosis:  Likely secondary to seizure.  Resolved with IV fluids.  Alcoholic Cardiomyopathy:  Last ejection fraction  40%.  Repeated echo improve ejection fraction.  Continue Continue with Entresto and spironolactone.   Tobacco Dependence.  Counseled  Aspiration pneumonitis: Rule out. Anemia; probably of chronic diseases.  iron level normal.  Anemia of chronic diseases/  Hb stable.   Estimated body mass index is 28.19 kg/m as calculated from the following:   Height as of this encounter: 5\' 7"  (1.702 m).   Weight as of this encounter: 81.6 kg.   DVT prophylaxis: Lovenox Code Status: Full Code Family Communication: Care discussed with patient.  Disposition Plan:  Status is: Inpatient  Remains inpatient appropriate because: Patient admitted with witnessed seizure, encephalopathy, alcohol withdrawal.  Home 1/16   Consultants:  Neurology   Procedures:  ECHO: Ejection fraction 55 to 60%, left ventricle demonstrates regional wall motion abnormality, left ventricular diastolic function could not be evaluated.  Right ventricular systolic function is normal.  Mild mitral valve regurgitation.  Antimicrobials:    Subjective: He has mild tremors, feels anxious, report nausea.  Complaining of ribs pain  Objective: Vitals:   01/18/21 2116 01/19/21 0113 01/19/21 0512 01/19/21 0848  BP: (!) 129/93 (!) 136/94 102/73 (!) 118/95  Pulse: 77 71 79 81  Resp: 16 16 16 15   Temp: 98.5 F (36.9 C) 98.2 F (36.8 C) 97.8 F (36.6 C) (!) 97.5 F (36.4 C)  TempSrc: Oral Oral Oral Oral  SpO2: 97% 99% 99% 100%  Weight:      Height:        Intake/Output Summary (Last 24 hours) at 01/19/2021 1240 Last data filed at 01/19/2021 1007 Gross per 24 hour  Intake 840 ml  Output --  Net 840 ml    Filed Weights   01/15/21 1417  Weight: 81.6 kg    Examination:  General exam: NAD Respiratory system: CTA Cardiovascular system: S 1, S 2 RRR Gastrointestinal system: BS present, soft, nt Central nervous system: Alert, follows command.  Extremities: No edema   Data Reviewed: I have personally reviewed  following labs and imaging studies  CBC: Recent Labs  Lab 01/15/21 1407 01/16/21 0659 01/17/21 0450  WBC 8.1 7.1 4.8  HGB 13.0 10.7* 10.5*  HCT 40.3 32.5* 32.4*  MCV 95.7 93.9 95.3  PLT 229 180 192    Basic Metabolic Panel: Recent Labs  Lab 01/15/21 1407 01/16/21 0659 01/17/21 0450  NA 140 137 138  K 3.9 3.8 3.6  CL 105 111 107  CO2 19* 20* 22  GLUCOSE 125* 80 91  BUN 31* 25* 21*  CREATININE 1.88* 1.10 0.85  CALCIUM 9.1 8.1* 8.8*  MG 2.0  --   --     GFR: Estimated Creatinine Clearance: 109.9 mL/min (by C-G formula based on SCr of 0.85 mg/dL). Liver Function Tests: Recent Labs  Lab 01/15/21 1407 01/17/21 0450  AST 23 15  ALT 24 15  ALKPHOS 39 40  BILITOT 0.4 0.9  PROT 8.0 6.0*  ALBUMIN 4.1 3.3*    No results for input(s): LIPASE, AMYLASE in the last 168 hours. No results for input(s): AMMONIA in the last 168 hours. Coagulation Profile: No results for input(s): INR, PROTIME in the last 168 hours. Cardiac Enzymes: Recent Labs  Lab 01/15/21 1730  CKTOTAL 66    BNP (last 3 results) No results for input(s): PROBNP in the last 8760 hours. HbA1C: No results for input(s): HGBA1C in the last 72 hours.  CBG: Recent Labs  Lab 01/15/21 1422 01/16/21 1452  GLUCAP 125* 119*    Lipid Profile: No results for input(s): CHOL, HDL, LDLCALC, TRIG, CHOLHDL, LDLDIRECT in the last 72 hours.  Thyroid Function Tests: No results for input(s): TSH, T4TOTAL, FREET4, T3FREE, THYROIDAB in the last 72 hours. Anemia Panel: Recent Labs    01/19/21 0455  VITAMINB12 918*  FERRITIN 295  TIBC 312  IRON 58   Sepsis Labs: Recent Labs  Lab 01/15/21 1516 01/15/21 1730 01/15/21 2154  LATICACIDVEN 3.5* 2.4* 2.4*     Recent Results (from the past 240 hour(s))  Resp Panel by RT-PCR (Flu A&B, Covid) Nasopharyngeal Swab     Status: None   Collection Time: 01/15/21  3:16 PM   Specimen: Nasopharyngeal Swab; Nasopharyngeal(NP) swabs in vial transport medium  Result  Value Ref Range Status   SARS Coronavirus 2 by RT PCR NEGATIVE NEGATIVE Final    Comment: (NOTE) SARS-CoV-2 target nucleic acids are NOT DETECTED.  The SARS-CoV-2 RNA is generally detectable in upper respiratory specimens during the acute phase of infection. The lowest concentration of SARS-CoV-2 viral copies this assay can detect is 138 copies/mL. A negative result does not preclude SARS-Cov-2 infection and should not be used as the sole basis for treatment or other patient management decisions. A negative result may occur with  improper specimen collection/handling, submission of specimen other than nasopharyngeal swab, presence of viral mutation(s) within the areas targeted by this assay, and inadequate number of viral copies(<138 copies/mL). A negative result must be combined with clinical observations, patient history, and epidemiological information. The expected result is Negative.  Fact Sheet for Patients:  BloggerCourse.comhttps://www.fda.gov/media/152166/download  Fact Sheet for Healthcare Providers:  SeriousBroker.ithttps://www.fda.gov/media/152162/download  This test is no t yet approved or cleared by the [place] FDA and  has been authorized for detection and/or diagnosis of SARS-CoV-2  by FDA under an Emergency Use Authorization (EUA). This EUA will remain  in effect (meaning this test can be used) for the duration of the COVID-19 declaration under Section 564(b)(1) of the Act, 21 U.S.C.section 360bbb-3(b)(1), unless the authorization is terminated  or revoked sooner.       Influenza A by PCR NEGATIVE NEGATIVE Final   Influenza B by PCR NEGATIVE NEGATIVE Final    Comment: (NOTE) The Xpert Xpress SARS-CoV-2/FLU/RSV plus assay is intended as an aid in the diagnosis of influenza from Nasopharyngeal swab specimens and should not be used as a sole basis for treatment. Nasal washings and aspirates are unacceptable for Xpert Xpress SARS-CoV-2/FLU/RSV testing.  Fact Sheet for  Patients: BloggerCourse.com  Fact Sheet for Healthcare Providers: SeriousBroker.it  This test is not yet approved or cleared by the [place] FDA and has been authorized for detection and/or diagnosis of SARS-CoV-2 by FDA under an Emergency Use Authorization (EUA). This EUA will remain in effect (meaning this test can be used) for the duration of the COVID-19 declaration under Section 564(b)(1) of the Act, 21 U.S.C. section 360bbb-3(b)(1), unless the authorization is terminated or revoked.  Performed at [facility], [address] [place], [place] [address]   Blood culture (routine x 2)     Status: Abnormal   Collection Time: 01/15/21  3:16 PM   Specimen: BLOOD  Result Value Ref Range Status   Specimen Description   Final    BLOOD LEFT ANTECUBITAL Performed at [facility], [address] [place], [place] [address]    Special Requests   Final    BOTTLES DRAWN AEROBIC AND ANAEROBIC Blood Culture adequate volume Performed at [facility], [address] [place], [place] [address]    Culture  Setup Time   Final    GRAM POSITIVE COCCI AEROBIC BOTTLE ONLY CRITICAL RESULT CALLED TO, READ BACK BY AND VERIFIED WITH: SUSAN WATSON AT 1508 01/16/21.PMF    Culture (A)  Final    STAPHYLOCOCCUS EPIDERMIDIS THE SIGNIFICANCE OF ISOLATING THIS ORGANISM FROM A SINGLE SET OF BLOOD CULTURES WHEN MULTIPLE SETS ARE DRAWN IS UNCERTAIN. PLEASE NOTIFY THE MICROBIOLOGY DEPARTMENT WITHIN ONE WEEK IF SPECIATION AND SENSITIVITIES ARE REQUIRED. Performed at [facility], [address] [place], [place] [address]    Report Status 01/18/2021 FINAL  Final  Blood culture (routine x 2)     Status: None (Preliminary result)   Collection Time: 01/15/21  3:16 PM   Specimen: BLOOD  Result Value Ref Range Status   Specimen Description BLOOD RIGHT ANTECUBITAL  Final   Special Requests   Final    BOTTLES DRAWN AEROBIC  AND ANAEROBIC Blood Culture adequate volume   Culture   Final    NO GROWTH 4 DAYS Performed at [facility], [address], [place], [place] [address]    Report Status PENDING  Incomplete  Blood Culture ID Panel (Reflexed)     Status: Abnormal   Collection Time: 01/15/21  3:16 PM  Result Value Ref Range Status   Enterococcus faecalis NOT DETECTED NOT DETECTED Final   Enterococcus Faecium NOT DETECTED NOT DETECTED Final   Listeria monocytogenes NOT DETECTED NOT DETECTED Final   Staphylococcus species DETECTED (A) NOT DETECTED Final    Comment: CRITICAL RESULT CALLED TO, READ BACK BY AND VERIFIED WITH: SUSAN WATSON AT 1508 01/16/21.PMF    Staphylococcus aureus (BCID) NOT DETECTED NOT DETECTED Final   Staphylococcus epidermidis DETECTED (A) NOT DETECTED Final    Comment: CRITICAL  RESULT CALLED TO, READ BACK BY AND VERIFIED WITH: SUSAN WATSON AT 1508 01/16/21.PMF    Staphylococcus lugdunensis NOT DETECTED NOT DETECTED Final   Streptococcus species NOT DETECTED NOT DETECTED Final   Streptococcus agalactiae NOT DETECTED NOT DETECTED Final   Streptococcus pneumoniae NOT DETECTED NOT DETECTED Final   Streptococcus pyogenes NOT DETECTED NOT DETECTED Final   A.calcoaceticus-baumannii NOT DETECTED NOT DETECTED Final   Bacteroides fragilis NOT DETECTED NOT DETECTED Final   Enterobacterales NOT DETECTED NOT DETECTED Final   Enterobacter cloacae complex NOT DETECTED NOT DETECTED Final   Escherichia coli NOT DETECTED NOT DETECTED Final   Klebsiella aerogenes NOT DETECTED NOT DETECTED Final   Klebsiella oxytoca NOT DETECTED NOT DETECTED Final   Klebsiella pneumoniae NOT DETECTED NOT DETECTED Final   Proteus species NOT DETECTED NOT DETECTED Final   Salmonella species NOT DETECTED NOT DETECTED Final   Serratia marcescens NOT DETECTED NOT DETECTED Final   Haemophilus influenzae NOT DETECTED NOT DETECTED Final   Neisseria meningitidis NOT DETECTED NOT DETECTED Final   Pseudomonas  aeruginosa NOT DETECTED NOT DETECTED Final   Stenotrophomonas maltophilia NOT DETECTED NOT DETECTED Final   Candida albicans NOT DETECTED NOT DETECTED Final   Candida auris NOT DETECTED NOT DETECTED Final   Candida glabrata NOT DETECTED NOT DETECTED Final   Candida krusei NOT DETECTED NOT DETECTED Final   Candida parapsilosis NOT DETECTED NOT DETECTED Final   Candida tropicalis NOT DETECTED NOT DETECTED Final   Cryptococcus neoformans/gattii NOT DETECTED NOT DETECTED Final   Methicillin resistance mecA/C NOT DETECTED NOT DETECTED Final    Comment: Performed at [facility], [address] [place], [place] [address]  CULTURE, BLOOD (ROUTINE X 2) w Reflex to ID Panel     Status: None (Preliminary result)   Collection Time: 01/18/21  9:26 AM   Specimen: BLOOD  Result Value Ref Range Status   Specimen Description BLOOD BLOOD LEFT HAND  Final   Special Requests   Final    BOTTLES DRAWN AEROBIC AND ANAEROBIC Blood Culture adequate volume   Culture   Final    NO GROWTH < 24 HOURS Performed at [facility], [address] [place], [place] [address]    Report Status PENDING  Incomplete  CULTURE, BLOOD (ROUTINE X 2) w Reflex to ID Panel     Status: None (Preliminary result)   Collection Time: 01/18/21  9:28 AM   Specimen: BLOOD  Result Value Ref Range Status   Specimen Description BLOOD BLOOD RIGHT HAND  Final   Special Requests   Final    BOTTLES DRAWN AEROBIC AND ANAEROBIC Blood Culture adequate volume   Culture   Final    NO GROWTH < 24 HOURS Performed at [facility], [address] [place], [place] [address]    Report Status PENDING  Incomplete          Radiology Studies: No results found.      Scheduled Meds:  acamprosate  666 mg Oral TID   aspirin EC  81 mg Oral Daily   carvedilol  25 mg Oral BID WC   chlorhexidine  15 mL Mouth Rinse BID   enoxaparin (LOVENOX) injection  40 mg Subcutaneous Q24H   escitalopram  20 mg Oral Daily    folic acid  1 mg Oral Daily   lidocaine  1 patch Transdermal Q24H   multivitamin with minerals  1 tablet Oral Daily   nicotine  21 mg Transdermal Daily   rosuvastatin  10 mg Oral  QHS   sacubitril-valsartan  1 tablet Oral BID   spironolactone  50 mg Oral Daily   thiamine  100 mg Oral Daily   Continuous Infusions:   LOS: 4 days    Time spent: 35 minutes.     Alba Cory, MD [facility]   If 7PM-7AM, please contact night-coverage [email or web address]  01/19/2021, 12:40 PM

## 2021-01-19 NOTE — TOC Progression Note (Signed)
Transition of Care [facility]) - Progression Note    Patient Details  Name: Joshua Mayo MRN: [id number] Date of Birth: 1975/10/29  [facility] CM/SW Contact  Caryn Section, RN Phone Number: 01/19/2021, 12:48 PM  Clinical Narrative:   01/16 anticipated discharge tomorrow.         Expected Discharge Plan and Services           Expected Discharge Date: 01/19/21                                     Social Determinants of Health (SDOH) Interventions    Readmission Risk Interventions No flowsheet data found.

## 2021-01-20 LAB — CULTURE, BLOOD (ROUTINE X 2)
Culture: NO GROWTH
Special Requests: ADEQUATE

## 2021-01-20 NOTE — Discharge Summary (Addendum)
Physician Discharge Summary  Joshua Mayo QIO:[id number] DOB: [birth year] DOA: 01/15/2021  PCP: Tamsen Roers, PA-C (Inactive)  Admit date: 01/15/2021 Discharge date: 01/20/2021  Admitted From: Home  Disposition: Home   Recommendations for Outpatient Follow-up:  Follow up with PCP in 1-2 weeks Please obtain BMP/CBC in one week Continue to encourage alcohol cessation.  Follow up for HF  Home Health: None  Discharge Condition: Stable.  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  Patient presents to the ED after witnessed seizure episode.   1-Seizure:  Witnessed seizure secondary to alcohol withdrawal Neurology following, no recommendation for antiepileptic medication Patient did receive Keppra loaded in the ED. MRI: No acute abnormalities. Old strokes.  No further episode. EEG; Normal Limits.    2-Post Ictal Encephalopathy:  Back to baseline resolved.   Right Rib pain:  Continue with Lidoderm patch. Incentive spirometry ordered.  Vicodin PRN.    Fall:  Secondary to weakness seizure.  CT head and CT spine no acute finding.   History of CVA;  Continue with aspirin, started on Crestor. LDL 1270. A1c 4.7 MRI; No acute intracranial abnormality. 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 CTA head neck: No acute large vessel occlusion.    Alcohol Dependence//alcohol withdrawal: Continue with CIWA protocol.  CIWA score at 6 Continue home Campral Stable. Plan to discharge today. No evidence of withdrawal.    1 of 2 Blood culture positive for staph epi.  -Likely contaminate.  -Repeated  Blood culture no growth to date.    Lactic acidosis:   Likely secondary to seizure.  Resolved with IV fluids.   Alcoholic Cardiomyopathy:  Last ejection fraction 40%.  Repeated echo improve ejection fraction.  Continue Continue with Entresto and spironolactone.    Tobacco Dependence.  Counseled   Aspiration pneumonitis: Ruled out. Anemia; probably of chronic diseases.  iron level normal.  Anemia of chronic diseases/  Hb stable.    AKI; resolved. Cr peak 1.8.  Down to 0.8 Related to hemodynamics. Resolved.   Estimated body mass index is 28.19 kg/m as calculated from the following:   Height as of this encounter: 5\' 7"  (1.702 m).   Weight as of this encounter: 81.6 kg.    Discharge Diagnoses:  Principal Problem:   Seizure [facility]) Active Problems:   Alcohol abuse   Tobacco abuse   Aspiration pneumonia (HCC)   Alcoholic cardiomyopathy (HCC)   Chronic systolic CHF (congestive heart failure) (HCC)   AKI (acute kidney injury) (HCC)   Lactic acidosis    Discharge Instructions  Discharge Instructions     Diet - low sodium heart healthy   Complete by: As directed    Diet - low sodium heart healthy   Complete by: As directed    Increase activity slowly   Complete by: As directed    Increase activity slowly   Complete by: As directed       Allergies as of 01/20/2021       Reactions   Banana Anaphylaxis        Medication List     TAKE these medications    acamprosate 333 MG tablet Commonly known as: CAMPRAL Take 666 mg by mouth 3 (three) times daily.   aspirin EC 81 MG tablet Take 81 mg by mouth daily. Swallow whole.  carvedilol 25 MG tablet Commonly known as: COREG Take 1 tablet (25 mg total) by mouth 2 (two) times daily with a meal.   Entresto 24-26 MG Generic drug: sacubitril-valsartan Take 1 tablet by mouth 2 (two) times daily.   escitalopram 20 MG tablet Commonly known as: LEXAPRO Take 1 tablet (20 mg total) by mouth daily.   folic acid 1 MG tablet Commonly known as: FOLVITE Take 1 tablet (1 mg  total) by mouth daily.   HYDROcodone-acetaminophen 5-325 MG tablet Commonly known as: NORCO/VICODIN Take 1 tablet by mouth every 8 (eight) hours as needed for up to 3 days for severe pain or moderate pain.   multivitamin with minerals Tabs tablet Take 1 tablet by mouth daily.   nicotine 21 mg/24hr patch Commonly known as: NICODERM CQ - dosed in mg/24 hours Place 1 patch (21 mg total) onto the skin daily.   ondansetron 4 MG tablet Commonly known as: ZOFRAN Take 1 tablet (4 mg total) by mouth every 6 (six) hours as needed for nausea or vomiting.   rosuvastatin 10 MG tablet Commonly known as: CRESTOR Take 1 tablet (10 mg total) by mouth at bedtime.   spironolactone 25 MG tablet Commonly known as: ALDACTONE Take 50 mg by mouth daily.   tadalafil 5 MG tablet Commonly known as: CIALIS Take 5 mg by mouth daily.   thiamine 100 MG tablet Take 1 tablet (100 mg total) by mouth daily.        Follow-up Information     Chrismon, Jodell Cipro, PA-C. Go on 01/28/2021.   Specialty: Family Medicine Why: @1 :20pm with Dr.Rumball Contact information: [address] [place] [address] [phone]                Allergies  Allergen Reactions   Banana Anaphylaxis    Consultations: None   Procedures/Studies: CT ANGIO HEAD W OR WO CONTRAST  Result Date: 01/16/2021 CLINICAL DATA:  Stroke, follow up Neuro deficit, acute, stroke suspected; remotes stroke on MRI; EXAM: CT ANGIOGRAPHY HEAD AND NECK TECHNIQUE: Multidetector CT imaging of the head and neck was performed using the standard protocol during bolus administration of intravenous contrast. Multiplanar CT image reconstructions and MIPs were obtained to evaluate the vascular anatomy. Carotid stenosis measurements (when applicable) are obtained utilizing NASCET criteria, using the distal internal carotid diameter as the denominator. RADIATION DOSE REDUCTION: This exam was performed according to the departmental  dose-optimization program which includes automated exposure control, adjustment of the mA and/or kV according to patient size and/or use of iterative reconstruction technique. CONTRAST:  75mL OMNIPAQUE IOHEXOL 350 MG/ML SOLN COMPARISON:  Recent CT and MR imaging FINDINGS: CT HEAD Brain: There is no acute intracranial hemorrhage, mass effect, or edema. No new loss of gray-white differentiation. Small chronic infarct right occipital lobe. Small chronic superior cerebellar infarcts. No extra-axial collection. Ventricles and sulci are normal in size and configuration. Vascular: Better evaluated on CTA portion. Skull: Unremarkable. Sinuses/Orbits: No acute finding. Other: None. Review of the MIP images confirms the above findings CTA NECK Aortic arch: Great vessel origins are patent. Right carotid system: Patent. Minimal calcified plaque at the ICA origin without stenosis. Left carotid system: Patent. Minimal calcified plaque at the ICA origin without stenosis. Vertebral arteries: Patent and codominant.  No stenosis. Skeleton: No significant abnormality. Other neck: Unremarkable. Upper chest: No apical lung mass. Review of the MIP images confirms the above findings CTA HEAD Anterior circulation: Intracranial internal carotid arteries are patent. Anterior and middle cerebral arteries are patent. Anterior communicating  artery is present. Posterior circulation: Intracranial vertebral arteries are patent. Basilar artery is patent. Major cerebellar artery origins are patent. Bilateral posterior communicating arteries are present. There is essentially fetal origin of the patent posterior cerebral arteries with bilateral hypoplastic P1 segments. Venous sinuses: Patent as allowed by contrast bolus timing. Review of the MIP images confirms the above findings IMPRESSION: No acute intracranial abnormality. No large vessel occlusion, hemodynamically significant stenosis, or evidence of dissection. Electronically Signed   By: Guadlupe Spanish M.D.   On: 01/16/2021 14:17   DG Ribs Unilateral Right  Result Date: 01/16/2021 CLINICAL DATA:  Trauma, fall, pain EXAM: RIGHT RIBS - 2 VIEW COMPARISON:  Chest radiograph done on 01/15/2021 FINDINGS: No definite displaced fractures are seen. In 1 of the images, there is minimal cortical indentation in the lateral aspect of right ninth rib. There is no break in the cortical margins. There is a skin marker marking area of patient's symptoms which appears to be at the level of the right ninth rib. Visualized right lung is unremarkable. IMPRESSION: There is minimal cortical indentation without radiolucent line in the lateral aspect of right ninth rib. This may suggest recent or old undisplaced fracture. Electronically Signed   By: Ernie Avena M.D.   On: 01/16/2021 16:07   CT HEAD WO CONTRAST ( )  Result Date: 01/15/2021 CLINICAL DATA:  Trauma EXAM: CT HEAD WITHOUT CONTRAST CT CERVICAL SPINE WITHOUT CONTRAST TECHNIQUE: Multidetector CT imaging of the head and cervical spine was performed following the standard protocol without intravenous contrast. Multiplanar CT image reconstructions of the cervical spine were also generated. RADIATION DOSE REDUCTION: This exam was performed according to the departmental dose-optimization program which includes automated exposure control, adjustment of the mA and/or kV according to patient size and/or use of iterative reconstruction technique. COMPARISON:  None. FINDINGS: CT HEAD FINDINGS Brain: No evidence of acute infarction, hemorrhage, hydrocephalus, extra-axial collection or mass lesion/mass effect. Vascular: No hyperdense vessel or unexpected calcification. Skull: Normal. Negative for fracture or focal lesion. Sinuses/Orbits: No acute finding. Other: None. CT CERVICAL SPINE FINDINGS Alignment: Normal. Skull base and vertebrae: No acute fracture. No primary bone lesion or focal pathologic process. Soft tissues and spinal canal: No prevertebral fluid or  swelling. No visible canal hematoma. Disc levels:  No significant degenerative changes. Upper chest: Bilateral dependent atelectasis. Other: None. IMPRESSION: 1. No acute intracranial abnormality. 2. No CT evidence of acute cervical spine injury. Electronically Signed   By: Allegra Lai M.D.   On: 01/15/2021 15:19   CT ANGIO NECK W OR WO CONTRAST  Result Date: 01/16/2021 CLINICAL DATA:  Stroke, follow up Neuro deficit, acute, stroke suspected; remotes stroke on MRI; EXAM: CT ANGIOGRAPHY HEAD AND NECK TECHNIQUE: Multidetector CT imaging of the head and neck was performed using the standard protocol during bolus administration of intravenous contrast. Multiplanar CT image reconstructions and MIPs were obtained to evaluate the vascular anatomy. Carotid stenosis measurements (when applicable) are obtained utilizing NASCET criteria, using the distal internal carotid diameter as the denominator. RADIATION DOSE REDUCTION: This exam was performed according to the departmental dose-optimization program which includes automated exposure control, adjustment of the mA and/or kV according to patient size and/or use of iterative reconstruction technique. CONTRAST:  75mL OMNIPAQUE IOHEXOL 350 MG/ML SOLN COMPARISON:  Recent CT and MR imaging FINDINGS: CT HEAD Brain: There is no acute intracranial hemorrhage, mass effect, or edema. No new loss of gray-white differentiation. Small chronic infarct right occipital lobe. Small chronic superior cerebellar infarcts. No extra-axial collection. Ventricles  and sulci are normal in size and configuration. Vascular: Better evaluated on CTA portion. Skull: Unremarkable. Sinuses/Orbits: No acute finding. Other: None. Review of the MIP images confirms the above findings CTA NECK Aortic arch: Great vessel origins are patent. Right carotid system: Patent. Minimal calcified plaque at the ICA origin without stenosis. Left carotid system: Patent. Minimal calcified plaque at the ICA origin  without stenosis. Vertebral arteries: Patent and codominant.  No stenosis. Skeleton: No significant abnormality. Other neck: Unremarkable. Upper chest: No apical lung mass. Review of the MIP images confirms the above findings CTA HEAD Anterior circulation: Intracranial internal carotid arteries are patent. Anterior and middle cerebral arteries are patent. Anterior communicating artery is present. Posterior circulation: Intracranial vertebral arteries are patent. Basilar artery is patent. Major cerebellar artery origins are patent. Bilateral posterior communicating arteries are present. There is essentially fetal origin of the patent posterior cerebral arteries with bilateral hypoplastic P1 segments. Venous sinuses: Patent as allowed by contrast bolus timing. Review of the MIP images confirms the above findings IMPRESSION: No acute intracranial abnormality. No large vessel occlusion, hemodynamically significant stenosis, or evidence of dissection. Electronically Signed   By: Guadlupe Spanish M.D.   On: 01/16/2021 14:17   CT Cervical Spine Wo Contrast  Result Date: 01/15/2021 CLINICAL DATA:  Trauma EXAM: CT HEAD WITHOUT CONTRAST CT CERVICAL SPINE WITHOUT CONTRAST TECHNIQUE: Multidetector CT imaging of the head and cervical spine was performed following the standard protocol without intravenous contrast. Multiplanar CT image reconstructions of the cervical spine were also generated. RADIATION DOSE REDUCTION: This exam was performed according to the departmental dose-optimization program which includes automated exposure control, adjustment of the mA and/or kV according to patient size and/or use of iterative reconstruction technique. COMPARISON:  None. FINDINGS: CT HEAD FINDINGS Brain: No evidence of acute infarction, hemorrhage, hydrocephalus, extra-axial collection or mass lesion/mass effect. Vascular: No hyperdense vessel or unexpected calcification. Skull: Normal. Negative for fracture or focal lesion.  Sinuses/Orbits: No acute finding. Other: None. CT CERVICAL SPINE FINDINGS Alignment: Normal. Skull base and vertebrae: No acute fracture. No primary bone lesion or focal pathologic process. Soft tissues and spinal canal: No prevertebral fluid or swelling. No visible canal hematoma. Disc levels:  No significant degenerative changes. Upper chest: Bilateral dependent atelectasis. Other: None. IMPRESSION: 1. No acute intracranial abnormality. 2. No CT evidence of acute cervical spine injury. Electronically Signed   By: Allegra Lai M.D.   On: 01/15/2021 15:19   MR BRAIN WO CONTRAST  Result Date: 01/16/2021 CLINICAL DATA:  Initial evaluation for acute seizure. EXAM: MRI HEAD WITHOUT CONTRAST TECHNIQUE: Multiplanar, multiecho pulse sequences of the brain and surrounding structures were obtained without intravenous contrast. COMPARISON:  Prior head CT from earlier the same day. FINDINGS: Brain: Examination moderately to severely degraded by motion artifact. Cerebral volume grossly within normal limits. Suspected at least mild chronic microvascular ischemic disease noted involving the periventricular white matter. Small remote cortical infarct present at the right parieto-occipital region. Additional small remote bilateral cerebellar infarcts, left larger than right. No visible foci of restricted diffusion to suggest acute or subacute ischemia or changes related to acute seizure. Gray-white matter differentiation otherwise maintained. No visible foci of susceptibility artifact to suggest acute or chronic intracranial hemorrhage. No mass lesion, midline shift or mass effect. Ventricles normal size without hydrocephalus. No extra-axial fluid collection. Pituitary gland suprasellar region within normal limits. Midline structures intact and normal. No visible intrinsic temporal lobe abnormality. Vascular: Major intracranial vascular flow voids are grossly maintained at the skull base. Diminutive  vertebrobasilar system  noted. Skull and upper cervical spine: Craniocervical junction within normal limits. Bone marrow signal intensity normal. No scalp soft tissue abnormality. Sinuses/Orbits: Globes and orbital soft tissues grossly within normal limits. Paranasal sinuses are largely clear. No significant mastoid effusion. Other: None. IMPRESSION: 1. Motion degraded exam. 2. No acute intracranial abnormality. 3. 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 Electronically Signed   By: Rise Mu M.D.   On: 01/16/2021 04:26   US RENAL  Result Date: 01/15/2021 CLINICAL DATA:  Acute kidney injury EXAM: RENAL / URINARY TRACT ULTRASOUND COMPLETE COMPARISON:  No prior renal ultrasound. FINDINGS: Right Kidney: Renal measurements: 9.9 x 4.8 x 5.5 cm = volume: 137 mL. Echogenicity within normal limits. No mass or hydronephrosis visualized. Left Kidney: Renal measurements: 12.0 x 5.4 x 5.3 cm = volume: 177 mL. Echogenicity within normal limits. No mass or hydronephrosis visualized. Bladder: Appears normal for degree of bladder distention. Other: None. IMPRESSION: No acute finding in the kidneys and bladder. No etiology seen for the patient's AKI. Electronically Signed   By: Wiliam Ke M.D.   On: 01/15/2021 17:58   DG Chest Portable 1 View  Result Date: 01/15/2021 CLINICAL DATA:  Altered mental status, seizures EXAM: PORTABLE CHEST 1 VIEW COMPARISON:  03/19/2020 FINDINGS: Transverse diameter of heart is increased. Central pulmonary vessels are prominent. There is poor inspiration. Interstitial markings in the parahilar regions are prominent. Linear patchy infiltrate is seen in the right lower lung fields. There is no pleural effusion or pneumothorax. IMPRESSION: New patchy infiltrate is seen in the right lower lung fields suggesting atelectasis/pneumonia. There is slight prominence of interstitial markings in the  parahilar regions which may be due to poor inspiration. There is no pleural effusion or pneumothorax. Electronically Signed   By: Ernie Avena M.D.   On: 01/15/2021 14:34   EEG adult  Result Date: 01/16/2021 Charlsie Quest, MD     01/16/2021  5:35 PM Patient Name: EDGEL DEGNAN MRN: [id number] Epilepsy Attending: Charlsie Quest Referring Physician/Provider: Verdia Kuba, DO Date: 01/16/2021 Duration: 29.44 mins Patient history: 46 y.o. male PMHx alcohol abuse, tobacco abuse, alcoholic cardiomyopathy, acute respiratory failure with hypoxia 05/15/2018 admitted for seizure related to binge drinking a very large quantity of EtOH the previous night. EEG to evaluate for seizure. Level of alertness: Awake AEDs during EEG study: Ativan Technical aspects: This EEG study was done with scalp electrodes positioned according to the 10-20 International system of electrode placement. Electrical activity was acquired at a sampling rate of 500Hz  and reviewed with a high frequency filter of 70Hz  and a low frequency filter of 1Hz . EEG data were recorded continuously and digitally stored. Description: No posterior dominant rhythm was seen. There is an excessive amount of 15 to 18 Hz beta activity distributed symmetrically and diffusely. Physiologic photic driving was not seen during photic stimulation. Hyperventilation was not performed.   ABNORMALITY - Excessive beta, generalized IMPRESSION: This study is within normal limits. The excessive beta activity seen in the background is most likely due to the effect of benzodiazepine and is a benign EEG pattern. No seizures or epileptiform discharges were seen throughout the recording.   ECHOCARDIOGRAM COMPLETE  Result Date: 01/16/2021    ECHOCARDIOGRAM REPORT   Patient Name:   TIEN SPOONER Date of Exam: 01/16/2021 Medical Rec #:  01/18/2021        Height:  67.0 in Accession #:    [id number]       Weight:       180.0 lb Date of Birth:  August 14, 1975         BSA:          1.934 m Patient Age:    45 years         BP:           109/79 mmHg Patient Gender: M                HR:           105 bpm. Exam Location:  [facility] Procedure: 2D Echo, Color Doppler and Cardiac Doppler Indications:     Stroke I63.9  History:         Patient has prior history of Echocardiogram examinations, most                  recent 03/20/2020. CHF; Risk Factors:Hypertension.  Sonographer:     Cristela Blue Referring Phys:  [id number] HUNTER J COLLINS Diagnosing Phys: Sena Slate IMPRESSIONS  1. Left ventricular ejection fraction, by estimation, is 55 to 60%. The left ventricle has normal function. The left ventricle demonstrates regional wall motion abnormalities (see scoring diagram/findings for description). The left ventricular internal cavity size was mildly dilated. Left ventricular diastolic function could not be evaluated.  2. Right ventricular systolic function is normal. The right ventricular size is normal.  3. The mitral valve is normal in structure. Mild mitral valve regurgitation. No evidence of mitral stenosis.  4. The aortic valve is normal in structure. Aortic valve regurgitation is not visualized. No aortic stenosis is present.  5. The inferior vena cava is normal in size with greater than 50% respiratory variability, suggesting right atrial pressure of 3 mmHg. FINDINGS  Left Ventricle: Left ventricular ejection fraction, by estimation, is 55 to 60%. The left ventricle has normal function. The left ventricle demonstrates regional wall motion abnormalities. Mild hypokinesis of the left ventricular, basal-mid inferior wall. The left ventricular internal cavity size was mildly dilated. There is no left ventricular hypertrophy. Left ventricular diastolic function could not be evaluated.  LV Wall Scoring: The posterior wall and basal inferior segment are hypokinetic. Right Ventricle: The right ventricular size is normal. No increase in right ventricular wall thickness. Right ventricular  systolic function is normal. Left Atrium: Left atrial size was normal in size. Right Atrium: Right atrial size was normal in size. Pericardium: There is no evidence of pericardial effusion. Mitral Valve: The mitral valve is normal in structure. Mild mitral valve regurgitation. No evidence of mitral valve stenosis. MV peak gradient, 3.9 mmHg. The mean mitral valve gradient is 2.0 mmHg. Tricuspid Valve: The tricuspid valve is normal in structure. Tricuspid valve regurgitation is mild. Aortic Valve: The aortic valve is normal in structure. Aortic valve regurgitation is not visualized. No aortic stenosis is present. Aortic valve mean gradient measures 3.0 mmHg. Aortic valve peak gradient measures 6.6 mmHg. Aortic valve area, by VTI measures 2.53 cm. Pulmonic Valve: The pulmonic valve was not well visualized. Pulmonic valve regurgitation is not visualized. Aorta: The aortic root and ascending aorta are structurally normal, with no evidence of dilitation. Venous: The inferior vena cava is normal in size with greater than 50% respiratory variability, suggesting right atrial pressure of 3 mmHg. IAS/Shunts: No atrial level shunt detected by color flow Doppler.  LEFT VENTRICLE PLAX 2D LVOT diam:     2.20 cm LV SV:  49 LV SV Index:   26 LVOT Area:     3.80 cm  RIGHT VENTRICLE RV S prime:     18.60 cm/s TAPSE (M-mode): 2.2 cm LEFT ATRIUM             Index LA Vol (A2C):   37.0 ml 19.13 ml/m LA Vol (A4C):   37.4 ml 19.34 ml/m LA Biplane Vol: 37.9 ml 19.60 ml/m  AORTIC VALVE                    PULMONIC VALVE AV Area (Vmax):    2.15 cm     PV Vmax:        0.77 m/s AV Area (Vmean):   2.19 cm     PV Vmean:       48.700 cm/s AV Area (VTI):     2.53 cm     PV VTI:         0.136 m AV Vmax:           128.00 cm/s  PV Peak grad:   2.4 mmHg AV Vmean:          84.200 cm/s  PV Mean grad:   1.0 mmHg AV VTI:            0.195 m      RVOT Peak grad: 3 mmHg AV Peak Grad:      6.6 mmHg AV Mean Grad:      3.0 mmHg LVOT Vmax:          72.50 cm/s LVOT Vmean:        48.500 cm/s LVOT VTI:          0.130 m LVOT/AV VTI ratio: 0.67 MITRAL VALVE               TRICUSPID VALVE MV Area (PHT): 8.16 cm    TR Peak grad:   12.8 mmHg MV Area VTI:   3.17 cm    TR Vmax:        179.00 cm/s MV Peak grad:  3.9 mmHg MV Mean grad:  2.0 mmHg    SHUNTS MV Vmax:       0.99 m/s    Systemic VTI:  0.13 m MV Vmean:      67.4 cm/s   Systemic Diam: 2.20 cm MV Decel Time: 93 msec     Pulmonic VTI:  0.175 m MV E velocity: 69.40 cm/s MV A velocity: 88.70 cm/s MV E/A ratio:  0.78 Sena Slate Electronically signed by Sena Slate Signature Date/Time: 01/16/2021/4:14:58 PM    Final      Subjective: He continue to have pain right side ribs.   Discharge Exam: Vitals:   01/20/21 0749 01/20/21 1111  BP: (!) 103/92 112/87  Pulse: 74 72  Resp: 18 17  Temp: (!) 97.5 F (36.4 C) 98 F (36.7 C)  SpO2: 99% 99%     General: Pt is alert, awake, not in acute distress Cardiovascular: RRR, S1/S2 +, no rubs, no gallops Respiratory: CTA bilaterally, no wheezing, no rhonchi Abdominal: Soft, NT, ND, bowel sounds + Extremities: no edema, no cyanosis    The results of significant diagnostics from this hospitalization (including imaging, microbiology, ancillary and laboratory) are listed below for reference.     Microbiology: Recent Results (from the past 240 hour(s))  Resp Panel by RT-PCR (Flu A&B, Covid) Nasopharyngeal Swab     Status: None   Collection Time: 01/15/21  3:16 PM   Specimen: Nasopharyngeal Swab; Nasopharyngeal(NP) swabs in vial  transport medium  Result Value Ref Range Status   SARS Coronavirus 2 by RT PCR NEGATIVE NEGATIVE Final    Comment: (NOTE) SARS-CoV-2 target nucleic acids are NOT DETECTED.  The SARS-CoV-2 RNA is generally detectable in upper respiratory specimens during the acute phase of infection. The lowest concentration of SARS-CoV-2 viral copies this assay can detect is 138 copies/mL. A negative result does not preclude  SARS-Cov-2 infection and should not be used as the sole basis for treatment or other patient management decisions. A negative result may occur with  improper specimen collection/handling, submission of specimen other than nasopharyngeal swab, presence of viral mutation(s) within the areas targeted by this assay, and inadequate number of viral copies(<138 copies/mL). A negative result must be combined with clinical observations, patient history, and epidemiological information. The expected result is Negative.  Fact Sheet for Patients:  BloggerCourse.comhttps://www.fda.gov/media/152166/download  Fact Sheet for Healthcare Providers:  SeriousBroker.ithttps://www.fda.gov/media/152162/download  This test is no t yet approved or cleared by the [place] FDA and  has been authorized for detection and/or diagnosis of SARS-CoV-2 by FDA under an Emergency Use Authorization (EUA). This EUA will remain  in effect (meaning this test can be used) for the duration of the COVID-19 declaration under Section 564(b)(1) of the Act, 21 U.S.C.section 360bbb-3(b)(1), unless the authorization is terminated  or revoked sooner.       Influenza A by PCR NEGATIVE NEGATIVE Final   Influenza B by PCR NEGATIVE NEGATIVE Final    Comment: (NOTE) The Xpert Xpress SARS-CoV-2/FLU/RSV plus assay is intended as an aid in the diagnosis of influenza from Nasopharyngeal swab specimens and should not be used as a sole basis for treatment. Nasal washings and aspirates are unacceptable for Xpert Xpress SARS-CoV-2/FLU/RSV testing.  Fact Sheet for Patients: BloggerCourse.comhttps://www.fda.gov/media/152166/download  Fact Sheet for Healthcare Providers: SeriousBroker.ithttps://www.fda.gov/media/152162/download  This test is not yet approved or cleared by the [place] FDA and has been authorized for detection and/or diagnosis of SARS-CoV-2 by FDA under an Emergency Use Authorization (EUA). This EUA will remain in effect (meaning this test can be used) for the duration of  the COVID-19 declaration under Section 564(b)(1) of the Act, 21 U.S.C. section 360bbb-3(b)(1), unless the authorization is terminated or revoked.  Performed at [facility], [address] [place], [place] [address]   Blood culture (routine x 2)     Status: Abnormal   Collection Time: 01/15/21  3:16 PM   Specimen: BLOOD  Result Value Ref Range Status   Specimen Description   Final    BLOOD LEFT ANTECUBITAL Performed at [facility], [address] [place], [place] [address]    Special Requests   Final    BOTTLES DRAWN AEROBIC AND ANAEROBIC Blood Culture adequate volume Performed at [facility], [address] [place], [place] [address]    Culture  Setup Time   Final    GRAM POSITIVE COCCI AEROBIC BOTTLE ONLY CRITICAL RESULT CALLED TO, READ BACK BY AND VERIFIED WITH: SUSAN WATSON AT 1508 01/16/21.PMF    Culture (A)  Final    STAPHYLOCOCCUS EPIDERMIDIS THE SIGNIFICANCE OF ISOLATING THIS ORGANISM FROM A SINGLE SET OF BLOOD CULTURES WHEN MULTIPLE SETS ARE DRAWN IS UNCERTAIN. PLEASE NOTIFY THE MICROBIOLOGY DEPARTMENT WITHIN ONE WEEK IF SPECIATION AND SENSITIVITIES ARE REQUIRED. Performed at [facility], [address] [place], [place] [address]    Report Status 01/18/2021 FINAL  Final  Blood culture (routine x 2)     Status: None   Collection Time: 01/15/21  3:16 PM   Specimen: BLOOD  Result Value Ref Range Status   Specimen Description BLOOD RIGHT ANTECUBITAL  Final   Special Requests   Final    BOTTLES DRAWN AEROBIC AND ANAEROBIC Blood Culture adequate volume   Culture   Final    NO GROWTH 5 DAYS Performed at [facility], [address] [place], [place] [address]    Report Status 01/20/2021 FINAL  Final  Blood Culture ID Panel (Reflexed)     Status: Abnormal   Collection Time: 01/15/21  3:16 PM  Result Value Ref Range Status   Enterococcus faecalis NOT DETECTED NOT DETECTED Final   Enterococcus Faecium NOT DETECTED NOT  DETECTED Final   Listeria monocytogenes NOT DETECTED NOT DETECTED Final   Staphylococcus species DETECTED (A) NOT DETECTED Final    Comment: CRITICAL RESULT CALLED TO, READ BACK BY AND VERIFIED WITH: SUSAN WATSON AT 1508 01/16/21.PMF    Staphylococcus aureus (BCID) NOT DETECTED NOT DETECTED Final   Staphylococcus epidermidis DETECTED (A) NOT DETECTED Final    Comment: CRITICAL RESULT CALLED TO, READ BACK BY AND VERIFIED WITH: SUSAN WATSON AT 1508 01/16/21.PMF    Staphylococcus lugdunensis NOT DETECTED NOT DETECTED Final   Streptococcus species NOT DETECTED NOT DETECTED Final   Streptococcus agalactiae NOT DETECTED NOT DETECTED Final   Streptococcus pneumoniae NOT DETECTED NOT DETECTED Final   Streptococcus pyogenes NOT DETECTED NOT DETECTED Final   A.calcoaceticus-baumannii NOT DETECTED NOT DETECTED Final   Bacteroides fragilis NOT DETECTED NOT DETECTED Final   Enterobacterales NOT DETECTED NOT DETECTED Final   Enterobacter cloacae complex NOT DETECTED NOT DETECTED Final   Escherichia coli NOT DETECTED NOT DETECTED Final   Klebsiella aerogenes NOT DETECTED NOT DETECTED Final   Klebsiella oxytoca NOT DETECTED NOT DETECTED Final   Klebsiella pneumoniae NOT DETECTED NOT DETECTED Final   Proteus species NOT DETECTED NOT DETECTED Final   Salmonella species NOT DETECTED NOT DETECTED Final   Serratia marcescens NOT DETECTED NOT DETECTED Final   Haemophilus influenzae NOT DETECTED NOT DETECTED Final   Neisseria meningitidis NOT DETECTED NOT DETECTED Final   Pseudomonas aeruginosa NOT DETECTED NOT DETECTED Final   Stenotrophomonas maltophilia NOT DETECTED NOT DETECTED Final   Candida albicans NOT DETECTED NOT DETECTED Final   Candida auris NOT DETECTED NOT DETECTED Final   Candida glabrata NOT DETECTED NOT DETECTED Final   Candida krusei NOT DETECTED NOT DETECTED Final   Candida parapsilosis NOT DETECTED NOT DETECTED Final   Candida tropicalis NOT DETECTED NOT DETECTED Final   Cryptococcus  neoformans/gattii NOT DETECTED NOT DETECTED Final   Methicillin resistance mecA/C NOT DETECTED NOT DETECTED Final    Comment: Performed at [facility], [address] [place], [place] [address]  CULTURE, BLOOD (ROUTINE X 2) w Reflex to ID Panel     Status: None (Preliminary result)   Collection Time: 01/18/21  9:26 AM   Specimen: BLOOD  Result Value Ref Range Status   Specimen Description BLOOD BLOOD LEFT HAND  Final   Special Requests   Final    BOTTLES DRAWN AEROBIC AND ANAEROBIC Blood Culture adequate volume   Culture   Final    NO GROWTH 2 DAYS Performed at [facility], [address] [place], [place] [address]    Report Status PENDING  Incomplete  CULTURE, BLOOD (ROUTINE X 2) w Reflex to ID Panel     Status: None (Preliminary result)   Collection Time: 01/18/21  9:28 AM   Specimen: BLOOD  Result Value Ref Range  Status   Specimen Description BLOOD BLOOD RIGHT HAND  Final   Special Requests   Final    BOTTLES DRAWN AEROBIC AND ANAEROBIC Blood Culture adequate volume   Culture   Final    NO GROWTH 2 DAYS Performed at [facility], [address] [place], [place] [address]    Report Status PENDING  Incomplete     Labs: BNP (last 3 results) Recent Labs    03/19/20 2108  BNP 22.4   Basic Metabolic Panel: Recent Labs  Lab 01/15/21 1407 01/16/21 0659 01/17/21 0450  NA 140 137 138  K 3.9 3.8 3.6  CL 105 111 107  CO2 19* 20* 22  GLUCOSE 125* 80 91  BUN 31* 25* 21*  CREATININE 1.88* 1.10 0.85  CALCIUM 9.1 8.1* 8.8*  MG 2.0  --   --    Liver Function Tests: Recent Labs  Lab 01/15/21 1407 01/17/21 0450  AST 23 15  ALT 24 15  ALKPHOS 39 40  BILITOT 0.4 0.9  PROT 8.0 6.0*  ALBUMIN 4.1 3.3*   No results for input(s): LIPASE, AMYLASE in the last 168 hours. No results for input(s): AMMONIA in the last 168 hours. CBC: Recent Labs  Lab 01/15/21 1407 01/16/21 0659 01/17/21 0450  WBC 8.1 7.1 4.8  HGB 13.0 10.7* 10.5*  HCT  40.3 32.5* 32.4*  MCV 95.7 93.9 95.3  PLT 229 180 192   Cardiac Enzymes: Recent Labs  Lab 01/15/21 1730  CKTOTAL 66   BNP: Invalid input(s): POCBNP CBG: Recent Labs  Lab 01/15/21 1422 01/16/21 1452  GLUCAP 125* 119*   D-Dimer No results for input(s): DDIMER in the last 72 hours. Hgb A1c No results for input(s): HGBA1C in the last 72 hours. Lipid Profile No results for input(s): CHOL, HDL, LDLCALC, TRIG, CHOLHDL, LDLDIRECT in the last 72 hours. Thyroid function studies No results for input(s): TSH, T4TOTAL, T3FREE, THYROIDAB in the last 72 hours.  Invalid input(s): FREET3 Anemia work up Recent Labs    01/19/21 0455  VITAMINB12 918*  FERRITIN 295  TIBC 312  IRON 58   Urinalysis    Component Value Date/Time   COLORURINE YELLOW (A) 03/20/2020 0022   APPEARANCEUR CLEAR (A) 03/20/2020 0022   LABSPEC 1.042 (H) 03/20/2020 0022   PHURINE 6.0 03/20/2020 0022   GLUCOSEU NEGATIVE 03/20/2020 0022   HGBUR NEGATIVE 03/20/2020 0022   BILIRUBINUR NEGATIVE 03/20/2020 0022   BILIRUBINUR neg 11/01/2017 1349   KETONESUR 80 (A) 03/20/2020 0022   PROTEINUR NEGATIVE 03/20/2020 0022   UROBILINOGEN 0.2 11/01/2017 1349   NITRITE NEGATIVE 03/20/2020 0022   LEUKOCYTESUR NEGATIVE 03/20/2020 0022   Sepsis Labs Invalid input(s): PROCALCITONIN,  WBC,  LACTICIDVEN Microbiology Recent Results (from the past 240 hour(s))  Resp Panel by RT-PCR (Flu A&B, Covid) Nasopharyngeal Swab     Status: None   Collection Time: 01/15/21  3:16 PM   Specimen: Nasopharyngeal Swab; Nasopharyngeal(NP) swabs in vial transport medium  Result Value Ref Range Status   SARS Coronavirus 2 by RT PCR NEGATIVE NEGATIVE Final    Comment: (NOTE) SARS-CoV-2 target nucleic acids are NOT DETECTED.  The SARS-CoV-2 RNA is generally detectable in upper respiratory specimens during the acute phase of infection. The lowest concentration of SARS-CoV-2 viral copies this assay can detect is 138 copies/mL. A negative result  does not preclude SARS-Cov-2 infection and should not be used as the sole basis for treatment or other patient management decisions. A negative result may occur with  improper specimen collection/handling, submission  of specimen other than nasopharyngeal swab, presence of viral mutation(s) within the areas targeted by this assay, and inadequate number of viral copies(<138 copies/mL). A negative result must be combined with clinical observations, patient history, and epidemiological information. The expected result is Negative.  Fact Sheet for Patients:  BloggerCourse.com  Fact Sheet for Healthcare Providers:  SeriousBroker.it  This test is no t yet approved or cleared by the [place] FDA and  has been authorized for detection and/or diagnosis of SARS-CoV-2 by FDA under an Emergency Use Authorization (EUA). This EUA will remain  in effect (meaning this test can be used) for the duration of the COVID-19 declaration under Section 564(b)(1) of the Act, 21 U.S.C.section 360bbb-3(b)(1), unless the authorization is terminated  or revoked sooner.       Influenza A by PCR NEGATIVE NEGATIVE Final   Influenza B by PCR NEGATIVE NEGATIVE Final    Comment: (NOTE) The Xpert Xpress SARS-CoV-2/FLU/RSV plus assay is intended as an aid in the diagnosis of influenza from Nasopharyngeal swab specimens and should not be used as a sole basis for treatment. Nasal washings and aspirates are unacceptable for Xpert Xpress SARS-CoV-2/FLU/RSV testing.  Fact Sheet for Patients: BloggerCourse.com  Fact Sheet for Healthcare Providers: SeriousBroker.it  This test is not yet approved or cleared by the [place] FDA and has been authorized for detection and/or diagnosis of SARS-CoV-2 by FDA under an Emergency Use Authorization (EUA). This EUA will remain in effect (meaning this test can be used) for  the duration of the COVID-19 declaration under Section 564(b)(1) of the Act, 21 U.S.C. section 360bbb-3(b)(1), unless the authorization is terminated or revoked.  Performed at [facility], [address] [place], [place] [address]   Blood culture (routine x 2)     Status: Abnormal   Collection Time: 01/15/21  3:16 PM   Specimen: BLOOD  Result Value Ref Range Status   Specimen Description   Final    BLOOD LEFT ANTECUBITAL Performed at [facility], [address], [place], [place] [address]    Special Requests   Final    BOTTLES DRAWN AEROBIC AND ANAEROBIC Blood Culture adequate volume Performed at [facility], [address] [place], [place] [address]    Culture  Setup Time   Final    GRAM POSITIVE COCCI AEROBIC BOTTLE ONLY CRITICAL RESULT CALLED TO, READ BACK BY AND VERIFIED WITH: SUSAN WATSON AT 1508 01/16/21.PMF    Culture (A)  Final    STAPHYLOCOCCUS EPIDERMIDIS THE SIGNIFICANCE OF ISOLATING THIS ORGANISM FROM A SINGLE SET OF BLOOD CULTURES WHEN MULTIPLE SETS ARE DRAWN IS UNCERTAIN. PLEASE NOTIFY THE MICROBIOLOGY DEPARTMENT WITHIN ONE WEEK IF SPECIATION AND SENSITIVITIES ARE REQUIRED. Performed at [facility], [address], [place], [place] [address]    Report Status 01/18/2021 FINAL  Final  Blood culture (routine x 2)     Status: None   Collection Time: 01/15/21  3:16 PM   Specimen: BLOOD  Result Value Ref Range Status   Specimen Description BLOOD RIGHT ANTECUBITAL  Final   Special Requests   Final    BOTTLES DRAWN AEROBIC AND ANAEROBIC Blood Culture adequate volume   Culture   Final    NO GROWTH 5 DAYS Performed at [facility], [address] [place], [place] [address]    Report Status 01/20/2021 FINAL  Final  Blood Culture ID Panel (Reflexed)     Status: Abnormal   Collection Time: 01/15/21  3:16 PM  Result Value Ref  Range Status   Enterococcus faecalis NOT DETECTED NOT DETECTED Final   Enterococcus Faecium  NOT DETECTED NOT DETECTED Final   Listeria monocytogenes NOT DETECTED NOT DETECTED Final   Staphylococcus species DETECTED (A) NOT DETECTED Final    Comment: CRITICAL RESULT CALLED TO, READ BACK BY AND VERIFIED WITH: SUSAN WATSON AT 1508 01/16/21.PMF    Staphylococcus aureus (BCID) NOT DETECTED NOT DETECTED Final   Staphylococcus epidermidis DETECTED (A) NOT DETECTED Final    Comment: CRITICAL RESULT CALLED TO, READ BACK BY AND VERIFIED WITH: SUSAN WATSON AT 1508 01/16/21.PMF    Staphylococcus lugdunensis NOT DETECTED NOT DETECTED Final   Streptococcus species NOT DETECTED NOT DETECTED Final   Streptococcus agalactiae NOT DETECTED NOT DETECTED Final   Streptococcus pneumoniae NOT DETECTED NOT DETECTED Final   Streptococcus pyogenes NOT DETECTED NOT DETECTED Final   A.calcoaceticus-baumannii NOT DETECTED NOT DETECTED Final   Bacteroides fragilis NOT DETECTED NOT DETECTED Final   Enterobacterales NOT DETECTED NOT DETECTED Final   Enterobacter cloacae complex NOT DETECTED NOT DETECTED Final   Escherichia coli NOT DETECTED NOT DETECTED Final   Klebsiella aerogenes NOT DETECTED NOT DETECTED Final   Klebsiella oxytoca NOT DETECTED NOT DETECTED Final   Klebsiella pneumoniae NOT DETECTED NOT DETECTED Final   Proteus species NOT DETECTED NOT DETECTED Final   Salmonella species NOT DETECTED NOT DETECTED Final   Serratia marcescens NOT DETECTED NOT DETECTED Final   Haemophilus influenzae NOT DETECTED NOT DETECTED Final   Neisseria meningitidis NOT DETECTED NOT DETECTED Final   Pseudomonas aeruginosa NOT DETECTED NOT DETECTED Final   Stenotrophomonas maltophilia NOT DETECTED NOT DETECTED Final   Candida albicans NOT DETECTED NOT DETECTED Final   Candida auris NOT DETECTED NOT DETECTED Final   Candida glabrata NOT DETECTED NOT DETECTED Final   Candida krusei NOT DETECTED NOT DETECTED Final   Candida parapsilosis NOT DETECTED NOT DETECTED Final   Candida tropicalis NOT DETECTED NOT DETECTED Final    Cryptococcus neoformans/gattii NOT DETECTED NOT DETECTED Final   Methicillin resistance mecA/C NOT DETECTED NOT DETECTED Final    Comment: Performed at [facility], [address] [place], [place] [address]  CULTURE, BLOOD (ROUTINE X 2) w Reflex to ID Panel     Status: None (Preliminary result)   Collection Time: 01/18/21  9:26 AM   Specimen: BLOOD  Result Value Ref Range Status   Specimen Description BLOOD BLOOD LEFT HAND  Final   Special Requests   Final    BOTTLES DRAWN AEROBIC AND ANAEROBIC Blood Culture adequate volume   Culture   Final    NO GROWTH 2 DAYS Performed at [facility], [address] [place], [place] [address]    Report Status PENDING  Incomplete  CULTURE, BLOOD (ROUTINE X 2) w Reflex to ID Panel     Status: None (Preliminary result)   Collection Time: 01/18/21  9:28 AM   Specimen: BLOOD  Result Value Ref Range Status   Specimen Description BLOOD BLOOD RIGHT HAND  Final   Special Requests   Final    BOTTLES DRAWN AEROBIC AND ANAEROBIC Blood Culture adequate volume   Culture   Final    NO GROWTH 2 DAYS Performed at [facility], [address] [place], [place] [address]    Report Status PENDING  Incomplete     Time coordinating discharge: 40 minutes  SIGNED:   Alba Cory, MD  [facility]

## 2021-01-20 NOTE — TOC Progression Note (Signed)
Transition of Care [facility]) - Progression Note    Patient Details  Name: Joshua Mayo MRN: [id number] Date of Birth: October 06, 1975  [facility] CM/SW Contact  Caryn Section, RN Phone Number: 01/20/2021, 9:46 AM  Clinical Narrative:  patient states he anticipates discharge today.  He lives alone but has assistance from others if needed, up to date with PCP, no concerns about transportation or  medications.  He has no TOC needs, states he received SA resources.         Expected Discharge Plan and Services           Expected Discharge Date: 01/19/21                                     Social Determinants of Health (SDOH) Interventions    Readmission Risk Interventions No flowsheet data found.

## 2021-01-22 ENCOUNTER — Telehealth: Payer: Self-pay

## 2021-01-22 NOTE — Telephone Encounter (Signed)
Some test results came through. Looks like he is scheduled to come see you 01/28/2021.

## 2021-01-23 LAB — CULTURE, BLOOD (ROUTINE X 2)
Culture: NO GROWTH
Culture: NO GROWTH
Special Requests: ADEQUATE
Special Requests: ADEQUATE

## 2021-01-28 ENCOUNTER — Encounter: Payer: Self-pay | Admitting: Family Medicine

## 2021-01-28 ENCOUNTER — Other Ambulatory Visit: Payer: Self-pay

## 2021-01-28 ENCOUNTER — Ambulatory Visit: Payer: BC Managed Care – PPO | Admitting: Family Medicine

## 2021-01-28 VITALS — BP 86/57 | HR 58 | Temp 98.3°F | Resp 16 | Ht 67.0 in | Wt 171.0 lb

## 2021-01-28 DIAGNOSIS — I5022 Chronic systolic (congestive) heart failure: Secondary | ICD-10-CM | POA: Diagnosis not present

## 2021-01-28 DIAGNOSIS — N179 Acute kidney failure, unspecified: Secondary | ICD-10-CM

## 2021-01-28 DIAGNOSIS — I952 Hypotension due to drugs: Secondary | ICD-10-CM

## 2021-01-28 DIAGNOSIS — I426 Alcoholic cardiomyopathy: Secondary | ICD-10-CM | POA: Diagnosis not present

## 2021-01-28 DIAGNOSIS — F1010 Alcohol abuse, uncomplicated: Secondary | ICD-10-CM

## 2021-01-28 DIAGNOSIS — R569 Unspecified convulsions: Secondary | ICD-10-CM

## 2021-01-28 NOTE — Patient Instructions (Signed)
It was great to see you!  Our plans for today:  - Take 1/2 of your spironolactone dose (25mg , 1 tablet). - Keep your follow up with Cardiology.  - Come back next week for follow up.   We are checking some labs today, we will release these results to your MyChart.  Take care and seek immediate care sooner if you develop any concerns.   Dr. 

## 2021-01-28 NOTE — Progress Notes (Signed)
° °  SUBJECTIVE:   CHIEF COMPLAINT / HPI:   HOSPITAL FOLLOW UP Hospital/facility: [facility] 1/12-1/17 Diagnosis: seizure 2/2 alcohol withdrawal.  Procedures/tests:  - keppra loaded in ED - MRI NAICA - EEG nl - CT head/Cspine no acute findings Consultants: Neuro New medications:  - crestor due to h/o stroke - folic acid, thiamine Discharge instructions:   - f/u with Cardiology, PCP Status: better - no seizures since.  - last drink yesterday. 2 beers, few shots.  - no current withdrawal symptoms.  - is currently involved in rehab program - has had some orthostatic sx.  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 Continue to recommend weaning alcohol, watching for withdrawal signs. Continue with rehab program. Continue to follow with Cardiology.  AKI (acute kidney injury) (HCC) Recheck labs  Seizure (HCC) 2/2 alcohol binge. No further seizures since discharge. No antiepileptics recommended per Neuro. Imaging and EEG normal. Recommend continued alcohol wean and involvement in rehab program to prevent additional withdrawal sx.  Hypotension Low today and on recheck. Will decrease spironolactone dose to 25mg . F/u next week for recheck.      , DO

## 2021-01-29 ENCOUNTER — Encounter: Payer: Self-pay | Admitting: Family Medicine

## 2021-01-29 DIAGNOSIS — I959 Hypotension, unspecified: Secondary | ICD-10-CM | POA: Insufficient documentation

## 2021-01-29 LAB — BASIC METABOLIC PANEL
BUN/Creatinine Ratio: 12 (ref 9–20)
BUN: 29 mg/dL — ABNORMAL HIGH (ref 6–24)
CO2: 15 mmol/L — ABNORMAL LOW (ref 20–29)
Calcium: 9.9 mg/dL (ref 8.7–10.2)
Chloride: 106 mmol/L (ref 96–106)
Creatinine, Ser: 2.48 mg/dL — ABNORMAL HIGH (ref 0.76–1.27)
Glucose: 96 mg/dL (ref 70–99)
Potassium: 4.8 mmol/L (ref 3.5–5.2)
Sodium: 143 mmol/L (ref 134–144)
eGFR: 31 mL/min/{1.73_m2} — ABNORMAL LOW (ref >59)

## 2021-01-29 LAB — CBC
Hematocrit: 37.8 % (ref 37.5–51.0)
Hemoglobin: 12.2 g/dL — ABNORMAL LOW (ref 13.0–17.7)
MCH: 30.3 pg (ref 26.6–33.0)
MCHC: 32.3 g/dL (ref 31.5–35.7)
MCV: 94 fL (ref 79–97)
Platelets: 289 10*3/uL (ref 150–450)
RBC: 4.03 x10E6/uL — ABNORMAL LOW (ref 4.14–5.80)
RDW: 11.8 % (ref 11.6–15.4)
WBC: 7.5 10*3/uL (ref 3.4–10.8)

## 2021-01-29 NOTE — Assessment & Plan Note (Signed)
Low today and on recheck. Will decrease spironolactone dose to 25mg . F/u next week for recheck.

## 2021-01-29 NOTE — Assessment & Plan Note (Signed)
Recheck labs 

## 2021-01-29 NOTE — Assessment & Plan Note (Addendum)
2/2 alcohol binge. No further seizures since discharge. No antiepileptics recommended per Neuro. Imaging and EEG normal. Recommend continued alcohol wean and involvement in rehab program to prevent additional withdrawal sx.

## 2021-01-29 NOTE — Assessment & Plan Note (Signed)
Euvolemic on exam. Continue to recommend weaning alcohol, watching for withdrawal signs. Continue with rehab program. Continue to follow with Cardiology.

## 2021-02-02 DIAGNOSIS — F1020 Alcohol dependence, uncomplicated: Secondary | ICD-10-CM | POA: Diagnosis not present

## 2021-02-04 ENCOUNTER — Other Ambulatory Visit: Payer: Self-pay

## 2021-02-04 ENCOUNTER — Ambulatory Visit: Payer: BC Managed Care – PPO | Admitting: Family Medicine

## 2021-02-04 ENCOUNTER — Encounter: Payer: Self-pay | Admitting: Family Medicine

## 2021-02-04 VITALS — BP 108/83 | HR 95 | Temp 98.3°F | Wt 174.0 lb

## 2021-02-04 DIAGNOSIS — N179 Acute kidney failure, unspecified: Secondary | ICD-10-CM

## 2021-02-04 DIAGNOSIS — N62 Hypertrophy of breast: Secondary | ICD-10-CM | POA: Diagnosis not present

## 2021-02-04 DIAGNOSIS — I952 Hypotension due to drugs: Secondary | ICD-10-CM

## 2021-02-04 DIAGNOSIS — R569 Unspecified convulsions: Secondary | ICD-10-CM

## 2021-02-04 NOTE — Patient Instructions (Addendum)
It was great to see you!  Our plans for today:  - Stop the spironolactone. This will likely help your breast pain/enlargement.  - Let us know if you don't hear about an appointment with the neurologist.  We are checking some labs today, we will release these results to your MyChart.  Take care and seek immediate care sooner if you develop any concerns.   Dr. Linwood Dibbles   - Try the following to help you sleep better:  - limit naps during the day  - no screens (TV, phone, tablet, computer) at least 1-2 hours before bedtime.  - have a quiet and dark sleeping environment.  - no large meals or drinks about 1 hour before bed.  - Avoid taking diuretics (hydrochlorothiazide, furosemide) in the evenings.  - Avoid caffeine after 3pm.  - Exercise or move your body regularly every day.  - You can also try melatonin 5 mg over the counter. Take this 1-2 hours before bed. You can increase to 10mg  if this is not helpful. - If you are lying in bed for 30 mins-1 hour and aren't falling asleep, get out of bed and do something relaxing like reading (NO TV!) until you are tired.

## 2021-02-04 NOTE — Assessment & Plan Note (Signed)
With repeat seizure over the weekend. Recommended total alcohol cessation, patient currently declining inpatient rehab and already connected to appropriate resources.  Refer to neurology given multiple episodes to determine utility for antiepileptics in light of continued high risk alcohol use.

## 2021-02-04 NOTE — Assessment & Plan Note (Signed)
Improved with dose decrease of spironolactone though still with orthostatic sx and BP low normal today. Will stop spironolactone. Continue to monitor BP. Has f/u with Cardiology.

## 2021-02-04 NOTE — Assessment & Plan Note (Signed)
Recheck labs today. Stay hydrated.

## 2021-02-04 NOTE — Assessment & Plan Note (Signed)
Likely multifactorial with spironolactone, alcohol use, and underlying liver dysfunction. Stop spironolactone especially given hypotension. If need to restart in the future, recommend eplerenone. Also recommend alcohol cessation.

## 2021-02-04 NOTE — Progress Notes (Signed)
° ° °  SUBJECTIVE:   CHIEF COMPLAINT / HPI:   Cardiomyopathy, hypotension: - seen last week for hospital f/u, at that time hypotension to [id number] systolic. Decreased spironolactone. - has f/u with Cardiology 3/9 - Medications: entresto, coreg, spironolactone - Compliance: good - Checking BP at home: yes, SBP 80-130s. - Denies any SOB, CP, vision changes, LE edema, medication Ses - still with occasional lightheadedness  Alcohol-induced seizure - had seizure over the weekend, maybe Friday. Last drink prior to this Tuesday. Did lose consciousness. No loss of bowel or bladder. Was confused after. Thinks he may have had seizures prior to hospitalization. H/o binge drinking. Last drink yesterday, 1 beer. Finished outpatient rehab program, not interested in inpatient program. Has 2 therapists. Has recent increased stress, wife moved out, lost job with [employer] and recently started new business, doesn't have much contact with his children.   Breast mass - noticing increased breast tissue, occasionally painful and itching, less so recently. No nipple discharge.   OBJECTIVE:   BP 108/83 (BP Location: Right Arm, Patient Position: Sitting, Cuff Size: Normal)    Pulse 95    Temp 98.3 F (36.8 C) (Oral)    Wt 174 lb (78.9 kg)    SpO2 100%    BMI 27.25 kg/m   Gen: well appearing, in NAD Breast: increased glandular tissue underneath nipple, symmetric. No skin or nipple changes or discharge.  Card: RRR Lungs: CTAB Ext: WWP, no edema   ASSESSMENT/PLAN:   Hypotension Improved with dose decrease of spironolactone though still with orthostatic sx and BP low normal today. Will stop spironolactone. Continue to monitor BP. Has f/u with Cardiology.  AKI (acute kidney injury) ([place]) [place] labs today. Stay hydrated.   Seizure ([place]) With repeat seizure over the weekend. Recommended total alcohol cessation, patient currently declining inpatient rehab and already connected to appropriate resources.  Refer to  neurology given multiple episodes to determine utility for antiepileptics in light of continued high risk alcohol use.   Gynecomastia Likely multifactorial with spironolactone, alcohol use, and underlying liver dysfunction. Stop spironolactone especially given hypotension. If need to restart in the future, recommend eplerenone. Also recommend alcohol cessation.      Myles Gip, DO

## 2021-02-15 LAB — BASIC METABOLIC PANEL
BUN/Creatinine Ratio: 19 (ref 9–20)
BUN: 16 mg/dL (ref 6–24)
Calcium: 9.6 mg/dL (ref 8.7–10.2)
Chloride: 105 mmol/L (ref 96–106)
Creatinine, Ser: 0.85 mg/dL (ref 0.76–1.27)
Glucose: 109 mg/dL — ABNORMAL HIGH (ref 70–99)
Potassium: 4.1 mmol/L (ref 3.5–5.2)
Sodium: 143 mmol/L (ref 134–144)
eGFR: 108 mL/min/{1.73_m2} (ref >59)

## 2021-02-26 DIAGNOSIS — R519 Headache, unspecified: Secondary | ICD-10-CM | POA: Diagnosis not present

## 2021-02-26 DIAGNOSIS — Z87898 Personal history of other specified conditions: Secondary | ICD-10-CM | POA: Diagnosis not present

## 2021-02-26 DIAGNOSIS — R569 Unspecified convulsions: Secondary | ICD-10-CM | POA: Diagnosis not present

## 2021-03-06 DIAGNOSIS — R569 Unspecified convulsions: Secondary | ICD-10-CM | POA: Diagnosis not present

## 2021-03-12 DIAGNOSIS — J9601 Acute respiratory failure with hypoxia: Secondary | ICD-10-CM | POA: Diagnosis not present

## 2021-03-12 DIAGNOSIS — I5020 Unspecified systolic (congestive) heart failure: Secondary | ICD-10-CM | POA: Diagnosis not present

## 2021-03-12 DIAGNOSIS — F1010 Alcohol abuse, uncomplicated: Secondary | ICD-10-CM | POA: Diagnosis not present

## 2021-03-12 DIAGNOSIS — I426 Alcoholic cardiomyopathy: Secondary | ICD-10-CM | POA: Diagnosis not present

## 2021-04-03 DIAGNOSIS — R569 Unspecified convulsions: Secondary | ICD-10-CM | POA: Diagnosis not present

## 2021-04-04 DIAGNOSIS — R569 Unspecified convulsions: Secondary | ICD-10-CM | POA: Diagnosis not present

## 2021-04-05 DIAGNOSIS — R569 Unspecified convulsions: Secondary | ICD-10-CM | POA: Diagnosis not present

## 2021-05-01 ENCOUNTER — Emergency Department
Admission: EM | Admit: 2021-05-01 | Discharge: 2021-05-01 | Disposition: A | Discharge status: Home / Self Care | Payer: BC Managed Care – PPO | Attending: Emergency Medicine | Admitting: Emergency Medicine

## 2021-05-01 ENCOUNTER — Other Ambulatory Visit: Payer: Self-pay

## 2021-05-01 DIAGNOSIS — Y908 Blood alcohol level of 240 mg/100 ml or more: Secondary | ICD-10-CM | POA: Diagnosis not present

## 2021-05-01 DIAGNOSIS — F10239 Alcohol dependence with withdrawal, unspecified: Secondary | ICD-10-CM | POA: Insufficient documentation

## 2021-05-01 DIAGNOSIS — R45851 Suicidal ideations: Secondary | ICD-10-CM | POA: Diagnosis not present

## 2021-05-01 DIAGNOSIS — Z5321 Procedure and treatment not carried out due to patient leaving prior to being seen by health care provider: Secondary | ICD-10-CM | POA: Insufficient documentation

## 2021-05-01 LAB — COMPREHENSIVE METABOLIC PANEL
ALT: 26 U/L (ref 0–44)
AST: 26 U/L (ref 15–41)
Albumin: 4.3 g/dL (ref 3.5–5.0)
Alkaline Phosphatase: 46 U/L (ref 38–126)
Anion gap: 11 (ref 5–15)
BUN: 17 mg/dL (ref 6–20)
CO2: 23 mmol/L (ref 22–32)
Calcium: 9.3 mg/dL (ref 8.9–10.3)
Chloride: 109 mmol/L (ref 98–111)
Creatinine, Ser: 0.78 mg/dL (ref 0.61–1.24)
GFR, Estimated: >60 mL/min (ref >60)
Glucose, Bld: 94 mg/dL (ref 70–99)
Potassium: 3.5 mmol/L (ref 3.5–5.1)
Sodium: 143 mmol/L (ref 135–145)
Total Bilirubin: 0.7 mg/dL (ref 0.3–1.2)
Total Protein: 7.6 g/dL (ref 6.5–8.1)

## 2021-05-01 LAB — CBC
HCT: 42 % (ref 39.0–52.0)
Hemoglobin: 13.6 g/dL (ref 13.0–17.0)
MCH: 28.9 pg (ref 26.0–34.0)
MCHC: 32.4 g/dL (ref 30.0–36.0)
MCV: 89.2 fL (ref 80.0–100.0)
Platelets: 221 10*3/uL (ref 150–400)
RBC: 4.71 MIL/uL (ref 4.22–5.81)
RDW: 13 % (ref 11.5–15.5)
WBC: 7.3 10*3/uL (ref 4.0–10.5)
nRBC: 0 % (ref 0.0–0.2)

## 2021-05-01 LAB — ETHANOL: Alcohol, Ethyl (B): 272 mg/dL — ABNORMAL HIGH (ref <10)

## 2021-05-01 MED ORDER — NICOTINE 21 MG/24HR TD PT24
21.0000 mg | MEDICATED_PATCH | Freq: Every day | TRANSDERMAL | Status: DC
  Administered 2021-05-01: 21 mg via TRANSDERMAL

## 2021-05-01 NOTE — ED Notes (Signed)
Patient dressed out at this time with this RN and EDT Mayra ?Black sweatpants ?Black boxers ?Red t-shirts ?Grey hey dudes ?Silver chain necklace ?

## 2021-05-01 NOTE — ED Triage Notes (Signed)
Patient to ER via POV. States that this morning he was suicidal, states that this only lasted around 10 minutes. Report speaking to his therapist and is not longer suicidal. Patient wanting help with alcohol.  ? ?Patient states that he used to drink 1/5th Christiane Ha daily, has cut down to several drinks a day. ? ?Last drink today, approx 1/2 gallon of Jack Daniels/ beer/ vodka. Reports he has never experienced withdrawals.  ?

## 2021-07-15 ENCOUNTER — Other Ambulatory Visit: Payer: Self-pay | Admitting: Family Medicine

## 2021-07-15 NOTE — Telephone Encounter (Signed)
Medication Refill - Medication: acamprosate (CAMPRAL) 333 MG tablet   tadalafil (CIALIS) 5 MG tablet  Has the patient contacted their pharmacy? Yes.   (Agent: If no, request that the patient contact the pharmacy for the refill. If patient does not wish to contact the pharmacy document the reason why and proceed with request.) (Agent: If yes, when and what did the pharmacy advise?) request has not been responded to or approved  Preferred Pharmacy (with phone number or street name): [facility] [address], [place] - [address]  [address] [place], [place] [address]  Phone:  [phone]  Fax:  [phone]  DEA #:  [id number] Has the patient been seen for an appointment in the last year OR does the patient have an upcoming appointment? Yes.    Agent: Please be advised that RX refills may take up to 3 business days. We ask that you follow-up with your pharmacy.

## 2021-07-15 NOTE — Telephone Encounter (Signed)
Requested medication (s) are due for refill today: yes  Requested medication (s) are on the active medication list: yes  Last refill:  1/623 for Campral and 03/10/20 for Cialis  Future visit scheduled no  Notes to clinic:  Unable to refill per protocol, cannot delegate. Last refill from historical provider     Requested Prescriptions  Pending Prescriptions Disp Refills   acamprosate (CAMPRAL) 333 MG tablet      Sig: Take 2 tablets (666 mg total) by mouth 3 (three) times daily.     Not Delegated - Psychiatry:  Drug Dependence Therapy - acamprosate calcium Failed - 07/15/2021  2:31 PM      Failed - This refill cannot be delegated      Passed - Cr in normal range and within 360 days    Creatinine, Ser  Date Value Ref Range Status  05/01/2021 0.78 0.61 - 1.24 mg/dL Final         Passed - eGFR is 30 or above and within 360 days    GFR calc Af Amer  Date Value Ref Range Status  05/23/2018 129 >59 mL/min/1.73 Final   GFR, Estimated  Date Value Ref Range Status  05/01/2021 >60 >60 mL/min Final    Comment:    (NOTE) Calculated using the CKD-EPI Creatinine Equation (2021)    eGFR  Date Value Ref Range Status  02/04/2021 108 >59 mL/min/1.73 Final         Passed - Completed PHQ-2 or PHQ-9 in the last 360 days      Passed - Valid encounter within last 6 months    Recent Outpatient Visits           5 months ago AKI (acute kidney injury) [facility])   [facility] Myles Gip, DO   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     Urology: Erectile Dysfunction Agents Failed - 07/15/2021  2:31 PM      Failed - Last BP in normal range    BP Readings from Last 1 Encounters:  02/04/21 108/83         Passed - AST in normal range and within 360 days    AST  Date Value Ref Range Status  05/01/2021 26 15 - 41 U/L Final         Passed - ALT in normal range and within 360 days    ALT  Date Value Ref Range Status  05/01/2021 26 0 - 44 U/L Final         Passed - Valid encounter within last 12 months    Recent Outpatient Visits           5 months ago AKI (acute kidney injury) [place])   [facility] Myles Gip, DO   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

## 2021-08-25 ENCOUNTER — Other Ambulatory Visit: Payer: Self-pay | Admitting: Family Medicine

## 2021-08-25 DIAGNOSIS — F411 Generalized anxiety disorder: Secondary | ICD-10-CM

## 2021-09-23 ENCOUNTER — Encounter (HOSPITAL_COMMUNITY): Payer: Self-pay | Admitting: Emergency Medicine

## 2021-09-23 ENCOUNTER — Emergency Department (HOSPITAL_COMMUNITY)
Admission: EM | Admit: 2021-09-23 | Discharge: 2021-09-23 | Disposition: A | Discharge status: Home / Self Care | Payer: BC Managed Care – PPO | Attending: Emergency Medicine | Admitting: Emergency Medicine

## 2021-09-23 ENCOUNTER — Other Ambulatory Visit: Payer: Self-pay

## 2021-09-23 DIAGNOSIS — F10929 Alcohol use, unspecified with intoxication, unspecified: Secondary | ICD-10-CM | POA: Diagnosis not present

## 2021-09-23 DIAGNOSIS — I161 Hypertensive emergency: Secondary | ICD-10-CM | POA: Diagnosis not present

## 2021-09-23 DIAGNOSIS — S41111A Laceration without foreign body of right upper arm, initial encounter: Secondary | ICD-10-CM | POA: Insufficient documentation

## 2021-09-23 DIAGNOSIS — W08XXXA Fall from other furniture, initial encounter: Secondary | ICD-10-CM | POA: Diagnosis not present

## 2021-09-23 DIAGNOSIS — I10 Essential (primary) hypertension: Secondary | ICD-10-CM | POA: Diagnosis not present

## 2021-09-23 DIAGNOSIS — I426 Alcoholic cardiomyopathy: Secondary | ICD-10-CM | POA: Diagnosis not present

## 2021-09-23 DIAGNOSIS — I959 Hypotension, unspecified: Secondary | ICD-10-CM | POA: Diagnosis not present

## 2021-09-23 DIAGNOSIS — I5020 Unspecified systolic (congestive) heart failure: Secondary | ICD-10-CM | POA: Diagnosis not present

## 2021-09-23 DIAGNOSIS — Z79899 Other long term (current) drug therapy: Secondary | ICD-10-CM | POA: Insufficient documentation

## 2021-09-23 DIAGNOSIS — W19XXXA Unspecified fall, initial encounter: Secondary | ICD-10-CM | POA: Diagnosis not present

## 2021-09-23 DIAGNOSIS — I509 Heart failure, unspecified: Secondary | ICD-10-CM | POA: Diagnosis not present

## 2021-09-23 DIAGNOSIS — Z7982 Long term (current) use of aspirin: Secondary | ICD-10-CM | POA: Diagnosis not present

## 2021-09-23 DIAGNOSIS — Z23 Encounter for immunization: Secondary | ICD-10-CM | POA: Diagnosis not present

## 2021-09-23 DIAGNOSIS — R58 Hemorrhage, not elsewhere classified: Secondary | ICD-10-CM | POA: Diagnosis not present

## 2021-09-23 DIAGNOSIS — S4991XA Unspecified injury of right shoulder and upper arm, initial encounter: Secondary | ICD-10-CM | POA: Diagnosis not present

## 2021-09-23 MED ORDER — ACETAMINOPHEN 500 MG PO TABS
1000.0000 mg | ORAL_TABLET | Freq: Once | ORAL | Status: AC
  Administered 2021-09-23: 1000 mg via ORAL
  Filled 2021-09-23: qty 2

## 2021-09-23 MED ORDER — POVIDONE-IODINE 10 % EX SOLN
CUTANEOUS | Status: AC
  Filled 2021-09-23: qty 29.6

## 2021-09-23 MED ORDER — LIDOCAINE HCL (PF) 1 % IJ SOLN
INTRAMUSCULAR | Status: AC
  Administered 2021-09-23: 30 mL via INTRAMUSCULAR
  Filled 2021-09-23: qty 30

## 2021-09-23 NOTE — ED Provider Notes (Signed)
[facility] EMERGENCY DEPARTMENT Provider Note   CSN: [id number] Arrival date & time: 09/23/21  0150     History  Chief Complaint  Patient presents with   Laceration    Joshua Mayo is a 46 y.o. male.  Patient is a 46 year old male with past medical history of congestive heart failure, alcohol related cardiomyopathy, hypotension, GERD.  Patient presenting today for evaluation of a right arm laceration.  Patient was consuming alcohol this evening when he fell into a curio cabinet and lacerated the underside of his proximal right arm.  Patient applied a tourniquet with a belt prior to coming here.  He denies other injury.  The history is provided by the patient.       Home Medications Prior to Admission medications   Medication Sig Start Date End Date Taking? Authorizing Provider  acamprosate (CAMPRAL) 333 MG tablet Take 666 mg by mouth 3 (three) times daily. 01/09/21   [provider]  aspirin EC 81 MG tablet Take 81 mg by mouth daily. Swallow whole.    [provider]  carvedilol (COREG) 25 MG tablet Take 1 tablet (25 mg total) by mouth 2 (two) times daily with a meal. 04/15/20   Just, Azalee Course, FNP  ENTRESTO 24-26 MG Take 1 tablet by mouth 2 (two) times daily. 03/10/20   [provider]  escitalopram (LEXAPRO) 20 MG tablet TAKE 1 TABLET BY MOUTH EVERY DAY 08/25/21   Jacky Kindle, FNP  folic acid (FOLVITE) 1 MG tablet Take 1 tablet (1 mg total) by mouth daily. 01/20/21   Regalado, Belkys A, MD  Multiple Vitamin (MULTIVITAMIN WITH MINERALS) TABS tablet Take 1 tablet by mouth daily. 01/20/21   Regalado, Belkys A, MD  nicotine (NICODERM CQ - DOSED IN MG/24 HOURS) 21 mg/24hr patch Place 1 patch (21 mg total) onto the skin daily. 01/15/21 01/15/22  Willy Eddy, MD  ondansetron (ZOFRAN) 4 MG tablet Take 1 tablet (4 mg total) by mouth every 6 (six) hours as needed for nausea or vomiting. 01/19/21   Regalado, Belkys A, MD  rosuvastatin (CRESTOR) 10 MG tablet  Take 1 tablet (10 mg total) by mouth at bedtime. 01/19/21   Regalado, Belkys A, MD  tadalafil (CIALIS) 5 MG tablet Take 5 mg by mouth daily. 03/10/20   [provider]  thiamine 100 MG tablet Take 1 tablet (100 mg total) by mouth daily. 01/20/21   Regalado, Prentiss Bells, MD      Allergies    Banana    Review of Systems   Review of Systems  All other systems reviewed and are negative.   Physical Exam Updated Vital Signs BP (!) 136/102   Pulse 88   Temp 97.6 F (36.4 C) (Oral)   Resp 16   Ht 5\' 7"  (1.702 m)   Wt 79 kg   SpO2 100%   BMI 27.28 kg/m  Physical Exam Vitals and nursing note reviewed.  Constitutional:      General: He is not in acute distress.    Appearance: Normal appearance. He is not ill-appearing.     Comments: Patient appears intoxicated and odor of alcohol is present.  HENT:     Head: Normocephalic and atraumatic.  Pulmonary:     Effort: Pulmonary effort is normal.  Musculoskeletal:     Comments: There is a 7 cm, deep laceration to the posterior aspect of the right upper arm.  There is pulsatile bleeding from a small artery within the laceration.  Ulnar and radial  pulses are palpable and motor and sensation are intact throughout the entire hand.  Skin:    General: Skin is warm and dry.  Neurological:     Mental Status: He is alert and oriented to person, place, and time.    ED Results / Procedures / Treatments   Labs (all labs ordered are listed, but only abnormal results are displayed) Labs Reviewed - No data to display  EKG None  Radiology No results found.  Procedures Procedures    Medications Ordered in ED Medications  lidocaine (PF) (XYLOCAINE) 1 % injection (30 mLs Injection Given 09/23/21 0202)  povidone-iodine (BETADINE) 10 % external solution ( Topical Given 09/23/21 0202)    ED Course/ Medical Decision Making/ A&P  Patient presenting here with a laceration to the right upper arm.  There is arterial bleeding from a small branch  vessel.  This laceration was repaired as below.  A pressure dressing was applied as a small to moderate size hematoma accumulated under the wound during the repair.  Patient to be discharged leaving the dressing in place for at least 24 hours, then local wound care at home.  Sutures to be removed in 10 to 14 days.  LACERATION REPAIR Performed by: Veryl Speak Authorized by: Veryl Speak Consent: Verbal consent obtained. Risks and benefits: risks, benefits and alternatives were discussed Consent given by: patient Patient identity confirmed: provided demographic data Prepped and Draped in normal sterile fashion Wound explored  Laceration Location: Right upper arm  Laceration Length: 7 cm  No Foreign Bodies seen or palpated  Anesthesia: local infiltration  Local anesthetic: lidocaine 1% without epinephrine  Anesthetic total: 10 ml  Irrigation method: syringe Amount of cleaning: standard  Skin closure: 3-0 Ethilon  Number of sutures: 12  Technique: Simple interrupted  Patient tolerance: Patient tolerated the procedure well with no immediate complications.   Final Clinical Impression(s) / ED Diagnoses Final diagnoses:  None    Rx / DC Orders ED Discharge Orders     None         Veryl Speak, MD 09/23/21 [id number]

## 2021-09-23 NOTE — ED Notes (Signed)
ED Provider at bedside. 

## 2021-09-23 NOTE — ED Triage Notes (Signed)
Pt has been drinking alcohol tonight and fell into bookcase and has laceration to the right bicep.

## 2021-09-23 NOTE — Discharge Instructions (Signed)
Take ibuprofen 600 mg every 6 hours as needed for pain.  Leave dressing in place for the next 36 hours, then change dressings twice daily thereafter.  Sutures are to be removed in 10 to 14 days.  Please follow-up with your primary doctor for this.  Return to the ER if you develop any new and/or concerning symptoms.

## 2021-10-06 ENCOUNTER — Emergency Department (HOSPITAL_COMMUNITY)
Admission: EM | Admit: 2021-10-06 | Discharge: 2021-10-06 | Disposition: A | Discharge status: Home / Self Care | Payer: BC Managed Care – PPO | Attending: Emergency Medicine | Admitting: Emergency Medicine

## 2021-10-06 ENCOUNTER — Encounter (HOSPITAL_COMMUNITY): Payer: Self-pay | Admitting: Emergency Medicine

## 2021-10-06 ENCOUNTER — Other Ambulatory Visit: Payer: Self-pay

## 2021-10-06 DIAGNOSIS — Z4802 Encounter for removal of sutures: Secondary | ICD-10-CM

## 2021-10-06 DIAGNOSIS — S41111D Laceration without foreign body of right upper arm, subsequent encounter: Secondary | ICD-10-CM | POA: Insufficient documentation

## 2021-10-06 DIAGNOSIS — X58XXXD Exposure to other specified factors, subsequent encounter: Secondary | ICD-10-CM | POA: Diagnosis not present

## 2021-10-06 DIAGNOSIS — S4991XD Unspecified injury of right shoulder and upper arm, subsequent encounter: Secondary | ICD-10-CM | POA: Diagnosis not present

## 2021-10-06 NOTE — Discharge Instructions (Signed)
Contact a health care provider if: You have more redness, swelling, or pain around your wound. You have fluid or blood coming from your wound. You have new warmth, a rash, or hardness at the wound site. You have pus or a bad smell coming from your wound. Your wound opens up. Get help right away if: You have a fever or chills. You have red streaks coming from your wound. 

## 2021-10-06 NOTE — ED Triage Notes (Signed)
Pt presents for suture removal to right upper arm.

## 2021-10-06 NOTE — ED Provider Notes (Signed)
[facility] EMERGENCY DEPARTMENT Provider Note   CSN: [id number] Arrival date & time: 10/06/21  1348     History Chief Complaint  Patient presents with   Suture / Staple Removal    Joshua Mayo is a 46 y.o. male presents the emergency room for evaluation of suture removal of his upper right arm.  On 09-23-2021, patient sustained a laceration to his upper right arm and had 12 sutures placed.  Since then, patient reports he does have some intermittent numbness and tingling down the hand with some pain but overall is tolerable.  He mentions that he wears a pressure bandage occasionally, however he was concerned about the bump under the laceration.  Has any purulent discharge from the wound.  He can still move the next extend his elbow and wrist and wiggle his fingers.   Suture / Staple Removal       Home Medications Prior to Admission medications   Medication Sig Start Date End Date Taking? Authorizing Provider  acamprosate (CAMPRAL) 333 MG tablet Take 666 mg by mouth 3 (three) times daily. 01/09/21   [provider]  aspirin EC 81 MG tablet Take 81 mg by mouth daily. Swallow whole.    [provider]  carvedilol (COREG) 25 MG tablet Take 1 tablet (25 mg total) by mouth 2 (two) times daily with a meal. 04/15/20   Just, Azalee Course, FNP  ENTRESTO 24-26 MG Take 1 tablet by mouth 2 (two) times daily. 03/10/20   [provider]  escitalopram (LEXAPRO) 20 MG tablet TAKE 1 TABLET BY MOUTH EVERY DAY 08/25/21   Jacky Kindle, FNP  folic acid (FOLVITE) 1 MG tablet Take 1 tablet (1 mg total) by mouth daily. 01/20/21   Regalado, Belkys A, MD  Multiple Vitamin (MULTIVITAMIN WITH MINERALS) TABS tablet Take 1 tablet by mouth daily. 01/20/21   Regalado, Belkys A, MD  nicotine (NICODERM CQ - DOSED IN MG/24 HOURS) 21 mg/24hr patch Place 1 patch (21 mg total) onto the skin daily. 01/15/21 01/15/22  Willy Eddy, MD  ondansetron (ZOFRAN) 4 MG tablet Take 1 tablet (4 mg total) by  mouth every 6 (six) hours as needed for nausea or vomiting. 01/19/21   Regalado, Belkys A, MD  rosuvastatin (CRESTOR) 10 MG tablet Take 1 tablet (10 mg total) by mouth at bedtime. 01/19/21   Regalado, Belkys A, MD  tadalafil (CIALIS) 5 MG tablet Take 5 mg by mouth daily. 03/10/20   [provider]  thiamine 100 MG tablet Take 1 tablet (100 mg total) by mouth daily. 01/20/21   Regalado, Jon Billings A, MD      Allergies    Banana    Review of Systems   Review of Systems See HPI Physical Exam Updated Vital Signs BP (!) 140/102   Pulse 89   Temp 98 F (36.7 C) (Oral)   Resp 18   Ht 5\' 7"  (1.702 m)   Wt 79.4 kg   SpO2 97%   BMI 27.41 kg/m  Physical Exam Vitals and nursing note reviewed.  Constitutional:      Appearance: Normal appearance.  Eyes:     General: No scleral icterus. Pulmonary:     Effort: Pulmonary effort is normal. No respiratory distress.  Skin:    General: Skin is dry.     Findings: No rash.     Comments: 12 intact sutures noted to the more posterior aspect of the right upper arm.  Tattoo present.  He does have some faded bruising  noted to the upper arm as well.  No purulent discharge noted from the wound.  He does have an approximately egg sized hematoma underneath the laceration.  Nonpulsatile.  It is firm however pliable.  Is not circumferential.  His compartments are soft.  Sensation intact.  He has full flexion-extension of his elbow and his wrist.  Palpable pulses.  Good grip strength.  Neurological:     General: No focal deficit present.     Mental Status: He is alert. Mental status is at baseline.  Psychiatric:        Mood and Affect: Mood normal.     ED Results / Procedures / Treatments   Labs (all labs ordered are listed, but only abnormal results are displayed) Labs Reviewed - No data to display  EKG None  Radiology No results found.  Procedures .Suture Removal  Date/Time: 10/06/2021 3:04 PM  Performed by: Sherrell Puller, PA-C Authorized  by: Sherrell Puller, PA-C   Consent:    Consent obtained:  Verbal   Consent given by:  Patient   Risks, benefits, and alternatives were discussed: yes   Universal protocol:    Procedure explained and questions answered to patient or proxy's satisfaction: yes   Location:    Location:  Upper extremity   Upper extremity location:  Arm   Arm location:  R upper arm Procedure details:    Wound appearance:  No signs of infection, good wound healing, clean and nonpurulent (Egg sized hematoma underlying laceration)   Number of sutures removed:  12 Post-procedure details:    Post-removal:  Steri-Strips applied   Procedure completion:  Tolerated well, no immediate complications    Medications Ordered in ED Medications - No data to display  ED Course/ Medical Decision Making/ A&P                           Medical Decision Making  46 year old male presents to the emergency room for evaluation of suture removal.  Vital signs shows elevated blood pressure otherwise afebrile, normal pulse rate, satting well room without increased work of breathing.  We will see him as noted above.  I counted 12 sutures for suture removal.  Wound is intact although slightly lift around the middle area.  We will apply Steri-Strips for further continuation of healing for this laceration.  There is no signs of infection.  No drainage.  No surrounding erythema.  I recommend he continue with the compression for the hematoma underlying.  Nursing applied before patient discharge.  Discussed that this patient may take a few weeks for this to dissipate.  We discussed strict return precautions red flag symptoms.  Patient verbalizes understanding and agrees to the plan.  Patient is stable being discharged home in good condition.  I discussed this case with my attending physician who cosigned this note including patient's presenting symptoms, physical exam, and planned diagnostics and interventions. Attending physician stated  agreement with plan or made changes to plan which were implemented.   Final Clinical Impression(s) / ED Diagnoses Final diagnoses:  Visit for suture removal    Rx / DC Orders ED Discharge Orders     None         Sherrell Puller, PA-C 10/06/21 1506    Isla Pence, MD 10/06/21 1551

## 2021-12-17 ENCOUNTER — Other Ambulatory Visit: Payer: Self-pay | Admitting: Family Medicine

## 2021-12-17 DIAGNOSIS — F411 Generalized anxiety disorder: Secondary | ICD-10-CM

## 2021-12-17 NOTE — Telephone Encounter (Signed)
Refilled 08/25/2021 #30 5 rf. 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

## 2022-01-07 ENCOUNTER — Ambulatory Visit
Admission: EM | Admit: 2022-01-07 | Discharge: 2022-01-07 | Disposition: A | Discharge status: Home / Self Care | Payer: Self-pay | Attending: Family Medicine | Admitting: Family Medicine

## 2022-01-07 DIAGNOSIS — J22 Unspecified acute lower respiratory infection: Secondary | ICD-10-CM

## 2022-01-07 DIAGNOSIS — R062 Wheezing: Secondary | ICD-10-CM

## 2022-01-07 DIAGNOSIS — R000 Tachycardia, unspecified: Secondary | ICD-10-CM

## 2022-01-07 MED ORDER — DEXAMETHASONE SODIUM PHOSPHATE 10 MG/ML IJ SOLN
10.0000 mg | Freq: Once | INTRAMUSCULAR | Status: AC
  Administered 2022-01-07: 10 mg via INTRAMUSCULAR

## 2022-01-07 MED ORDER — DOXYCYCLINE HYCLATE 100 MG PO CAPS: 100.0000 mg | ORAL_CAPSULE | Freq: Two times a day (BID) | ORAL | 0 refills | Status: DC

## 2022-01-07 MED ORDER — ALBUTEROL SULFATE HFA 108 (90 BASE) MCG/ACT IN AERS: 2.0000 | INHALATION_SPRAY | RESPIRATORY_TRACT | 0 refills | Status: DC | PRN

## 2022-01-07 MED ORDER — PROMETHAZINE-DM 6.25-15 MG/5ML PO SYRP: 5.0000 mL | ORAL_SOLUTION | Freq: Four times a day (QID) | ORAL | 0 refills | Status: DC | PRN

## 2022-01-07 MED ORDER — IPRATROPIUM-ALBUTEROL 0.5-2.5 (3) MG/3ML IN SOLN
3.0000 mL | Freq: Once | RESPIRATORY_TRACT | Status: AC
  Administered 2022-01-07: 3 mL via RESPIRATORY_TRACT

## 2022-01-07 NOTE — ED Provider Notes (Signed)
[facility]    CSN: [id number] Arrival date & time: 01/07/22  1315      History   Chief Complaint No chief complaint on file.   HPI Joshua Mayo is a 47 y.o. male.   Presenting today with about a month of waxing and waning congestion, dry hacking cough, shortness of breath, wheezing, headaches, nausea, fatigue.  Denies fever, chest pain, abdominal pain, vomiting or diarrhea.  So far trying DayQuil, NyQuil, vitamins and Sudafed with no relief.  No known history of chronic pulmonary disease but has had had aspiration pneumonia, acute respiratory failure and has numerous cardiac conditions including CHF, hypertension, alcoholic cardiomyopathy.    Past Medical History:  Diagnosis Date   CHF (congestive heart failure) (HCC)    Hypertension    Hypertensive emergency    Seizures [facility])     Patient Active Problem List   Diagnosis Date Noted   Gynecomastia 02/04/2021   Hypotension 01/29/2021   Seizure (HCC) 01/15/2021   Aspiration pneumonia (HCC) 01/15/2021   Alcoholic cardiomyopathy (HCC) 63/89/3734   Chronic systolic CHF (congestive heart failure) (HCC) 01/15/2021   AKI (acute kidney injury) (HCC) 01/15/2021   Erythema of colon    Rectal polyp    Abdominal pain    Gastric erythema    Chest pain 03/20/2020   Alcohol abuse 03/20/2020   Tobacco abuse 03/20/2020   Diarrhea 03/20/2020   Melena 03/20/2020   Unstable angina (HCC)    Elevated LFTs    Acute respiratory failure with hypoxia (HCC) 05/15/2018   Esophageal reflux 01/08/2016   Internal hemorrhoids without complication 01/08/2016   Snoring 01/08/2016    Past Surgical History:  Procedure Laterality Date   COLONOSCOPY WITH PROPOFOL N/A 04/03/2020   Procedure: COLONOSCOPY WITH PROPOFOL;  Surgeon: Pasty Spillers, MD;  Location: [facility] ENDOSCOPY;  Service: Endoscopy;  Laterality: N/A;   ESOPHAGOGASTRODUODENOSCOPY (EGD) WITH PROPOFOL N/A 04/03/2020   Procedure: ESOPHAGOGASTRODUODENOSCOPY (EGD) WITH  PROPOFOL;  Surgeon: Pasty Spillers, MD;  Location: [facility] ENDOSCOPY;  Service: Endoscopy;  Laterality: N/A;       Home Medications    Prior to Admission medications   Medication Sig Start Date End Date Taking? Authorizing Provider  albuterol (VENTOLIN HFA) 108 (90 Base) MCG/ACT inhaler Inhale 2 puffs into the lungs every 4 (four) hours as needed for wheezing or shortness of breath. 01/07/22  Yes Particia Nearing, PA-C  doxycycline (VIBRAMYCIN) 100 MG capsule Take 1 capsule (100 mg total) by mouth 2 (two) times daily. 01/07/22  Yes Particia Nearing, PA-C  promethazine-dextromethorphan (PROMETHAZINE-DM) 6.25-15 MG/5ML syrup Take 5 mLs by mouth 4 (four) times daily as needed. 01/07/22  Yes Particia Nearing, PA-C  acamprosate (CAMPRAL) 333 MG tablet Take 666 mg by mouth 3 (three) times daily. 01/09/21   [provider]  aspirin EC 81 MG tablet Take 81 mg by mouth daily. Swallow whole.    [provider]  carvedilol (COREG) 25 MG tablet Take 1 tablet (25 mg total) by mouth 2 (two) times daily with a meal. 04/15/20   Just, Azalee Course, FNP  ENTRESTO 24-26 MG Take 1 tablet by mouth 2 (two) times daily. 03/10/20   [provider]  escitalopram (LEXAPRO) 20 MG tablet TAKE 1 TABLET BY MOUTH EVERY DAY 08/25/21   Jacky Kindle, FNP  folic acid (FOLVITE) 1 MG tablet Take 1 tablet (1 mg total) by mouth daily. 01/20/21   Regalado, Belkys A, MD  Multiple Vitamin (MULTIVITAMIN WITH MINERALS) TABS tablet Take 1  tablet by mouth daily. 01/20/21   Regalado, Belkys A, MD  nicotine (NICODERM CQ - DOSED IN MG/24 HOURS) 21 mg/24hr patch Place 1 patch (21 mg total) onto the skin daily. 01/15/21 01/15/22  Merlyn Lot, MD  ondansetron (ZOFRAN) 4 MG tablet Take 1 tablet (4 mg total) by mouth every 6 (six) hours as needed for nausea or vomiting. 01/19/21   Regalado, Belkys A, MD  rosuvastatin (CRESTOR) 10 MG tablet Take 1 tablet (10 mg total) by mouth at bedtime. 01/19/21   Regalado,  Belkys A, MD  tadalafil (CIALIS) 5 MG tablet Take 5 mg by mouth daily. 03/10/20   [provider]  thiamine 100 MG tablet Take 1 tablet (100 mg total) by mouth daily. 01/20/21   Regalado, Cassie Freer, MD    Family History Family History  Problem Relation Age of Onset   Heart disease Father    Heart disease Paternal Grandfather     Social History Social History   Tobacco Use   Smoking status: Every Day    Packs/day: 0.50    Years: 20.00    Total pack years: 10.00    Types: Cigarettes   Smokeless tobacco: Never  Vaping Use   Vaping Use: Never used  Substance Use Topics   Alcohol use: Yes    Alcohol/week: 23.0 standard drinks of alcohol    Types: 3 Cans of beer, 20 Standard drinks or equivalent per week    Comment: weekends pint to a 5th    Drug use: No    Types: Cocaine    Comment: previously used cocaine. Pt states he stopped on his own with the help of his current wife      Allergies   Banana   Review of Systems Review of Systems Per HPI  Physical Exam Triage Vital Signs ED Triage Vitals  Enc Vitals Group     BP 01/07/22 1354 112/80     Pulse Rate 01/07/22 1354 (!) 107     Resp 01/07/22 1354 18     Temp 01/07/22 1354 98.1 F (36.7 C)     Temp Source 01/07/22 1354 Oral     SpO2 01/07/22 1354 96 %     Weight --      Height --      Head Circumference --      Peak Flow --      Pain Score 01/07/22 1355 3     Pain Loc --      Pain Edu? --      Excl. in [place]? --    No data found.  Updated Vital Signs BP 112/80 (BP Location: Right Arm)   Pulse (!) 107   Temp 98.1 F (36.7 C) (Oral)   Resp 18   SpO2 96%   Visual Acuity Right Eye Distance:   Left Eye Distance:   Bilateral Distance:    Right Eye Near:   Left Eye Near:    Bilateral Near:     Physical Exam Vitals and nursing note reviewed.  Constitutional:      Appearance: He is well-developed.  HENT:     Head: Atraumatic.     Right Ear: External ear normal.     Left Ear: External ear  normal.     Nose: Congestion present.     Mouth/Throat:     Mouth: Mucous membranes are moist.     Pharynx: Posterior oropharyngeal erythema present. No oropharyngeal exudate.  Eyes:     Conjunctiva/sclera: Conjunctivae normal.  Pupils: Pupils are equal, round, and reactive to light.  Cardiovascular:     Rate and Rhythm: Normal rate and regular rhythm.  Pulmonary:     Effort: Pulmonary effort is normal. No respiratory distress.     Breath sounds: Wheezing present. No rales.  Musculoskeletal:        General: Normal range of motion.     Cervical back: Normal range of motion and neck supple.  Lymphadenopathy:     Cervical: No cervical adenopathy.  Skin:    General: Skin is warm and dry.  Neurological:     Mental Status: He is alert and oriented to person, place, and time.  Psychiatric:        Behavior: Behavior normal.      UC Treatments / Results  Labs (all labs ordered are listed, but only abnormal results are displayed) Labs Reviewed - No data to display  EKG   Radiology No results found.  Procedures Procedures (including critical care time)  Medications Ordered in UC Medications  ipratropium-albuterol (DUONEB) 0.5-2.5 (3) MG/3ML nebulizer solution 3 mL (3 mLs Nebulization Given 01/07/22 1416)  dexamethasone (DECADRON) injection 10 mg (10 mg Intramuscular Given 01/07/22 1444)    Initial Impression / Assessment and Plan / UC Course  I have reviewed the triage vital signs and the nursing notes.  Pertinent labs & imaging results that were available during my care of the patient were reviewed by me and considered in my medical decision making (see chart for details).     Overall vital signs reassuring, mildly tachycardic but oxygen saturation 96% on room air.  Significant improvement after DuoNeb treatment in clinic, given duration and worsening course of symptoms will treat with IM Decadron, doxycycline, albuterol, Phenergan DM and discussed Mucinex and other  supportive medications and home care.  Return for worsening symptoms.  Final Clinical Impressions(s) / UC Diagnoses   Final diagnoses:  Lower respiratory infection  Wheezing  Tachycardia   Discharge Instructions   None    ED Prescriptions     Medication Sig Dispense Auth. Provider   doxycycline (VIBRAMYCIN) 100 MG capsule Take 1 capsule (100 mg total) by mouth 2 (two) times daily. 20 capsule Volney American, [place]   albuterol (VENTOLIN HFA) 108 (90 Base) MCG/ACT inhaler Inhale 2 puffs into the lungs every 4 (four) hours as needed for wheezing or shortness of breath. [place], [place]   promethazine-dextromethorphan (PROMETHAZINE-DM) 6.25-15 MG/5ML syrup Take 5 mLs by mouth 4 (four) times daily as needed. 100 mL Volney American, [place]      PDMP not reviewed this encounter.   Volney American, [place] 01/07/22 1805

## 2022-01-07 NOTE — ED Triage Notes (Signed)
Pt reports feeling congested, dry cough, nauseas, sob, headaches x 1 month. Took daquil, nyquil, vitiman c, sudafed, and b 12.

## 2022-01-29 ENCOUNTER — Other Ambulatory Visit: Payer: Self-pay | Admitting: Family Medicine

## 2022-01-29 NOTE — Telephone Encounter (Signed)
Requested medications are due for refill today.  unsure  Requested medications are on the active medications list.  yes  Last refill. 01/07/2022 18 0 rf  Future visit scheduled.   no  Notes to clinic.  Rx signed by Avon Gully. Please review for refill.    Requested Prescriptions  Pending Prescriptions Disp Refills   albuterol (VENTOLIN HFA) 108 (90 Base) MCG/ACT inhaler [Pharmacy Med Name: ALBUTEROL HFA (VENTOLIN) INH] 18 each     Sig: INHALE 2 PUFFS INTO THE LUNGS EVERY 4 HOURS AS NEEDED FOR WHEEZING OR SHORTNESS OF BREATH.     There is no refill protocol information for this order

## 2022-04-11 ENCOUNTER — Other Ambulatory Visit: Payer: Self-pay | Admitting: Family Medicine

## 2022-04-11 DIAGNOSIS — F411 Generalized anxiety disorder: Secondary | ICD-10-CM

## 2022-04-12 NOTE — Telephone Encounter (Signed)
Requested medications are due for refill today.  yes  Requested medications are on the active medications list.  yes  Last refill. 08/25/2021 #30 5 rf  Future visit scheduled.   Yes - 3 days  Notes to clinic.  Pt is more than 3 months overdue for OV. Please review for refill.    Requested Prescriptions  Pending Prescriptions Disp Refills   escitalopram (LEXAPRO) 20 MG tablet [Pharmacy Med Name: ESCITALOPRAM 20 MG TABLET] 30 tablet 5    Sig: TAKE 1 TABLET BY MOUTH EVERY DAY     Psychiatry:  Antidepressants - SSRI Failed - 04/11/2022  1:17 AM      Failed - Valid encounter within last 6 months    Recent Outpatient Visits           1 year ago AKI (acute kidney injury) [facility])   [place] [facility] Caro Laroche, DO   1 year ago Alcoholic cardiomyopathy [facility])   [place] [facility] Caro Laroche, DO   1 year ago Generalized anxiety disorder   [place] [facility] Chrismon, Jodell Cipro, PA-C   1 year ago Hospital discharge follow-up   [facility] Just, Azalee Course, FNP   2 years ago Generalized anxiety disorder   [facility] Chrismon, Jodell Cipro, PA-C       Future Appointments             In 3 days Jacky Kindle, FNP [facility]

## 2022-04-12 NOTE — Telephone Encounter (Signed)
Upcoming Appt 04/15/2022 at 11:00am

## 2022-04-15 ENCOUNTER — Telehealth (INDEPENDENT_AMBULATORY_CARE_PROVIDER_SITE_OTHER): Payer: Self-pay | Admitting: Family Medicine

## 2022-04-15 ENCOUNTER — Encounter: Payer: Self-pay | Admitting: Family Medicine

## 2022-04-15 DIAGNOSIS — R569 Unspecified convulsions: Secondary | ICD-10-CM

## 2022-04-15 DIAGNOSIS — I5022 Chronic systolic (congestive) heart failure: Secondary | ICD-10-CM

## 2022-04-15 DIAGNOSIS — F17210 Nicotine dependence, cigarettes, uncomplicated: Secondary | ICD-10-CM

## 2022-04-15 DIAGNOSIS — F17200 Nicotine dependence, unspecified, uncomplicated: Secondary | ICD-10-CM | POA: Insufficient documentation

## 2022-04-15 DIAGNOSIS — F1421 Cocaine dependence, in remission: Secondary | ICD-10-CM | POA: Insufficient documentation

## 2022-04-15 DIAGNOSIS — F1020 Alcohol dependence, uncomplicated: Secondary | ICD-10-CM | POA: Insufficient documentation

## 2022-04-15 DIAGNOSIS — I426 Alcoholic cardiomyopathy: Secondary | ICD-10-CM

## 2022-04-15 DIAGNOSIS — F411 Generalized anxiety disorder: Secondary | ICD-10-CM | POA: Insufficient documentation

## 2022-04-15 MED ORDER — VENLAFAXINE HCL 37.5 MG PO TABS: 37.5000 mg | ORAL_TABLET | Freq: Two times a day (BID) | ORAL | 0 refills | Status: DC

## 2022-04-15 MED ORDER — CARVEDILOL 25 MG PO TABS: 25.0000 mg | ORAL_TABLET | Freq: Two times a day (BID) | ORAL | 0 refills | Status: AC

## 2022-04-15 MED ORDER — ESCITALOPRAM OXALATE 20 MG PO TABS: 20.0000 mg | ORAL_TABLET | Freq: Every day | ORAL | 0 refills | Status: DC

## 2022-04-15 MED ORDER — ENTRESTO 24-26 MG PO TABS: 1.0000 | ORAL_TABLET | Freq: Two times a day (BID) | ORAL | 0 refills | Status: AC

## 2022-04-15 MED ORDER — ROSUVASTATIN CALCIUM 40 MG PO TABS: 40.0000 mg | ORAL_TABLET | Freq: Every day | ORAL | 0 refills | Status: DC

## 2022-04-15 MED ORDER — LEVETIRACETAM 500 MG PO TABS: 500.0000 mg | ORAL_TABLET | Freq: Two times a day (BID) | ORAL | 0 refills | Status: DC

## 2022-04-15 MED ORDER — ASPIRIN 81 MG PO TBEC: 81.0000 mg | DELAYED_RELEASE_TABLET | Freq: Every day | ORAL | 0 refills | Status: AC

## 2022-04-15 NOTE — Assessment & Plan Note (Signed)
Patient reports he has been clean from cocaine for multiple years; does not have recent UDS on file.

## 2022-04-15 NOTE — Assessment & Plan Note (Signed)
Patient previously noted to be euvolemic on exam; however, unable to assess at this time given nature of A/V visit. Patient continues to drink ETOH, in excess, and is aware that his heart concerns are a secondary effect from previous ETOH use/misuse. He denies any withdrawal. He will need to establish care once moved outside of [place].

## 2022-04-15 NOTE — Assessment & Plan Note (Signed)
Previous seizure noted in setting of withdrawal; continues on keppra 500 mg to assist with further prevention of further seizures

## 2022-04-15 NOTE — Assessment & Plan Note (Signed)
Patient continues to drink 16 drinks/day; previously maintained on camprol to assist with reduction with goal of prevention of further exacerbation of heart failure

## 2022-04-15 NOTE — Assessment & Plan Note (Signed)
Chronic tobacco use; patient continues to decline further cessation despite health benefit for heart failure

## 2022-04-15 NOTE — Progress Notes (Signed)
I,J'ya E Hunter,acting as a scribe for Jacky Kindle, FNP.,have documented all relevant documentation on the behalf of Jacky Kindle, FNP,as directed by  Jacky Kindle, FNP while in the presence of Jacky Kindle, FNP.  MyChart Video Visit  Virtual Visit via Video Note   This format is felt to be most appropriate for this patient at this time. Physical exam was limited by quality of the video and audio technology used for the visit.   Patient location: Home  Provider location: [facility]   I discussed the limitations of evaluation and management by telemedicine and the availability of in person appointments. The patient expressed understanding and agreed to proceed.  Patient: Joshua Mayo   DOB: 1975-08-22   47 y.o. Male  MRN: [id number] Visit Date: 04/15/2022  Today's healthcare provider: Jacky Kindle, FNP  Introduced to nurse practitioner role and practice setting.  All questions answered.  Discussed provider/patient relationship and expectations.  Subjective    HPI   Patient has concerns about the cost of Rx because he does not have insurance. Reports that he is moving to [place] in May. He is requesting a 90 day supply. Patient is aware that he needs a colon cancer screening.    Medications: Outpatient Medications Prior to Visit  Medication Sig   [DISCONTINUED] aspirin EC 81 MG tablet Take 81 mg by mouth daily. Swallow whole.   [DISCONTINUED] carvedilol (COREG) 25 MG tablet Take 1 tablet (25 mg total) by mouth 2 (two) times daily with a meal.   [DISCONTINUED] ENTRESTO 24-26 MG Take 1 tablet by mouth 2 (two) times daily.   [DISCONTINUED] escitalopram (LEXAPRO) 20 MG tablet TAKE 1 TABLET BY MOUTH EVERY DAY   [DISCONTINUED] levETIRAcetam (KEPPRA) 500 MG tablet Take 500 mg by mouth 2 (two) times daily.   [DISCONTINUED] Multiple Vitamin (MULTIVITAMIN WITH MINERALS) TABS tablet Take 1 tablet by mouth daily.   [DISCONTINUED] ondansetron (ZOFRAN) 4 MG tablet Take 1  tablet (4 mg total) by mouth every 6 (six) hours as needed for nausea or vomiting.   [DISCONTINUED] rosuvastatin (CRESTOR) 10 MG tablet Take 1 tablet (10 mg total) by mouth at bedtime.   [DISCONTINUED] tadalafil (CIALIS) 5 MG tablet Take 5 mg by mouth daily.   [DISCONTINUED] thiamine 100 MG tablet Take 1 tablet (100 mg total) by mouth daily.   [DISCONTINUED] venlafaxine (EFFEXOR) 37.5 MG tablet Take 37.5 mg by mouth 2 (two) times daily with a meal.   [DISCONTINUED] acamprosate (CAMPRAL) 333 MG tablet Take 666 mg by mouth 3 (three) times daily. (Patient not taking: Reported on 04/15/2022)   [DISCONTINUED] albuterol (VENTOLIN HFA) 108 (90 Base) MCG/ACT inhaler Inhale 2 puffs into the lungs every 4 (four) hours as needed for wheezing or shortness of breath. (Patient not taking: Reported on 04/15/2022)   [DISCONTINUED] doxycycline (VIBRAMYCIN) 100 MG capsule Take 1 capsule (100 mg total) by mouth 2 (two) times daily. (Patient not taking: Reported on 04/15/2022)   [DISCONTINUED] folic acid (FOLVITE) 1 MG tablet Take 1 tablet (1 mg total) by mouth daily. (Patient not taking: Reported on 04/15/2022)   [DISCONTINUED] promethazine-dextromethorphan (PROMETHAZINE-DM) 6.25-15 MG/5ML syrup Take 5 mLs by mouth 4 (four) times daily as needed. (Patient not taking: Reported on 04/15/2022)   No facility-administered medications prior to visit.    Review of Systems   Objective    There were no vitals taken for this visit.  Physical Exam Pulmonary:     Effort: Pulmonary effort is normal.  Neurological:  Mental Status: He is alert and oriented to person, place, and time.  Psychiatric:        Mood and Affect: Mood normal.        Behavior: Behavior normal.        Thought Content: Thought content normal.        Judgment: Judgment normal.      Assessment & Plan     Problem List Items Addressed This Visit       Cardiovascular and Mediastinum   Alcoholic cardiomyopathy    Patient previously noted to be  euvolemic on exam; however, unable to assess at this time given nature of A/V visit. Patient continues to drink ETOH, in excess, and is aware that his heart concerns are a secondary effect from previous ETOH use/misuse. He denies any withdrawal. He will need to establish care once moved outside of [place].      Relevant Medications   carvedilol (COREG) 25 MG tablet   aspirin EC 81 MG tablet   ENTRESTO 24-26 MG   levETIRAcetam (KEPPRA) 500 MG tablet   rosuvastatin (CRESTOR) 40 MG tablet   Chronic systolic CHF (congestive heart failure) - Primary    Previously followed by cardiology on goal directed therapies as well including - coreg 25 - asa 81 - entresto 24-26 - crestor 40 Patient unable to update any vital signs at this time; however, he has no health complaints      Relevant Medications   carvedilol (COREG) 25 MG tablet   aspirin EC 81 MG tablet   ENTRESTO 24-26 MG   rosuvastatin (CRESTOR) 40 MG tablet     Other   Alcohol use disorder, severe, dependence    Patient continues to drink 16 drinks/day; previously maintained on camprol to assist with reduction with goal of prevention of further exacerbation of heart failure      Generalized anxiety disorder    Chronic, well controlled Patient wishes to continue on both lexapro 20 mg and effexor 37.5 Denies SI or HI at this time      Relevant Medications   escitalopram (LEXAPRO) 20 MG tablet   venlafaxine (EFFEXOR) 37.5 MG tablet   History of cocaine dependence    Patient reports he has been clean from cocaine for multiple years; does not have recent UDS on file.      Seizure    Previous seizure noted in setting of withdrawal; continues on keppra 500 mg to assist with further prevention of further seizures      Relevant Medications   levETIRAcetam (KEPPRA) 500 MG tablet   Tobacco use disorder    Chronic tobacco use; patient continues to decline further cessation despite health benefit for heart failure      No follow-ups  on file.    I discussed the assessment and treatment plan with the patient. The patient was provided an opportunity to ask questions and all were answered. The patient agreed with the plan and demonstrated an understanding of the instructions.   The patient was advised to call back or seek an in-person evaluation if the symptoms worsen or if the condition fails to improve as anticipated.  I provided 10 minutes of face-to-face time during this encounter to ensure chronic medications for both heart failure and mental health are maintained as patient prepares to move from [facility] to [place].  I, Jacky Kindle, FNP, have reviewed all documentation for this visit. The documentation on 04/15/22 for the exam, diagnosis, procedures, and orders are all accurate and complete.  Jacky KindleElise T Quenton Recendez, FNP [facility] [phone] (phone) [phone] (fax)  [facility]

## 2022-04-15 NOTE — Assessment & Plan Note (Addendum)
Previously followed by cardiology on goal directed therapies as well including - coreg 25 - asa 81 - entresto 24-26 - crestor 40 Patient unable to update any vital signs at this time; however, he has no health complaints

## 2022-04-15 NOTE — Assessment & Plan Note (Signed)
Chronic, well controlled Patient wishes to continue on both lexapro 20 mg and effexor 37.5 Denies SI or HI at this time

## 2022-05-13 ENCOUNTER — Emergency Department (HOSPITAL_COMMUNITY): Payer: Self-pay

## 2022-05-13 ENCOUNTER — Emergency Department (HOSPITAL_COMMUNITY)
Admission: EM | Admit: 2022-05-13 | Discharge: 2022-05-13 | Disposition: A | Discharge status: Home / Self Care | Payer: Self-pay | Attending: Emergency Medicine | Admitting: Emergency Medicine

## 2022-05-13 ENCOUNTER — Other Ambulatory Visit: Payer: Self-pay

## 2022-05-13 DIAGNOSIS — W010XXA Fall on same level from slipping, tripping and stumbling without subsequent striking against object, initial encounter: Secondary | ICD-10-CM | POA: Insufficient documentation

## 2022-05-13 DIAGNOSIS — Z79899 Other long term (current) drug therapy: Secondary | ICD-10-CM | POA: Insufficient documentation

## 2022-05-13 DIAGNOSIS — Y92481 Parking lot as the place of occurrence of the external cause: Secondary | ICD-10-CM | POA: Insufficient documentation

## 2022-05-13 DIAGNOSIS — I110 Hypertensive heart disease with heart failure: Secondary | ICD-10-CM | POA: Insufficient documentation

## 2022-05-13 DIAGNOSIS — S0003XA Contusion of scalp, initial encounter: Secondary | ICD-10-CM | POA: Insufficient documentation

## 2022-05-13 DIAGNOSIS — Z7982 Long term (current) use of aspirin: Secondary | ICD-10-CM | POA: Insufficient documentation

## 2022-05-13 DIAGNOSIS — I509 Heart failure, unspecified: Secondary | ICD-10-CM | POA: Insufficient documentation

## 2022-05-13 LAB — BASIC METABOLIC PANEL
Anion gap: 12 (ref 5–15)
BUN: 12 mg/dL (ref 6–20)
CO2: 23 mmol/L (ref 22–32)
Calcium: 8.5 mg/dL — ABNORMAL LOW (ref 8.9–10.3)
Chloride: 104 mmol/L (ref 98–111)
Creatinine, Ser: 1.03 mg/dL (ref 0.61–1.24)
GFR, Estimated: >60 mL/min (ref >60)
Glucose, Bld: 114 mg/dL — ABNORMAL HIGH (ref 70–99)
Potassium: 2.7 mmol/L — CL (ref 3.5–5.1)
Sodium: 139 mmol/L (ref 135–145)

## 2022-05-13 LAB — CBC
HCT: 38.9 % — ABNORMAL LOW (ref 39.0–52.0)
Hemoglobin: 12.7 g/dL — ABNORMAL LOW (ref 13.0–17.0)
MCH: 30.2 pg (ref 26.0–34.0)
MCHC: 32.6 g/dL (ref 30.0–36.0)
MCV: 92.6 fL (ref 80.0–100.0)
Platelets: 211 10*3/uL (ref 150–400)
RBC: 4.2 MIL/uL — ABNORMAL LOW (ref 4.22–5.81)
RDW: 13.3 % (ref 11.5–15.5)
WBC: 7.8 10*3/uL (ref 4.0–10.5)
nRBC: 0 % (ref 0.0–0.2)

## 2022-05-13 LAB — TROPONIN I (HIGH SENSITIVITY)
Troponin I (High Sensitivity): 4 ng/L (ref <18)
Troponin I (High Sensitivity): 5 ng/L (ref <18)

## 2022-05-13 LAB — ETHANOL: Alcohol, Ethyl (B): 409 mg/dL (ref <10)

## 2022-05-13 MED ORDER — KETOROLAC TROMETHAMINE 30 MG/ML IJ SOLN
30.0000 mg | Freq: Once | INTRAMUSCULAR | Status: AC
  Administered 2022-05-13: 30 mg via INTRAVENOUS
  Filled 2022-05-13: qty 1

## 2022-05-13 MED ORDER — POTASSIUM CHLORIDE CRYS ER 20 MEQ PO TBCR
40.0000 meq | EXTENDED_RELEASE_TABLET | Freq: Once | ORAL | Status: AC
  Administered 2022-05-13: 40 meq via ORAL
  Filled 2022-05-13: qty 2

## 2022-05-13 MED ORDER — ACETAMINOPHEN 500 MG PO TABS
1000.0000 mg | ORAL_TABLET | Freq: Once | ORAL | Status: AC
  Administered 2022-05-13: 1000 mg via ORAL
  Filled 2022-05-13: qty 2

## 2022-05-13 NOTE — Discharge Instructions (Signed)
Evaluation today for your fall while intoxicated was overall reassuring.  CT scans were negative for acute injury.  You do have an obvious large hematoma or bruise in the back your scalp.  Should clear with time.  Advised that you follow-up with your PCP for reevaluation.  Should you start to experience facial droop, slurred speech, changes in your gait, weakness or numbness in your extremities or any other concerning symptom please return to the emergency department further evaluation.

## 2022-05-13 NOTE — ED Triage Notes (Incomplete)
Patient arrives via EMS after fall hitting his head on concrete. He is on blood thinners an dalcohol is on board

## 2022-05-13 NOTE — ED Provider Notes (Signed)
[place] EMERGENCY DEPARTMENT AT [facility] Provider Note   CSN: [id number] Arrival date & time: 05/13/22  1605     History  Chief Complaint  Patient presents with   Fall   HPI Joshua Mayo is a 47 y.o. male with history of CHF, seizures and hypertension presenting for fall and head injury.  Patient states that he was at a bar drinking some margaritas and beer to celebrate his upcoming move to [place].  States he walked out of [employer] and laid down on the grass outside of the restaurant.  Per EMS, police was called to the scene and patient was awoken and started to run down through the parking lot when he tripped and fell and hit the back of his head.  Denies loss of consciousness.  Patient does take aspirin but denies any other blood thinners.  Patient states he is compliant taking his Keppra every day.  Denies chest pain shortness of breath.  States the only thing that hurts at this time is the "knot" in the back of his head.   Fall       Home Medications Prior to Admission medications   Medication Sig Start Date End Date Taking? Authorizing Provider  aspirin EC 81 MG tablet Take 1 tablet (81 mg total) by mouth daily. Swallow whole. 04/15/22   Jacky Kindle, FNP  carvedilol (COREG) 25 MG tablet Take 1 tablet (25 mg total) by mouth 2 (two) times daily with a meal. 04/15/22   Jacky Kindle, FNP  ENTRESTO 24-26 MG Take 1 tablet by mouth 2 (two) times daily. 04/15/22   Jacky Kindle, FNP  escitalopram (LEXAPRO) 20 MG tablet Take 1 tablet (20 mg total) by mouth daily. 04/15/22   Jacky Kindle, FNP  levETIRAcetam (KEPPRA) 500 MG tablet Take 1 tablet (500 mg total) by mouth 2 (two) times daily. 04/15/22   Jacky Kindle, FNP  rosuvastatin (CRESTOR) 40 MG tablet Take 1 tablet (40 mg total) by mouth daily. 04/15/22   Jacky Kindle, FNP  venlafaxine (EFFEXOR) 37.5 MG tablet Take 1 tablet (37.5 mg total) by mouth 2 (two) times daily with a meal. 04/15/22   Jacky Kindle,  FNP      Allergies    Banana    Review of Systems   See HPI for pertinent positives   Physical Exam   Vitals:   05/13/22 1615  BP: (!) 134/95  Pulse: 99  Resp: 20  Temp: 98.1 F (36.7 C)  SpO2: 96%    CONSTITUTIONAL:  well-appearing, NAD NEURO:  GCS 15. Speech is goal oriented. No deficits appreciated to CN III-XII; symmetric eyebrow raise, no facial drooping, tongue midline. Patient has equal grip strength bilaterally with 5/5 strength against resistance in all major muscle groups bilaterally. Sensation to light touch intact. Patient moves extremities without ataxia. Normal finger-nose-finger. Patient ambulatory with steady gait. Head: Large hematoma noted to the left posterior scalp, no Battle sign, raccoon eyes or rhinorrhea EYES:  eyes equal and reactive ENT/NECK:  Supple, no stridor, no tongue biting CARDIO:  regular rhythm, appears well-perfuse PULM:  No respiratory distress, CTAB GI/GU:  non-distended, CTAB MSK/SPINE:  No gross deformities, no edema, moves all extremities, not tremulous SKIN:  no rash, atraumatic   *Additional and/or pertinent findings included in MDM below    ED Results / Procedures / Treatments   Labs (all labs ordered are listed, but only abnormal results are displayed) Labs Reviewed  BASIC METABOLIC PANEL -  Abnormal; Notable for the following components:      Result Value   Potassium 2.7 (*)    Glucose, Bld 114 (*)    Calcium 8.5 (*)    All other components within normal limits  CBC - Abnormal; Notable for the following components:   RBC 4.20 (*)    Hemoglobin 12.7 (*)    HCT 38.9 (*)    All other components within normal limits  ETHANOL - Abnormal; Notable for the following components:   Alcohol, Ethyl (B) 409 (*)    All other components within normal limits  TROPONIN I (HIGH SENSITIVITY)  TROPONIN I (HIGH SENSITIVITY)    EKG None  Radiology CT Head Wo Contrast  Result Date: 05/13/2022 CLINICAL DATA:  Blunt polytrauma, fall,  ETOH. EXAM: CT HEAD WITHOUT CONTRAST CT CERVICAL SPINE WITHOUT CONTRAST TECHNIQUE: Multidetector CT imaging of the head and cervical spine was performed following the standard protocol without intravenous contrast. Multiplanar CT image reconstructions of the cervical spine were also generated. RADIATION DOSE REDUCTION: This exam was performed according to the departmental dose-optimization program which includes automated exposure control, adjustment of the mA and/or kV according to patient size and/or use of iterative reconstruction technique. COMPARISON:  None Available. FINDINGS: CT HEAD FINDINGS Brain: No evidence of acute infarction, hemorrhage, hydrocephalus, extra-axial collection or mass lesion/mass effect. Vascular: No hyperdense vessel or unexpected calcification. 1 minute Skull: Left parietal/occipital scalp hematoma measuring at least 4.3 x 1.1 by 6.3 cm. No calvarial fracture Sinuses/Orbits: No acute finding. Other: None. CT CERVICAL SPINE FINDINGS Alignment: Straightening of the cervical spine. Skull base and vertebrae: No acute fracture. No primary bone lesion or focal pathologic process. Soft tissues and spinal canal: No prevertebral fluid or swelling. No visible canal hematoma. Disc levels: Disc heights are maintained. No significant disc bulge, spinal canal or neural foraminal stenosis. Upper chest: Negative. Other: None IMPRESSION: CT head: 1. No acute intracranial abnormality. 2. Left parietal/occipital scalp hematoma measuring at least 4.3 x 1.1 x 6.3 cm. No calvarial fracture. CT cervical spine: 1. No acute cervical spine fracture or traumatic subluxation. Electronically Signed   By: Larose Hires D.O.   On: 05/13/2022 17:57   CT Cervical Spine Wo Contrast  Result Date: 05/13/2022 CLINICAL DATA:  Blunt polytrauma, fall, ETOH. EXAM: CT HEAD WITHOUT CONTRAST CT CERVICAL SPINE WITHOUT CONTRAST TECHNIQUE: Multidetector CT imaging of the head and cervical spine was performed following the standard  protocol without intravenous contrast. Multiplanar CT image reconstructions of the cervical spine were also generated. RADIATION DOSE REDUCTION: This exam was performed according to the departmental dose-optimization program which includes automated exposure control, adjustment of the mA and/or kV according to patient size and/or use of iterative reconstruction technique. COMPARISON:  None Available. FINDINGS: CT HEAD FINDINGS Brain: No evidence of acute infarction, hemorrhage, hydrocephalus, extra-axial collection or mass lesion/mass effect. Vascular: No hyperdense vessel or unexpected calcification. 1 minute Skull: Left parietal/occipital scalp hematoma measuring at least 4.3 x 1.1 by 6.3 cm. No calvarial fracture Sinuses/Orbits: No acute finding. Other: None. CT CERVICAL SPINE FINDINGS Alignment: Straightening of the cervical spine. Skull base and vertebrae: No acute fracture. No primary bone lesion or focal pathologic process. Soft tissues and spinal canal: No prevertebral fluid or swelling. No visible canal hematoma. Disc levels: Disc heights are maintained. No significant disc bulge, spinal canal or neural foraminal stenosis. Upper chest: Negative. Other: None IMPRESSION: CT head: 1. No acute intracranial abnormality. 2. Left parietal/occipital scalp hematoma measuring at least 4.3 x 1.1 x 6.3  cm. No calvarial fracture. CT cervical spine: 1. No acute cervical spine fracture or traumatic subluxation. Electronically Signed   By: Larose Hires D.O.   On: 05/13/2022 17:57   DG Chest Portable 1 View  Result Date: 05/13/2022 CLINICAL DATA:  Loss of consciousness.  Altered mental status EXAM: PORTABLE CHEST 1 VIEW COMPARISON:  X-ray 01/15/2021. FINDINGS: Mild linear opacity lung bases. Atelectasis is favored. No consolidation, pneumothorax, effusion or edema. Normal cardiopericardial silhouette. Overlapping cardiac leads. IMPRESSION: Bandlike changes along the lung bases. Atelectasis is favored. Recommend  follow-up. Electronically Signed   By: Karen Kays M.D.   On: 05/13/2022 17:33    Procedures Procedures    Medications Ordered in ED Medications  acetaminophen (TYLENOL) tablet 1,000 mg (1,000 mg Oral Given 05/13/22 1707)  potassium chloride SA (KLOR-CON M) CR tablet 40 mEq (40 mEq Oral Given 05/13/22 1813)  ketorolac (TORADOL) 30 MG/ML injection 30 mg (30 mg Intravenous Given 05/13/22 1930)    ED Course/ Medical Decision Making/ A&P                             Medical Decision Making Amount and/or Complexity of Data Reviewed Labs: ordered. Radiology: ordered.  Risk OTC drugs. Prescription drug management.   Initial Impression and Ddx 47 year old male is well-appearing presenting for mechanical fall while oxidated.  Exam notable for large hematoma to the posterior scalp.  DDx includes skull fracture, traumatic neck injury ICH, seizure, electrolyte derangement, arrhythmia. Patient PMH that increases complexity of ED encounter:  CHF, seizures and hypertension  Interpretation of Diagnostics I independent reviewed and interpreted the labs as followed: Hypokalemia  - I independently visualized the following imaging with scope of interpretation limited to determining acute life threatening conditions related to emergency care: CT scans and x-rays did not reveal acute findings aside from large posterior hematoma about the scalp  - I personally reviewed interpret EKG which revealed sinus rhythm Patient Reassessment and Ultimate Disposition/Management Patient appeared intoxicated on arrival with clear trauma to the posterior scalp.  Fall is likely mechanical.  History not consistent with seizure-like activity.  Fortunately CT scans were negative.  Treated pain with Tylenol and Toradol.  Status pain in the back of his head has improved.  Treat his hypokalemia with oral potassium advised to continue to replete his potassium stores at home with potassium rich foods.  Suspect that will normalize  as he metabolizes the alcohol in his blood.  Advised follow-up with PCP.  Vital stable discharge.  Patient management required discussion with the following services or consulting groups:  None  Complexity of Problems Addressed Acute complicated illness or Injury  Additional Data Reviewed and Analyzed Further history obtained from: EMS on arrival and Past medical history and medications listed in the EMR  Patient Encounter Risk Assessment None          Final Clinical Impression(s) / ED Diagnoses Final diagnoses:  Hematoma of scalp, initial encounter    Rx / DC Orders ED Discharge Orders     None         Gareth Eagle, PA-C 05/13/22 2017    Benjiman Core, MD 05/14/22 0010

## 2022-05-13 NOTE — ED Notes (Signed)
K+ 2.7 Roxan Hockey, [place] made aware.

## 2022-08-03 ENCOUNTER — Other Ambulatory Visit: Payer: Self-pay | Admitting: Family Medicine

## 2022-08-03 DIAGNOSIS — I426 Alcoholic cardiomyopathy: Secondary | ICD-10-CM

## 2022-08-03 DIAGNOSIS — R569 Unspecified convulsions: Secondary | ICD-10-CM

## 2022-08-03 NOTE — Telephone Encounter (Signed)
Requested Prescriptions  Pending Prescriptions Disp Refills   levETIRAcetam (KEPPRA) 500 MG tablet [Pharmacy Med Name: LEVETIRACETAM 500 MG TABLET] 180 tablet 0    Sig: TAKE 1 TABLET BY MOUTH TWICE A DAY     Neurology:  Anticonvulsants - levetiracetam Passed - 08/03/2022  8:29 AM      Passed - Cr in normal range and within 360 days    Creatinine, Ser  Date Value Ref Range Status  05/13/2022 1.03 0.61 - 1.24 mg/dL Final         Passed - Completed PHQ-2 or PHQ-9 in the last 360 days      Passed - Valid encounter within last 12 months    Recent Outpatient Visits           3 months ago Chronic systolic CHF (congestive heart failure) [facility])   [place] [facility] Merita Norton T, FNP   1 year ago AKI (acute kidney injury) [facility])   [place] [facility] Caro Laroche, DO   1 year ago 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

## 2022-08-17 ENCOUNTER — Other Ambulatory Visit: Payer: Self-pay | Admitting: Family Medicine

## 2022-08-17 DIAGNOSIS — F411 Generalized anxiety disorder: Secondary | ICD-10-CM

## 2022-09-13 ENCOUNTER — Other Ambulatory Visit: Payer: Self-pay | Admitting: Family Medicine

## 2022-09-13 DIAGNOSIS — I426 Alcoholic cardiomyopathy: Secondary | ICD-10-CM

## 2022-09-13 DIAGNOSIS — R569 Unspecified convulsions: Secondary | ICD-10-CM

## 2022-09-15 NOTE — Telephone Encounter (Signed)
Requested Prescriptions  Pending Prescriptions Disp Refills   levETIRAcetam (KEPPRA) 500 MG tablet [Pharmacy Med Name: LEVETIRACETAM 500 MG TABLET] 180 tablet 0    Sig: TAKE 1 TABLET BY MOUTH TWICE A DAY     Neurology:  Anticonvulsants - levetiracetam Passed - 09/13/2022  6:02 PM      Passed - Cr in normal range and within 360 days    Creatinine, Ser  Date Value Ref Range Status  05/13/2022 1.03 0.61 - 1.24 mg/dL Final         Passed - Completed PHQ-2 or PHQ-9 in the last 360 days      Passed - Valid encounter within last 12 months    Recent Outpatient Visits           5 months ago Chronic systolic CHF (congestive heart failure) [facility])   [place] [facility] Merita Norton T, FNP   1 year ago AKI (acute kidney injury) [facility])   [place] [facility] Caro Laroche, DO   1 year 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

## 2022-11-07 IMAGING — CT CT ANGIO HEAD
1 of 11 series · 5 of 33 positions shown · IV contrast (APPLIED)
Comparison: Recent CT and MR imaging

CLINICAL DATA: Stroke, follow up Neuro deficit, acute, stroke
suspected; remotes stroke on MRI;

EXAM:
CT ANGIOGRAPHY HEAD AND NECK
TECHNIQUE: Multidetector CT imaging of the head and neck was performed using
the standard protocol during bolus administration of intravenous
contrast. Multiplanar CT image reconstructions and MIPs were
obtained to evaluate the vascular anatomy. Carotid stenosis
measurements (when applicable) are obtained utilizing NASCET
criteria, using the distal internal carotid diameter as the
denominator.

[Series 12: ax thin · axial · 0.38mm/px · z∈[-322,-88]mm · 5 of 354 slices shown]
[im 59/354  soft-tissue]
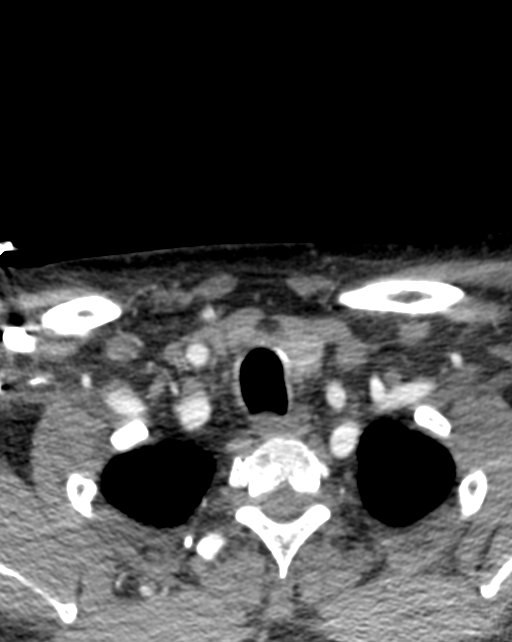
[im 118/354  bone]
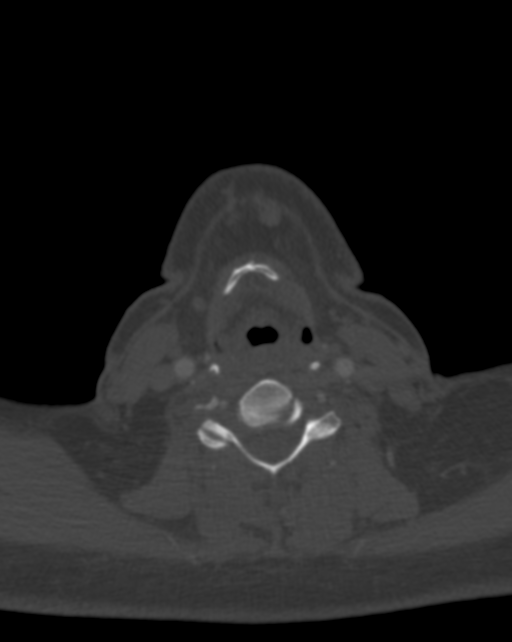
[im 177/354  soft-tissue]
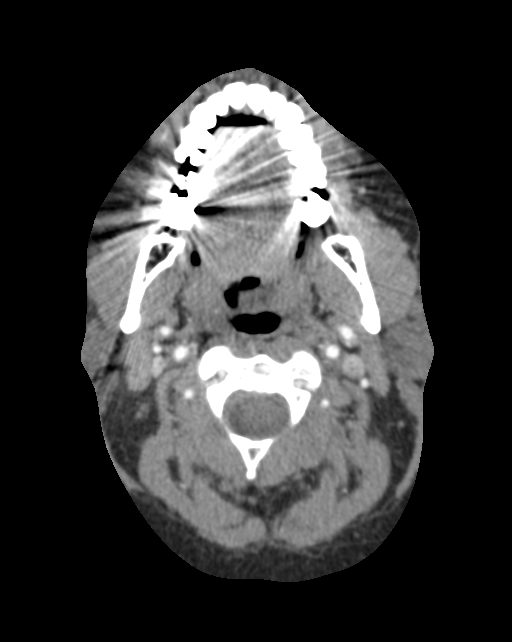
[im 236/354  bone]
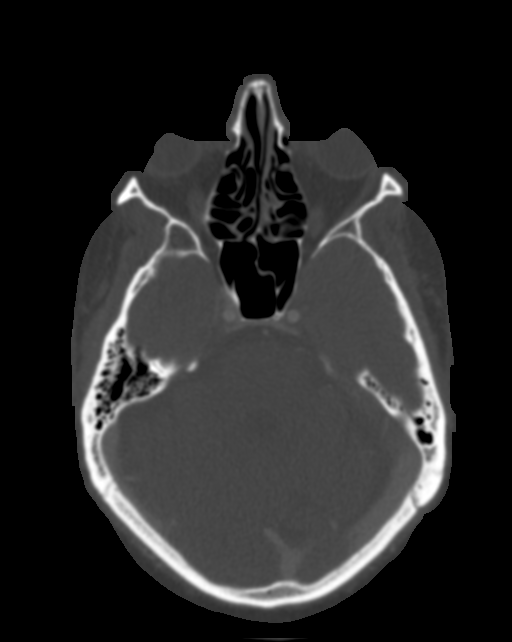
[im 295/354  soft-tissue]
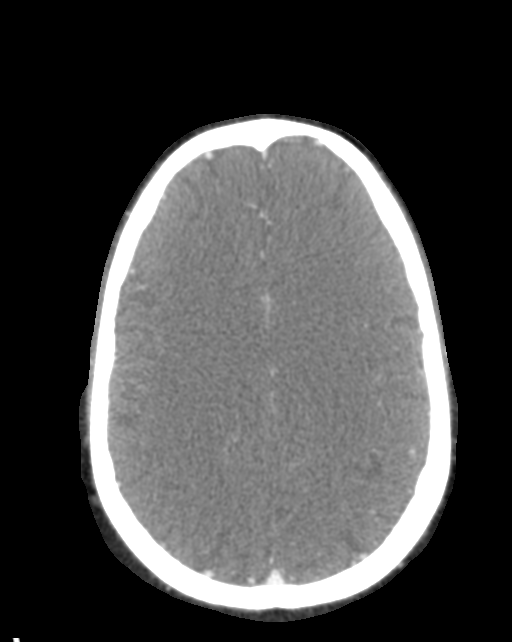

[5 of 33 positions shown; findings below may reference images not displayed]

RADIATION DOSE REDUCTION: This exam was performed according to the
departmental dose-optimization program which includes automated
exposure control, adjustment of the mA and/or kV according to
patient size and/or use of iterative reconstruction technique.

CONTRAST:  75mL OMNIPAQUE IOHEXOL 350 MG/ML SOLN
FINDINGS: CT HEAD

Brain: There is no acute intracranial hemorrhage, mass effect, or
edema. No new loss of gray-white differentiation. Small chronic
infarct right occipital lobe. Small chronic superior cerebellar
infarcts. No extra-axial collection. Ventricles and sulci are normal
in size and configuration.

Vascular: Better evaluated on CTA portion.

Skull: Unremarkable.

Sinuses/Orbits: No acute finding.

Other: None.

Review of the MIP images confirms the above findings

CTA NECK

Aortic arch: Great vessel origins are patent.

Right carotid system: Patent. Minimal calcified plaque at the ICA
origin without stenosis.

Left carotid system: Patent. Minimal calcified plaque at the ICA
origin without stenosis.

Vertebral arteries: Patent and codominant.  No stenosis.

Skeleton: No significant abnormality.

Other neck: Unremarkable.

Upper chest: No apical lung mass.

Review of the MIP images confirms the above findings

CTA HEAD

Anterior circulation: Intracranial internal carotid arteries are
patent. Anterior and middle cerebral arteries are patent. Anterior
communicating artery is present.

Posterior circulation: Intracranial vertebral arteries are patent.
Basilar artery is patent. Major cerebellar artery origins are
patent. Bilateral posterior communicating arteries are present.
There is essentially fetal origin of the patent posterior cerebral
arteries with bilateral hypoplastic P1 segments.

Venous sinuses: Patent as allowed by contrast bolus timing.

Review of the MIP images confirms the above findings
IMPRESSION: No acute intracranial abnormality.

No large vessel occlusion, hemodynamically significant stenosis, or
evidence of dissection.

## 2022-12-19 ENCOUNTER — Other Ambulatory Visit: Payer: Self-pay | Admitting: Family Medicine

## 2022-12-19 DIAGNOSIS — R569 Unspecified convulsions: Secondary | ICD-10-CM

## 2022-12-19 DIAGNOSIS — I426 Alcoholic cardiomyopathy: Secondary | ICD-10-CM

## 2022-12-21 NOTE — Telephone Encounter (Signed)
Requested medications are due for refill today.  yes  Requested medications are on the active medications list.  yes  Last refill. 09/15/2022 #180 0 rf  Future visit scheduled.   no  Notes to clinic.  Request to be sent to a pharmacy in [place]    Requested Prescriptions  Pending Prescriptions Disp Refills   levETIRAcetam (KEPPRA) 500 MG tablet [Pharmacy Med Name: LEVETIRACETAM 500 MG TABLET] 180 tablet 0    Sig: TAKE 1 TABLET BY MOUTH TWICE A DAY     Neurology:  Anticonvulsants - levetiracetam Passed - 12/21/2022  8:27 AM      Passed - Cr in normal range and within 360 days    Creatinine, Ser  Date Value Ref Range Status  05/13/2022 1.03 0.61 - 1.24 mg/dL Final         Passed - Completed PHQ-2 or PHQ-9 in the last 360 days      Passed - Valid encounter within last 12 months    Recent Outpatient Visits           8 months ago Chronic systolic CHF (congestive heart failure) [facility])   [facility] Merita Norton T, FNP   1 year ago AKI (acute kidney injury) [facility])   [place] [facility] Caro Laroche, DO   1 year 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

## 2023-01-06 ENCOUNTER — Other Ambulatory Visit: Payer: Self-pay | Admitting: Family Medicine

## 2023-01-06 DIAGNOSIS — F411 Generalized anxiety disorder: Secondary | ICD-10-CM

## 2023-01-06 NOTE — Telephone Encounter (Signed)
**Note De-identified  [place] Obfuscation** Please advise 

## 2023-01-18 ENCOUNTER — Telehealth: Payer: Self-pay | Admitting: Family Medicine

## 2023-01-18 NOTE — Telephone Encounter (Signed)
 [facility] is requesting prescription refill levETIRAcetam (KEPPRA) 500 MG tablet   Please advise

## 2023-01-19 ENCOUNTER — Other Ambulatory Visit: Payer: Self-pay | Admitting: Family Medicine

## 2023-01-19 DIAGNOSIS — F411 Generalized anxiety disorder: Secondary | ICD-10-CM

## 2023-01-19 DIAGNOSIS — I5022 Chronic systolic (congestive) heart failure: Secondary | ICD-10-CM

## 2023-01-19 DIAGNOSIS — I426 Alcoholic cardiomyopathy: Secondary | ICD-10-CM

## 2023-01-19 DIAGNOSIS — R569 Unspecified convulsions: Secondary | ICD-10-CM

## 2023-01-19 MED ORDER — LEVETIRACETAM 500 MG PO TABS: 500.0000 mg | ORAL_TABLET | Freq: Two times a day (BID) | ORAL | 0 refills | Status: DC

## 2023-01-19 NOTE — Telephone Encounter (Signed)
 Medication Refill -  Most Recent Primary Care Visit:  Provider: PAYNE, ELISE T  Department: [id number]  Visit Type: MYCHART VIDEO VISIT  Date: 04/15/2022  Medication: escitalopram  (LEXAPRO ) 20 MG tablet, rosuvastatin  (CRESTOR ) 40 MG tablet, venlafaxine  (EFFEXOR ) 37.5 MG tablet and levETIRAcetam  (KEPPRA ) 500 MG tablet   Has the patient contacted their pharmacy? No  Is this the correct pharmacy for this prescription? Yes  This is the patient's preferred pharmacy:   [phone] [place], [place] - [address] [place] [address] Phone: [phone] Fax: [phone]  Has the prescription been filled recently? Yes  Is the patient out of the medication? Yes  Has the patient been seen for an appointment in the last year OR does the patient have an upcoming appointment? Yes  Can we respond through MyChart? No  Agent: Please be advised that Rx refills may take up to 3 business days. We ask that you follow-up with your pharmacy.

## 2023-01-19 NOTE — Telephone Encounter (Signed)
 Courtesy refill sent. Patient needs appt

## 2023-01-20 ENCOUNTER — Telehealth: Payer: Self-pay

## 2023-01-20 MED ORDER — ROSUVASTATIN CALCIUM 40 MG PO TABS: 40.0000 mg | ORAL_TABLET | Freq: Every day | ORAL | 0 refills | Status: AC

## 2023-01-20 NOTE — Telephone Encounter (Signed)
Requested by patient . No future visit at this time.  Requested Prescriptions  Pending Prescriptions Disp Refills   escitalopram (LEXAPRO) 20 MG tablet 90 tablet 0    Sig: Take 1 tablet (20 mg total) by mouth daily.     Psychiatry:  Antidepressants - SSRI Failed - 01/20/2023 11:42 AM      Failed - Valid encounter within last 6 months    Recent Outpatient Visits           9 months ago Chronic systolic CHF (congestive heart failure) [facility])   [facility] Merita Norton T, FNP   1 year ago AKI (acute kidney injury) [facility])   [place] [facility] Caro Laroche, DO   1 year ago Alcoholic cardiomyopathy [facility])   [place] [facility] Caro Laroche, DO   2 years ago Generalized anxiety disorder   [place] [facility] Chrismon, Jodell Cipro, PA-C   2 years ago Hospital discharge follow-up   [place] [facility] Just, Azalee Course, FNP               levETIRAcetam (KEPPRA) 500 MG tablet 120 tablet 0    Sig: Take 1 tablet (500 mg total) by mouth 2 (two) times daily.     Neurology:  Anticonvulsants - levetiracetam Passed - 01/20/2023 11:42 AM      Passed - Cr in normal range and within 360 days    Creatinine, Ser  Date Value Ref Range Status  05/13/2022 1.03 0.61 - 1.24 mg/dL Final         Passed - Completed PHQ-2 or PHQ-9 in the last 360 days      Passed - Valid encounter within last 12 months    Recent Outpatient Visits           9 months ago Chronic systolic CHF (congestive heart failure) [facility])   [place] [facility] Merita Norton T, FNP   1 year ago AKI (acute kidney injury) [facility])   [place] [facility] Caro Laroche, DO   1 year ago Alcoholic cardiomyopathy [facility])   [place] [facility] Caro Laroche, DO   2 years ago Generalized anxiety disorder   [place] [facility] Chrismon, Jodell Cipro, PA-C   2 years  ago Hospital discharge follow-up   [place] [facility] Just, Azalee Course, FNP               rosuvastatin (CRESTOR) 40 MG tablet 90 tablet 0    Sig: Take 1 tablet (40 mg total) by mouth daily.     Cardiovascular:  Antilipid - Statins 2 Failed - 01/20/2023 11:42 AM      Failed - Lipid Panel in normal range within the last 12 months    Cholesterol, Total  Date Value Ref Range Status  01/12/2016 232 (H) 100 - 199 mg/dL Final   Cholesterol  Date Value Ref Range Status  01/16/2021 195 0 - 200 mg/dL Final   LDL Calculated  Date Value Ref Range Status  01/12/2016 144 (H) 0 - 99 mg/dL Final   LDL Cholesterol  Date Value Ref Range Status  01/16/2021 127 (H) 0 - 99 mg/dL Final    Comment:           Total Cholesterol/HDL:CHD Risk Coronary Heart Disease Risk Table  Men   Women  1/2 Average Risk   3.4   3.3  Average Risk       5.0   4.4  2 X Average Risk   9.6   7.1  3 X Average Risk  23.4   11.0        Use the calculated Patient Ratio above and the CHD Risk Table to determine the patient's CHD Risk.        ATP III CLASSIFICATION (LDL):  <100     mg/dL   Optimal  962-952  mg/dL   Near or Above                    Optimal  130-159  mg/dL   Borderline  841-324  mg/dL   High  >401     mg/dL   Very High Performed at [facility], [address] [place], [place] [address]    HDL  Date Value Ref Range Status  01/16/2021 56 >40 mg/dL Final  36/64/4034 77 >74 mg/dL Final   Triglycerides  Date Value Ref Range Status  01/16/2021 61 <150 mg/dL Final         Passed - Cr in normal range and within 360 days    Creatinine, Ser  Date Value Ref Range Status  05/13/2022 1.03 0.61 - 1.24 mg/dL Final         Passed - Patient is not pregnant      Passed - Valid encounter within last 12 months    Recent Outpatient Visits           9 months ago Chronic systolic CHF (congestive heart failure) (HCC)   [place] [facility] Merita Norton T, FNP   1 year ago AKI (acute kidney injury) [facility])   [place] [facility] Caro Laroche, DO   1 year ago Alcoholic cardiomyopathy [facility])   [place] [facility] Caro Laroche, DO   2 years ago Generalized anxiety disorder   [place] [facility] Chrismon, Jodell Cipro, PA-C   2 years ago Hospital discharge follow-up   [facility] Just, Azalee Course, FNP               venlafaxine (EFFEXOR) 37.5 MG tablet 180 tablet 0    Sig: Take 1 tablet (37.5 mg total) by mouth 2 (two) times daily with a meal.     Psychiatry: Antidepressants - SNRI - desvenlafaxine & venlafaxine Failed - 01/20/2023 11:42 AM      Failed - Last BP in normal range    BP Readings from Last 1 Encounters:  05/13/22 (!) 130/93         Failed - Valid encounter within last 6 months    Recent Outpatient Visits           9 months ago Chronic systolic CHF (congestive heart failure) [facility])   [place] [facility] Merita Norton T, FNP   1 year ago AKI (acute kidney injury) [facility])   [place] [facility] Caro Laroche, DO   1 year ago Alcoholic cardiomyopathy [facility])   [place] [facility] Caro Laroche, DO   2 years ago Generalized anxiety disorder   [place] [facility] Chrismon, Jodell Cipro, PA-C   2 years ago Hospital discharge follow-up   [facility] Just, Azalee Course, FNP  Failed - Lipid Panel in normal range within the last 12 months    Cholesterol, Total  Date Value Ref Range Status  01/12/2016 232 (H) 100 - 199 mg/dL Final   Cholesterol  Date Value Ref Range Status  01/16/2021 195 0 - 200 mg/dL Final   LDL Calculated  Date Value Ref Range Status  01/12/2016 144 (H) 0 - 99 mg/dL Final   LDL Cholesterol  Date Value Ref Range Status  01/16/2021 127 (H) 0 - 99 mg/dL Final    Comment:            Total Cholesterol/HDL:CHD Risk Coronary Heart Disease Risk Table                     Men   Women  1/2 Average Risk   3.4   3.3  Average Risk       5.0   4.4  2 X Average Risk   9.6   7.1  3 X Average Risk  23.4   11.0        Use the calculated Patient Ratio above and the CHD Risk Table to determine the patient's CHD Risk.        ATP III CLASSIFICATION (LDL):  <100     mg/dL   Optimal  025-427  mg/dL   Near or Above                    Optimal  130-159  mg/dL   Borderline  062-376  mg/dL   High  >283     mg/dL   Very High Performed at [facility], [address] [place], [place] [address]    HDL  Date Value Ref Range Status  01/16/2021 56 >40 mg/dL Final  16/07/3708 77 >62 mg/dL Final   Triglycerides  Date Value Ref Range Status  01/16/2021 61 <150 mg/dL Final         Passed - Cr in normal range and within 360 days    Creatinine, Ser  Date Value Ref Range Status  05/13/2022 1.03 0.61 - 1.24 mg/dL Final

## 2023-01-20 NOTE — Telephone Encounter (Signed)
Patient will need in office visit for additional refills. We can send one month supply of courtesy refills until he is able to be seen in the office and have updated labs

## 2023-01-20 NOTE — Telephone Encounter (Signed)
Requested medication (s) are due for refill today: na   Requested medication (s) are on the active medication list: yes   Last refill:  lexapro 08/17/22 #90 0 refills, keppra- 01/19/23 #120 0 refills, effexor- 04/15/22 #180 0 refills  Future visit scheduled: no  Notes to clinic:  last OV 04/15/22. Called patient to schedule appt no answer. LVMTCB. Do you want to allow courtesy refill for #30 or #90?     Requested Prescriptions  Pending Prescriptions Disp Refills   escitalopram (LEXAPRO) 20 MG tablet 90 tablet 0    Sig: Take 1 tablet (20 mg total) by mouth daily.     Psychiatry:  Antidepressants - SSRI Failed - 01/20/2023 11:48 AM      Failed - Valid encounter within last 6 months    Recent Outpatient Visits           9 months ago Chronic systolic CHF (congestive heart failure) [facility])   [place] [facility] Merita Norton T, FNP   1 year ago AKI (acute kidney injury) [facility])   [place] [facility] Caro Laroche, DO   1 year ago Alcoholic cardiomyopathy [facility])   [place] [facility] Car[facility] Laroche, DO   2 years ago Generalized anxiety disorder   [place] [facility] Chrismon, Jodell Cipro, PA-C   2 years ago Hospital discharge follow-up   [place] [facility] Just, Azalee Course, FNP               levETIRAcetam (KEPPRA) 500 MG tablet 120 tablet 0    Sig: Take 1 tablet (500 mg total) by mouth 2 (two) times daily.     Neurology:  Anticonvulsants - levetiracetam Passed - 01/20/2023 11:48 AM      Passed - Cr in normal range and within 360 days    Creatinine, Ser  Date Value Ref Range Status  05/13/2022 1.03 0.61 - 1.24 mg/dL Final         Passed - Completed PHQ-2 or PHQ-9 in the last 360 days      Passed - Valid encounter within last 12 months    Recent Outpatient Visits           9 months ago Chronic systolic CHF (congestive heart failure) (HCC)   [place] [facility]  Merita Norton T, FNP   1 year ago AKI (acute kidney injury) [facility])   [place] [facility] Caro Laroche, DO   1 year ago Alcoholic cardiomyopathy [facility])   [place] [facility] Caro Laroche, DO   2 years ago Generalized anxiety disorder   [place] [facility] Chrismon, Jodell Cipro, PA-C   2 years ago Hospital discharge follow-up   [place] [facility] Just, Azalee Course, FNP               venlafaxine (EFFEXOR) 37.5 MG tablet 180 tablet 0    Sig: Take 1 tablet (37.5 mg total) by mouth 2 (two) times daily with a meal.     Psychiatry: Antidepressants - SNRI - desvenlafaxine & venlafaxine Failed - 01/20/2023 11:48 AM      Failed - Last BP in normal range    BP Readings from Last 1 Encounters:  05/13/22 (!) 130/93         Failed - Valid encounter within last 6 months    Recent Outpatient Visits           9 months  ago Chronic systolic CHF (congestive heart failure) (HCC)   [place] [facility] T, FNP   1 year ago AKI (acute kidney [facility]) [facility])   [place] [facility] Caro Laroche, DO   1 year ago Alcoholic cardiomyopathy [facility])   [place] [facility] Caro Laroche, DO   2 years ago Generalized anxiety disorder   [place] [facility] Chrismon, Jodell Cipro, PA-C   2 years ago Hospital discharge follow-up   [facility] Just, Azalee Course, FNP              Failed - Lipid Panel in normal range within the last 12 months    Cholesterol, Total  Date Value Ref Range Status  01/12/2016 232 (H) 100 - 199 mg/dL Final   Cholesterol  Date Value Ref Range Status  01/16/2021 195 0 - 200 mg/dL Final   LDL Calculated  Date Value Ref Range Status  01/12/2016 144 (H) 0 - 99 mg/dL Final   LDL Cholesterol  Date Value Ref Range Status  01/16/2021 127 (H) 0 - 99 mg/dL Final    Comment:           Total  Cholesterol/HDL:CHD Risk Coronary Heart Disease Risk Table                     Men   Women  1/2 Average Risk   3.4   3.3  Average Risk       5.0   4.4  2 X Average Risk   9.6   7.1  3 X Average Risk  23.4   11.0        Use the calculated Patient Ratio above and the CHD Risk Table to determine the patient's CHD Risk.        ATP III CLASSIFICATION (LDL):  <100     mg/dL   Optimal  086-578  mg/dL   Near or Above                    Optimal  130-159  mg/dL   Borderline  469-629  mg/dL   High  >528     mg/dL   Very High Performed at [facility], [address] [place], [place] [address]    HDL  Date Value Ref Range Status  01/16/2021 56 >40 mg/dL Final  40/10/2723 77 >36 mg/dL Final   Triglycerides  Date Value Ref Range Status  01/16/2021 61 <150 mg/dL Final         Passed - Cr in normal range and within 360 days    Creatinine, Ser  Date Value Ref Range Status  05/13/2022 1.03 0.61 - 1.24 mg/dL Final         Signed Prescriptions Disp Refills   rosuvastatin (CRESTOR) 40 MG tablet 90 tablet 0    Sig: Take 1 tablet (40 mg total) by mouth daily.     Cardiovascular:  Antilipid - Statins 2 Failed - 01/20/2023 11:48 AM      Failed - Lipid Panel in normal range within the last 12 months    Cholesterol, Total  Date Value Ref Range Status  01/12/2016 232 (H) 100 - 199 mg/dL Final   Cholesterol  Date Value Ref Range Status  01/16/2021 195 0 - 200 mg/dL Final   LDL Calculated  Date Value Ref Range Status  01/12/2016 144 (H) 0 - 99 mg/dL Final   LDL Cholesterol  Date Value Ref Range Status  01/16/2021 127 (H) 0 - 99 mg/dL Final    Comment:           Total Cholesterol/HDL:CHD Risk Coronary Heart Disease Risk Table                     Men   Women  1/2 Average Risk   3.4   3.3  Average Risk       5.0   4.4  2 X Average Risk   9.6   7.1  3 X Average Risk  23.4   11.0        Use the calculated Patient Ratio above and the CHD Risk Table to determine the  patient's CHD Risk.        ATP III CLASSIFICATION (LDL):  <100     mg/dL   Optimal  409-811  mg/dL   Near or Above                    Optimal  130-159  mg/dL   Borderline  914-782  mg/dL   High  >956     mg/dL   Very High Performed at [facility], [address] [place], [place] [address]    HDL  Date Value Ref Range Status  01/16/2021 56 >40 mg/dL Final  65/78/4696 77 >29 mg/dL Final   Triglycerides  Date Value Ref Range Status  01/16/2021 61 <150 mg/dL Final         Passed - Cr in normal range and within 360 days    Creatinine, Ser  Date Value Ref Range Status  05/13/2022 1.03 0.61 - 1.24 mg/dL Final         Passed - Patient is not pregnant      Passed - Valid encounter within last 12 months    Recent Outpatient Visits           9 months ago Chronic systolic CHF (congestive heart failure) [facility])   [place] [facility] Merita Norton T, FNP   1 year ago AKI (acute kidney injury) [facility])   [place] [facility] Caro Laroche, DO   1 year ago Alcoholic cardiomyopathy [facility])   [place] [facility] Caro Laroche, DO   2 years ago Generalized anxiety disorder    [facility] Chrismon, Jodell Cipro, PA-C   2 years ago Hospital discharge follow-up   [facility] Just, Azalee Course, FNP

## 2023-01-20 NOTE — Telephone Encounter (Signed)
Called patient to schedule appt for medication refills. No answer # [phone] . LVMTCB [phone]

## 2023-01-20 NOTE — Telephone Encounter (Signed)
This patient has not been seen in the office since 2023   Copied from CRM #[id number]. Topic: Appointments - Appointment Scheduling >> Jan 20, 2023 12:24 PM Phill Myron wrote: Pt Pribble is out of town and is out of medication. (It says he need to schedule an appointment for a refill).  He would like an video appointment sooner that February 3 with Dr Roxan Hockey. Please advise

## 2023-01-21 MED ORDER — VENLAFAXINE HCL 37.5 MG PO TABS: 37.5000 mg | ORAL_TABLET | Freq: Two times a day (BID) | ORAL | 0 refills | Status: AC

## 2023-01-21 MED ORDER — LEVETIRACETAM 500 MG PO TABS: 500.0000 mg | ORAL_TABLET | Freq: Two times a day (BID) | ORAL | 0 refills | Status: AC

## 2023-01-21 MED ORDER — ESCITALOPRAM OXALATE 20 MG PO TABS: 20.0000 mg | ORAL_TABLET | Freq: Every day | ORAL | 0 refills | Status: AC

## 2023-04-18 ENCOUNTER — Other Ambulatory Visit: Payer: Self-pay | Admitting: Family Medicine

## 2023-04-18 DIAGNOSIS — I5022 Chronic systolic (congestive) heart failure: Secondary | ICD-10-CM

## 2023-04-19 NOTE — Telephone Encounter (Signed)
 Requested medication (s) are due for refill today: Yes  Requested medication (s) are on the active medication list: Yes  Last refill:  01/20/23  Future visit scheduled: No  Notes to clinic:  Unable to refill per protocol due to failed labs, no updated results.      Requested Prescriptions  Pending Prescriptions Disp Refills   rosuvastatin (CRESTOR) 40 MG tablet [Pharmacy Med Name: ROSUVASTATIN CALCIUM 40 MG TAB] 90 tablet 0    Sig: TAKE 1 TABLET BY MOUTH EVERY DAY     Cardiovascular:  Antilipid - Statins 2 Failed - 04/19/2023 10:42 AM      Failed - Valid encounter within last 12 months    Recent Outpatient Visits   None            Failed - Lipid Panel in normal range within the last 12 months    Cholesterol, Total  Date Value Ref Range Status  01/12/2016 232 (H) 100 - 199 mg/dL Final   Cholesterol  Date Value Ref Range Status  01/16/2021 195 0 - 200 mg/dL Final   LDL Calculated  Date Value Ref Range Status  01/12/2016 144 (H) 0 - 99 mg/dL Final   LDL Cholesterol  Date Value Ref Range Status  01/16/2021 127 (H) 0 - 99 mg/dL Final    Comment:           Total Cholesterol/HDL:CHD Risk Coronary Heart Disease Risk Table                     Men   Women  1/2 Average Risk   3.4   3.3  Average Risk       5.0   4.4  2 X Average Risk   9.6   7.1  3 X Average Risk  23.4   11.0        Use the calculated Patient Ratio above and the CHD Risk Table to determine the patient's CHD Risk.        ATP III CLASSIFICATION (LDL):  <100     mg/dL   Optimal  409-811  mg/dL   Near or Above                    Optimal  130-159  mg/dL   Borderline  914-782  mg/dL   High  >956     mg/dL   Very High Performed at [facility], [address] [place], [place] [address]    HDL  Date Value Ref Range Status  01/16/2021 56 >40 mg/dL Final  65/78/4696 77 >29 mg/dL Final   Triglycerides  Date Value Ref Range Status  01/16/2021 61 <150 mg/dL Final         Passed - Cr in  normal range and within 360 days    Creatinine, Ser  Date Value Ref Range Status  05/13/2022 1.03 0.61 - 1.24 mg/dL Final         Passed - Patient is not pregnant

## 2023-04-19 NOTE — Telephone Encounter (Signed)
 Patient needs an appointment

## 2023-09-03 ENCOUNTER — Other Ambulatory Visit: Payer: Self-pay | Admitting: Family Medicine

## 2023-09-03 DIAGNOSIS — I426 Alcoholic cardiomyopathy: Secondary | ICD-10-CM

## 2023-09-03 DIAGNOSIS — R569 Unspecified convulsions: Secondary | ICD-10-CM

## 2023-09-05 NOTE — Telephone Encounter (Signed)
 Requested medication (s) are due for refill today: Yes  Requested medication (s) are on the active medication list: Yes  Last refill:  01/21/23  Future visit scheduled: No  Notes to clinic:  Routing to provider for approval of refill until seen by new PCP     Requested Prescriptions  Pending Prescriptions Disp Refills   levETIRAcetam  (KEPPRA ) 500 MG tablet [Pharmacy Med Name: LEVETIRACETAM  500 MG TABLET] 180 tablet 0    Sig: TAKE 1 TABLET BY MOUTH TWICE A DAY     Neurology:  Anticonvulsants - levetiracetam  Failed - 09/05/2023 10:07 AM      Failed - Cr in normal range and within 360 days    Creatinine, Ser  Date Value Ref Range Status  05/13/2022 1.03 0.61 - 1.24 mg/dL Final         Failed - Completed PHQ-2 or PHQ-9 in the last 360 days      Failed - Valid encounter within last 12 months    Recent Outpatient Visits   None
